# Patient Record
Sex: Female | Born: 1965
Health system: Southern US, Community
[De-identification: ages and names within clinical notes are randomized; demographics above are authoritative.]

## PROBLEM LIST (undated history)

## (undated) DIAGNOSIS — M1990 Unspecified osteoarthritis, unspecified site: Secondary | ICD-10-CM

## (undated) DIAGNOSIS — R51 Headache: Secondary | ICD-10-CM

## (undated) DIAGNOSIS — E785 Hyperlipidemia, unspecified: Secondary | ICD-10-CM

## (undated) DIAGNOSIS — T7840XA Allergy, unspecified, initial encounter: Secondary | ICD-10-CM

## (undated) HISTORY — DX: Allergy, unspecified, initial encounter: T78.40XA

## (undated) HISTORY — DX: Headache: R51

## (undated) HISTORY — DX: Hyperlipidemia, unspecified: E78.5

## (undated) HISTORY — DX: Unspecified osteoarthritis, unspecified site: M19.90

---

## 1998-10-23 ENCOUNTER — Ambulatory Visit (HOSPITAL_COMMUNITY): Admission: RE | Admit: 1998-10-23 | Discharge: 1998-10-23 | Payer: Self-pay | Admitting: *Deleted

## 1999-03-30 ENCOUNTER — Encounter (HOSPITAL_COMMUNITY): Admission: AD | Admit: 1999-03-30 | Discharge: 1999-04-07 | Payer: Self-pay | Admitting: *Deleted

## 1999-04-06 ENCOUNTER — Inpatient Hospital Stay (HOSPITAL_COMMUNITY): Admission: AD | Admit: 1999-04-06 | Discharge: 1999-04-06 | Payer: Self-pay | Admitting: *Deleted

## 1999-04-06 ENCOUNTER — Encounter: Payer: Self-pay | Admitting: *Deleted

## 1999-04-07 ENCOUNTER — Encounter (INDEPENDENT_AMBULATORY_CARE_PROVIDER_SITE_OTHER): Payer: Self-pay

## 1999-04-07 ENCOUNTER — Inpatient Hospital Stay (HOSPITAL_COMMUNITY): Admission: RE | Admit: 1999-04-07 | Discharge: 1999-04-09 | Payer: Self-pay | Admitting: Obstetrics & Gynecology

## 1999-04-13 ENCOUNTER — Encounter: Admission: RE | Admit: 1999-04-13 | Discharge: 1999-04-13 | Payer: Self-pay | Admitting: Obstetrics & Gynecology

## 1999-07-22 ENCOUNTER — Encounter: Admission: RE | Admit: 1999-07-22 | Discharge: 1999-07-22 | Payer: Self-pay | Admitting: Obstetrics

## 1999-08-02 ENCOUNTER — Ambulatory Visit (HOSPITAL_COMMUNITY): Admission: RE | Admit: 1999-08-02 | Discharge: 1999-08-02 | Payer: Self-pay | Admitting: Obstetrics

## 1999-08-26 ENCOUNTER — Encounter: Admission: RE | Admit: 1999-08-26 | Discharge: 1999-08-26 | Payer: Self-pay | Admitting: Obstetrics

## 2000-06-20 ENCOUNTER — Encounter: Payer: Self-pay | Admitting: *Deleted

## 2000-06-20 ENCOUNTER — Inpatient Hospital Stay (HOSPITAL_COMMUNITY): Admission: RE | Admit: 2000-06-20 | Discharge: 2000-06-20 | Payer: Self-pay | Admitting: *Deleted

## 2000-07-11 ENCOUNTER — Encounter (HOSPITAL_COMMUNITY): Admission: RE | Admit: 2000-07-11 | Discharge: 2000-08-10 | Payer: Self-pay | Admitting: *Deleted

## 2000-07-18 ENCOUNTER — Inpatient Hospital Stay (HOSPITAL_COMMUNITY): Admission: RE | Admit: 2000-07-18 | Discharge: 2000-07-19 | Payer: Self-pay | Admitting: *Deleted

## 2000-08-15 ENCOUNTER — Encounter (HOSPITAL_COMMUNITY): Admission: RE | Admit: 2000-08-15 | Discharge: 2000-09-14 | Payer: Self-pay | Admitting: *Deleted

## 2000-09-12 ENCOUNTER — Encounter: Payer: Self-pay | Admitting: *Deleted

## 2000-09-26 ENCOUNTER — Encounter (HOSPITAL_COMMUNITY): Admission: RE | Admit: 2000-09-26 | Discharge: 2000-10-26 | Payer: Self-pay | Admitting: *Deleted

## 2000-10-31 ENCOUNTER — Encounter (HOSPITAL_COMMUNITY): Admission: RE | Admit: 2000-10-31 | Discharge: 2000-11-30 | Payer: Self-pay | Admitting: *Deleted

## 2000-11-28 ENCOUNTER — Encounter: Payer: Self-pay | Admitting: *Deleted

## 2000-12-05 ENCOUNTER — Encounter (HOSPITAL_COMMUNITY): Admission: RE | Admit: 2000-12-05 | Discharge: 2001-01-04 | Payer: Self-pay | Admitting: *Deleted

## 2001-01-09 ENCOUNTER — Encounter (HOSPITAL_COMMUNITY): Admission: RE | Admit: 2001-01-09 | Discharge: 2001-01-16 | Payer: Self-pay | Admitting: *Deleted

## 2001-01-09 ENCOUNTER — Encounter: Payer: Self-pay | Admitting: *Deleted

## 2001-01-23 ENCOUNTER — Encounter (HOSPITAL_COMMUNITY): Admission: RE | Admit: 2001-01-23 | Discharge: 2001-02-01 | Payer: Self-pay | Admitting: *Deleted

## 2001-01-30 ENCOUNTER — Encounter: Payer: Self-pay | Admitting: *Deleted

## 2001-01-31 ENCOUNTER — Inpatient Hospital Stay (HOSPITAL_COMMUNITY): Admission: AD | Admit: 2001-01-31 | Discharge: 2001-02-03 | Payer: Self-pay | Admitting: Obstetrics

## 2001-01-31 ENCOUNTER — Encounter (INDEPENDENT_AMBULATORY_CARE_PROVIDER_SITE_OTHER): Payer: Self-pay | Admitting: Specialist

## 2001-02-13 ENCOUNTER — Inpatient Hospital Stay (HOSPITAL_COMMUNITY): Admission: AD | Admit: 2001-02-13 | Discharge: 2001-02-13 | Payer: Self-pay | Admitting: *Deleted

## 2001-02-27 ENCOUNTER — Encounter (INDEPENDENT_AMBULATORY_CARE_PROVIDER_SITE_OTHER): Payer: Self-pay

## 2001-02-27 ENCOUNTER — Inpatient Hospital Stay (HOSPITAL_COMMUNITY): Admission: AD | Admit: 2001-02-27 | Discharge: 2001-02-27 | Payer: Self-pay | Admitting: *Deleted

## 2001-10-01 ENCOUNTER — Encounter: Payer: Self-pay | Admitting: Family Medicine

## 2001-10-01 ENCOUNTER — Encounter: Admission: RE | Admit: 2001-10-01 | Discharge: 2001-10-01 | Payer: Self-pay | Admitting: Family Medicine

## 2002-07-10 ENCOUNTER — Encounter: Admission: RE | Admit: 2002-07-10 | Discharge: 2002-07-10 | Payer: Self-pay | Admitting: Family Medicine

## 2002-07-10 ENCOUNTER — Encounter: Payer: Self-pay | Admitting: Family Medicine

## 2002-10-18 ENCOUNTER — Encounter: Payer: Self-pay | Admitting: Family Medicine

## 2002-10-18 ENCOUNTER — Ambulatory Visit (HOSPITAL_COMMUNITY): Admission: RE | Admit: 2002-10-18 | Discharge: 2002-10-18 | Payer: Self-pay | Admitting: Family Medicine

## 2003-05-04 ENCOUNTER — Emergency Department (HOSPITAL_COMMUNITY): Admission: AD | Admit: 2003-05-04 | Discharge: 2003-05-04 | Payer: Self-pay | Admitting: Family Medicine

## 2005-12-27 ENCOUNTER — Ambulatory Visit: Payer: Self-pay | Admitting: Family Medicine

## 2005-12-27 ENCOUNTER — Other Ambulatory Visit: Admission: RE | Admit: 2005-12-27 | Discharge: 2005-12-27 | Payer: Self-pay | Admitting: Family Medicine

## 2005-12-27 LAB — CONVERTED CEMR LAB
Chol/HDL Ratio, serum: 3.1
Glucose, Bld: 90 mg/dL (ref 70–99)
HDL: 65.6 mg/dL (ref >39.0)
LDL DIRECT: 125.7 mg/dL
RDW: 12.6 % (ref 11.5–14.6)
TSH: 0.49 microintl units/mL (ref 0.35–5.50)
Triglyceride fasting, serum: 36 mg/dL (ref 0–149)
VLDL: 7 mg/dL (ref 0–40)
WBC: 3.9 10*3/uL — ABNORMAL LOW (ref 4.5–10.5)

## 2007-03-07 ENCOUNTER — Telehealth (INDEPENDENT_AMBULATORY_CARE_PROVIDER_SITE_OTHER): Payer: Self-pay | Admitting: *Deleted

## 2007-10-10 ENCOUNTER — Encounter (INDEPENDENT_AMBULATORY_CARE_PROVIDER_SITE_OTHER): Payer: Self-pay | Admitting: Obstetrics and Gynecology

## 2007-10-10 ENCOUNTER — Ambulatory Visit (HOSPITAL_COMMUNITY): Admission: RE | Admit: 2007-10-10 | Discharge: 2007-10-10 | Payer: Self-pay | Admitting: Obstetrics and Gynecology

## 2007-11-12 ENCOUNTER — Ambulatory Visit: Payer: Self-pay | Admitting: Family Medicine

## 2008-02-20 ENCOUNTER — Ambulatory Visit: Payer: Self-pay | Admitting: Family Medicine

## 2009-01-07 ENCOUNTER — Ambulatory Visit: Payer: Self-pay | Admitting: Family Medicine

## 2009-07-08 ENCOUNTER — Ambulatory Visit: Payer: Self-pay | Admitting: Family Medicine

## 2009-07-08 ENCOUNTER — Other Ambulatory Visit: Admission: RE | Admit: 2009-07-08 | Discharge: 2009-07-08 | Payer: Self-pay | Admitting: Family Medicine

## 2009-07-08 DIAGNOSIS — N951 Menopausal and female climacteric states: Secondary | ICD-10-CM | POA: Insufficient documentation

## 2009-07-08 DIAGNOSIS — B359 Dermatophytosis, unspecified: Secondary | ICD-10-CM | POA: Insufficient documentation

## 2009-07-08 DIAGNOSIS — K5900 Constipation, unspecified: Secondary | ICD-10-CM | POA: Insufficient documentation

## 2009-07-09 LAB — CONVERTED CEMR LAB
Albumin: 3.6 g/dL (ref 3.5–5.2)
BUN: 15 mg/dL (ref 6–23)
Basophils Relative: 0.4 % (ref 0.0–3.0)
Bilirubin, Direct: 0.2 mg/dL (ref 0.0–0.3)
CO2: 31 meq/L (ref 19–32)
Chloride: 103 meq/L (ref 96–112)
Cholesterol: 221 mg/dL — ABNORMAL HIGH (ref 0–200)
Creatinine, Ser: 0.7 mg/dL (ref 0.4–1.2)
Direct LDL: 128.9 mg/dL
Eosinophils Absolute: 0.1 10*3/uL (ref 0.0–0.7)
MCHC: 34.3 g/dL (ref 30.0–36.0)
MCV: 95 fL (ref 78.0–100.0)
Monocytes Absolute: 0.3 10*3/uL (ref 0.1–1.0)
Neutrophils Relative %: 46.6 % (ref 43.0–77.0)
RBC: 3.89 M/uL (ref 3.87–5.11)
TSH: 0.41 microintl units/mL (ref 0.35–5.50)
Total CHOL/HDL Ratio: 3
Total Protein: 7.6 g/dL (ref 6.0–8.3)
Vit D, 25-Hydroxy: 59 ng/mL (ref 30–89)

## 2009-07-13 ENCOUNTER — Encounter (INDEPENDENT_AMBULATORY_CARE_PROVIDER_SITE_OTHER): Payer: Self-pay | Admitting: *Deleted

## 2010-02-18 NOTE — Letter (Signed)
Summary: Results Follow up Letter  [facility]  [address] [place], [place] [address]   Phone: [phone]  Fax: [phone]    07/13/2009 MRN: [id number]  Mary [name] [address] [place], [place]  [address]  Dear [name],  The following are the results of your recent test(s):  Test         Result    Pap Smear:        Normal __X___  Not Normal _____ Comments: ______________________________________________________ Cholesterol: LDL(Bad cholesterol):         Your goal is less than:         HDL (Good cholesterol):       Your goal is more than: Comments:  ______________________________________________________ Mammogram:        Normal _____  Not Normal _____ Comments:  ___________________________________________________________________ Hemoccult:        Normal _____  Not normal _______ Comments:    _____________________________________________________________________ Other Tests:    We routinely do not discuss normal results over the telephone.  If you desire a copy of the results, or you have any questions about this information we can discuss them at your next office visit.   Sincerely,

## 2010-02-18 NOTE — Assessment & Plan Note (Signed)
Summary: cpx & lab/cbs   Vital Signs:  Patient profile:   45 year old female Height:      65 inches Weight:      168 pounds BMI:     28.06 Pulse rate:   60 / minute BP sitting:   120 / 68  (left arm)  Vitals Entered By: Doristine Devoid (July 08, 2009 8:06 AM) CC: CPX AND LAB  W/ PAP   History of Present Illness: 45 yo woman here today for CPE.    irregular menses- LMP March, prior to that was Feb.  having hot flashes- occuring at night, almost daily.  has seen GYN at [facility] ([place] to discuss this but they 'did not give me anything for this'.  pt was told she was perimenopausal, not sure when mom had menopause.  constipation- using OTC stool softeners w/ some relief.  having BMs every other day.  not exercising regularly.  poor fluid intake.  ? fiber intake.  has never tried miralax.  rash- pt reports itching on shoulders, legs.  no one in family w/ similar.  area present x1 month.    Preventive Screening-Counseling & Management  Alcohol-Tobacco     Alcohol drinks/day: 0     Smoking Status: never  Caffeine-Diet-Exercise     Does Patient Exercise: no      Sexual History:  currently monogamous.        Drug Use:  never.    Current Medications (verified): 1)  Propranolol Hcl 40 Mg Tabs (Propranolol Hcl) .Marland Kitchen.. 1 Tab By Mouth Two Times A Day.  Allergies (verified): No Known Drug Allergies  Past History:  Past Medical History: migraines- sees neuro, on propranolol  Past Surgical History: Csection x2 1 ectopic surgery ? 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    Physical Exam  General:  Well-developed,well-nourished,in no acute distress; alert,appropriate and cooperative throughout examination Head:  Normocephalic and atraumatic without obvious abnormalities. No apparent alopecia or balding. Eyes:  No corneal or conjunctival inflammation noted. EOMI. Perrla. Funduscopic exam benign, without hemorrhages, exudates or papilledema. Vision grossly normal. Ears:  External ear exam shows no significant lesions or deformities.  Otoscopic examination reveals clear canals, tympanic membranes are intact bilaterally without bulging, retraction, inflammation or discharge. Hearing is grossly normal bilaterally. Nose:  External nasal examination shows no deformity or inflammation. Nasal mucosa are pink and moist without lesions or exudates. Mouth:  Oral mucosa and oropharynx without lesions or exudates.  Teeth in good repair. Neck:  No deformities, masses, or tenderness noted. Breasts:  No mass, nodules, thickening, tenderness, bulging, retraction, inflamation, nipple discharge or skin changes noted.   Lungs:  Normal respiratory effort, chest expands symmetrically. Lungs are clear to auscultation, no crackles or wheezes. Heart:  Normal rate and regular rhythm. S1 and S2 normal without gallop, murmur, click, rub or other extra sounds. Abdomen:  Bowel sounds positive,abdomen soft and non-tender without masses, organomegaly or hernias noted. Genitalia:  Pelvic Exam:        External: normal female genitalia  without lesions or masses        Vagina: normal without lesions or masses        Cervix: normal without lesions or masses        Adnexa: normal bimanual exam without masses or fullness        Uterus: normal by palpation        Pap smear: performed Msk:  No deformity or scoliosis noted of  thoracic or lumbar spine.   Pulses:  +2 carotid, radial, DP Extremities:  No clubbing, cyanosis, edema, or deformity noted with normal full range of motion of all joints.   Neurologic:  No cranial nerve deficits noted. Station and gait are normal. Plantar reflexes are down-going bilaterally. DTRs are symmetrical throughout. Sensory, motor and coordinative functions appear intact. Skin:  area on R shoulder and lower legs consistent w/ ringworm Cervical Nodes:  No lymphadenopathy noted Axillary Nodes:  No palpable lymphadenopathy Psych:  Cognition and judgment appear intact. Alert and cooperative with normal attention span and concentration. No apparent delusions, illusions, hallucinations   Impression & Recommendations:  Problem # 1:  ROUTINE GYNECOLOGICAL EXAMINATION (ICD-V72.31) Assessment New pt's PE WNL w/ exception of ringworm (see below).  check labs.  encouraged regular exercise, healthy food choices. Orders: Venipuncture (84132) T-Vitamin D (25-Hydroxy) (44010-27253) TLB-Lipid Panel (80061-LIPID) TLB-BMP (Basic Metabolic Panel-BMET) (80048-METABOL) TLB-CBC Platelet - w/Differential (85025-CBCD) TLB-Hepatic/Liver Function Pnl (80076-HEPATIC) TLB-TSH (Thyroid Stimulating Hormone) (84443-TSH)  Problem # 2:  SCREENING FOR MALIGNANT NEOPLASM OF THE CERVIX (ICD-V76.2) Assessment: New pap collected  Problem # 3:  HOT FLASHES (ICD-627.2) Assessment: New pt to start OTC black cohash for sxs.  if no improvement will refer to GYN for discussion on hormone replacement.  Pt expresses understanding and is in agreement w/ this plan.  Problem # 4:  RINGWORM (ICD-110.9) Assessment: New start OTC antifungal.  cautioned pt that this is contagious and reviewed how to recognize similar in her family members.  Pt expresses understanding and is in agreement w/ this plan.  Problem # 5:  CONSTIPATION (ICD-564.00) Assessment: New continue OTC stool softener, Miralax as needed, increase fiber  intake, water consumption, regular exercise.  Pt expresses understanding and is in agreement w/ this plan.  Complete Medication List: 1)  Propranolol Hcl 40 Mg Tabs (Propranolol hcl) .Marland Kitchen.. 1 tab by mouth two times a day.  Patient Instructions: 1)  Follow up in 1 year or as needed 2)  We'll notify you of your lab results 3)  Use Clotrimazole cream (over the counter) two times a day on the spots on your skin- this appears to be ringworm.  Don't be surprised if your children also get it (you can treat them the same way) 4)  Use a stool softener daily for your constipation 5)  Miralax as needed 6)  Increase your fiber intaker- fruits, vegetables, whole grains 7)  Increase your water intake and try and get regular exercise to regulate your bowels 8)  Start Black Cohash (available over the counter)- take as directed on the bottle 9)  If your hot flashes don't improve- please call 10)  Have a great summer!

## 2010-02-26 ENCOUNTER — Encounter: Payer: Self-pay | Admitting: Family Medicine

## 2010-03-10 NOTE — Letter (Signed)
Summary: [facility]  [facility]   Imported By: Maryln Gottron 03/03/2010 14:14:42  _____________________________________________________________________  External Attachment:    Type:   Image     Comment:   External Document

## 2010-06-01 NOTE — Op Note (Signed)
NAME:  Mary [name], Mary [name]             ACCOUNT NO.:  [id number]   MEDICAL RECORD NO.:  [id number]          PATIENT TYPE:  AMB   LOCATION:  SDC                           FACILITY:  [facility]   PHYSICIAN:  Maxie Better, M.D.DATE OF BIRTH:  [birth year]   DATE OF PROCEDURE:  10/10/2007  DATE OF DISCHARGE:                               OPERATIVE REPORT   PREOPERATIVE DIAGNOSES:  1. Menorrhagia.  2. Endometrial polyps.   PROCEDURE:  1. Diagnostic hysteroscopy.  2. Hysteroscopic resection of endometrial polyps.  3. Dilatation and curettage.   POSTOPERATIVE DIAGNOSES:  1. Endometrial polyps.  2. Menorrhagia.   ANESTHESIA:  General paracervical block.   SURGEON:  Maxie Better, MD   PROCEDURE IN DETAIL:  Under adequate general anesthesia, the patient was  placed in the dorsal lithotomy position.  Examination under anesthesia  revealed an anteverted/anteflexed uterus.  No adnexal masses could be  appreciated.  The patient was sterilely prepped and draped in usual  fashion.  The bladder was catheterized for moderate amount of urine.  A  bivalve speculum was placed in the vagina.  A 10 mL of 1% Nesacaine was  injected paracervically at 3 o'clock and 9 o'clock.  The anterior lip of  the cervix was grasped with a single-tooth tenaculum.  The cervix was  serially dilated up to #25 [facility] dilator.  A diagnostic hysteroscope was  introduced into the uterine cavity. Both tubal ostia could be seen.  Polypoid lesions were noted particularly fundally and posteriorly.  The  hysteroscope was removed.  The cervix was then dilated up further on  Pratt dilator.  Resectoscope was introduced.  The polypoid lesions were  resected.  The resectoscope was then removed and the cavity curetted.  Moderate amount of tissue was obtained.  The resectoscope was  reinserted.  No further polypoid lesions were noted.  The procedure was  felt to be complete.  All instruments were then removed from the vagina.  Specimens  labeled endometrial curettings and endometrial polyps  were sent to Pathology.  Estimated blood loss was minimal.  Fluid  deficit was 200 mL.  Complication was none.  The patient tolerated the  procedure well and was transferred to the recovery room in stable  condition.      Maxie Better, M.D.  Electronically Signed    [place]/MEDQ  D:  10/10/2007  T:  10/11/2007  Job:  [id number]

## 2010-06-04 NOTE — Op Note (Signed)
[facility]  Patient:    Mary [name], Mary [name] Visit Number: [id number] MRN: [id number]          Service Type: OBS Location: 910A 9105 01 Attending Physician:  Tammi Sou Dictated by:   Bing Neighbors Clearance Coots, M.D. Proc. Date: 01/31/01 Admit Date:  01/31/2001                             Operative Report  PREOPERATIVE DIAGNOSES:       1. Gestation at 37 weeks.                               2. Previous cornual resection of right ectopic                                  pregnancy.                               3. Positive fetal lung maturity studies on                                  amniocentesis.                               4. Previous cesarean section.                               5. Desires sterilization.  POSTOPERATIVE DIAGNOSES:      1. Gestation at 37 weeks.                               2. Previous cornual resection of right ectopic                                  pregnancy.                               3. Positive fetal lung maturity studies on                                  amniocentesis.                               4. Previous cesarean section.                               5. Desires sterilization.  PROCEDURES:                   1. Repeat low transverse cesarean section.                               2. Left unilateral partial salpingectomy.  SURGEON:  Charles A. Clearance Coots, M.D.  ASSISTANT:                    Ed Blalock. Burnadette Peter, M.D.  ANESTHESIA:                   Spinal.  ESTIMATED BLOOD LOSS:         800 ml.  IV FLUIDS:                    3500 ml.  URINE OUTPUT:                 200 ml clear.  COMPLICATIONS:                None.  DRAINS:                       Foley to gravity.  FINDINGS:                     Viable female at 11:15.  Apgars of 9 at one minute and 9 at five minutes.  Weight 7 lb 2 oz.  Normal uterus, ovaries and fallopian tubes.  DESCRIPTION OF PROCEDURE:     The patient was  brought to the operating room and, after satisfactory spinal anesthesia, the abdomen was prepped and draped in the usual sterile fashion.  The patient had large keloid formation from her previous Pfannenstiel incision and desired removal of this keloid scar with her current cesarean section.  The keloid was therefore excised sharply with a scalpel in an elliptical fashion.  The incision was then continued down to the fascia and the fascia was nicked in the midline.  The fascial incision was extended to the left and to the right with curved Mayo scissors.  The superior and inferior fascial edges were sharply separated from the aponeurosis of the rectus muscle with curved Mayo scissors.  The rectus muscle was divided in the midline sharply superiorly and inferiorly, being careful to avoid the urinary bladder inferiorly.  The peritoneum was then grasped with hemostats and was incised with Metzenbaum scissors.  The peritoneal incision was extended superiorly and inferiorly, being careful to avoid the urinary bladder inferiorly.  A bladder blade was positioned and vesicouterine fold of the peritoneum above the reflection of the urinary bladder was grasped with forceps and was incised and undermined with Metzenbaum scissors.  The incision was extended to the left and to the right with the Metzenbaum scissors.  A bladder flap was bluntly developed and the bladder blade was repositioned in front of the urinary bladder, placing it well out of the operative field.  The uterus was then entered in the lower uterine segment transversely with a scalpel.  Clean amniotic fluid was expelled.  The uterine incision was then extended to the left and to the right with bandage scissors in a smile configuration.  The vertex was then delivered with the aid of fundal pressure from the assistant.  The infants mouth and nose was suctioned with the suction bulb and delivery was completed with the aid of fundal pressure  from the assistant.  The umbilical cord was doubly clamped and cut and the infant was handed off to the nursery staff.  The placenta was spontaneously expelled from the uterine cavity intact after cord blood was obtained.  The uterus was exteriorized and the endometrial surface was thoroughly debrided with a dry lap sponge.  The edges of the uterine  incision were grasped with ring forceps and the uterus was closed with continuous interlocking suture of 0 Monocryl from each corner to the center.  Hemostasis was excellent.  Attention was then turned above for the tubal ligation procedure.  The tube on the right side was absent.  The left fallopian tube was grasped with a Babcock clamp in the isthmic area of the tube.  The tube was identified from the cornual end to the fimbrial end while in the grasp of the Babcock clamp.  A knuckle of tube beneath the Babcock clamp in the isthmic area of the tube was doubly ligated with #1 plain catgut and the section of tube above the knot was excised with Metzenbaum scissors and submitted to pathology for evaluation.  There was no active bleeding from the tubal stump.  The uterus was then placed back in its normal anatomic position.  The pelvic cavity was thoroughly irrigated with warm saline solution and all clots were removed.  Closure of the uterus was again observed for hemostasis and there was no active bleeding noted.  The abdomen was then closed as follows.  The rectus muscle was approximated with a few interrupted sutures of 2-0 Monocryl.  The fascia was closed with a continuous suture of 0 PDS from each corner to the center.  The subcutaneous tissue was thoroughly irrigated with warm saline solution.  All areas of subcutaneous bleeding were coagulated with the Bovie.  A running subcutaneous suture of 3-0 plain cat gut was then placed for support of the skin closure. Steri-Strips were then applied to the incisional closure.  The subcutaneous area  was then injected with 10 mg of Triamcinolone solution diluted in 10 ml of normal saline.  A sterile pressure bandage was applied to the incisional  closure.  The surgical technician indicated that all sponge, needle and instrument counts were correct.  The patient tolerated the procedure well and was transported to the recovery room in satisfactory condition. Dictated by:   Bing Neighbors Clearance Coots, M.D. Attending Physician:  Tammi Sou DD:  01/31/01 TD:  01/31/01 Job: [id number]

## 2010-06-04 NOTE — Discharge Summary (Signed)
[facility]  Patient:    Mary [name], Mary [name] Visit Number: [id number] MRN: [id number]          Service Type: OBS Location: 910A 9105 01 Attending Physician:  Tammi Sou Dictated by:   Ocie Doyne, M.D. Admit Date:  01/31/2001 Discharge Date: 02/03/2001                             Discharge Summary  DATE OF BIRTH:                22-Mar-1965  ADMISSION DIAGNOSES:          1. Intrauterine pregnancy at term.                               2. Patient desire for repeat low transverse                                  cesarean section.  DISCHARGE DIAGNOSES:          1. Repeat low transverse cesarean section.                               2. Delivery of a viable female infant at term.                               3. Left unilateral partial salpingectomy.  DISCHARGE MEDICATIONS:        1. Ibuprofen 600 mg p.o. q.6h. p.r.n. pain.                               2. Tylox 1-2 p.o. q.6h. p.r.n. pain #20 with no                                  refill.                               3. Prenatal vitamin 1 p.o. q.d. x6 weeks.  WOUND CARE:                   Per instruction in booklet.  FOLLOWUP:                     [facility] March 14, 2001 at 3:30 p.m.  ADMISSION HISTORY AND PHYSICAL:                     This 45 year old G5, P1-2-1-1 at [redacted] weeks gestation with an amnio confirming fetal lung maturity presented for repeat cesarean section.  She has previously undergone right salpingectomy for an ectopic pregnancy and desired to have operative sterilization.  At admission, she was afebrile, vitals were stable, and her cervix was long and closed.  HOSPITAL COURSE:              The patient underwent a repeat low transverse cesarean section and left unilateral partial salpingectomy under spinal anesthesia with no complications.  She delivered a viable female infant weighing 7 pounds 2 ounces with Apgars of 9  and 9 at 1 and 5 minutes.  Her  postpartum course was unremarkable.  She was ambulating well, eating without difficulty, passing some flatus, breast feeding without difficulty and her wound was clean, dry, and intact.  She had adequate analgesia from oral medications and her condition at discharge is stable.  She will follow up in six weeks at [facility] as scheduled. Dictated by:   Ocie Doyne, M.D. Attending Physician:  Tammi Sou DD:  02/03/01 TD:  02/05/01 Job: [id number]

## 2010-06-04 NOTE — Op Note (Signed)
[facility]  Patient:    MALORY, SPURR                      MRN: [id number] Proc. Date: 04/07/99 Adm. Date:  [id number] Disc. Date: [id number] Attending:  Michaelle Copas CC:         [facility]                           Operative Report  PREOPERATIVE DIAGNOSIS:       Right cornual ectopic pregnancy.  POSTOPERATIVE DIAGNOSIS:      Right cornual ectopic pregnancy with left ovarian  cyst.  OPERATION:                    Right cornual ectopic resection with right salpingectomy and incision and drainage of left ovarian cyst.  SURGEON:                      Roseanna Rainbow, M.D.  ASSISTANT:  ANESTHESIA:                   General endotracheal anesthesia.  COMPLICATIONS:                None.  ESTIMATED BLOOD LOSS:         Less than 100 cc.  FLUIDS:                       1 liter of Ringers lactate.  URINE OUTPUT:                 200 cc clear urine at the end of the procedure.  FINDINGS:                     Diffusely enlarged uterus with dilatation of the right cornu.  There is a left-sided simple appearing unilocular cyst that was approximately 2 to 3 cm in diameter.  The left tube appeared within normal limits.  DESCRIPTION OF PROCEDURE:     The risks, benefits, indications, and alternatives of the procedure were reviewed with the patient and informed consent was obtained.  The patient was taken to the operating room where she was placed in the supine position, given general anesthesia, and prepped and draped in the usual sterile  fashion.  A Pfannenstiel skin incision was then made through the previous scar nd extended to the rectus fascia.  The fascia was incised bilaterally with curved ayo scissors and the muscles of the anterior abdominal wall were separated in the midline.  The parietoperitoneum was elevated and entered sharply.  The pelvis was examined with the above findings.  An OConnor-OSullivan retractor  was then placed into the incision and the bowel packed away with moistened laparotomy sponges. A dilute vasopressin solution was then injected at the base of the ectopic in a linear fashion over the gestational sac.  Allis forceps were placed on the muscle edges for traction and the heel of the scalpel was used to shell out the pregnancy. The defect was then reapproximated with horizontal mattress sutures using 0 Monocryl.  The serosa was reapproximated using 3-0 Monocryl.  A right salpingectomy was then performed using Kelly clamps to clamp across the mesosalpinx and the intervening tube was excised.  The Kelly clamps were then secured with transfixing sutures of 2-0 Monocryl.  Excellent hemostasis was assured.  The ovarian cyst on  the left was then punctured on the antimesenteric side and drained of clear fluid. The pelvis was then irrigated copiously with saline and some saline was left in the pelvis so that the adnexa could be free floating.  All laparotomy sponges and instruments were removed from the abdomen.  The fascia was reapproximated with  PDS.  The previous scar that was a keloid that was about 0.5 cm in diameter was  excised.  The skin was then closed with staples.  Sponge, needle, and instrument counts were correct x 2.  The patient was taken to the PACU in stable condition. DD:  04/07/99 TD:  04/08/99 Job: [id number]

## 2010-06-04 NOTE — Op Note (Signed)
[facility]  Patient:    Mary [name], Mary [name]                      MRN: [id number] Proc. Date: 07/18/00 Adm. Date:  [id number] Attending:  Michaelle Copas                           Operative Report  PREOPERATIVE DIAGNOSIS:       Intrauterine pregnancy at [redacted] weeks gestation with decreased cervical resistance and positive Group B Strep.  POSTOPERATIVE DIAGNOSIS:      Intrauterine pregnancy at [redacted] weeks gestation with decreased cervical resistance and positive Group B Strep.  OPERATION:                    Cervical cerclage.  SURGEON:                      Conni Elliot, M.D.  ASSISTANT:  ANESTHESIA:                   Spinal.  ESTIMATED BLOOD LOSS:  DESCRIPTION OF PROCEDURE:     After placing the patient under spinal anesthetic with the patient supine in the left lateral tilt position, the patient was placed in the dorsal lithotomy position, the perineum and vagina were prepped and draped with Betadine solution.  The weighted speculum was placed in the posterior vagina.  Anterior cervix was grasped after placing anterior Deaver.  A cervical cerclage was placed using #4 silk double-stranded starting at 12 oclock and then going counterclockwise and tied at 12 oclock. Estimated blood loss was less than 10 cc.  Sponge, needle, and instrument counts were correct. DD:  07/18/00 TD:  07/18/00 Job: [id number]

## 2010-06-04 NOTE — Discharge Summary (Signed)
[facility]  Patient:    Mary [name], Mary [name]                      MRN: [id number] Adm. Date:  [id number] Disc. Date: [id number] Attending:  Antionette Char                           Discharge Summary  DISCHARGE DIAGNOSES:          1. Corneal ectopic pregnancy.                               2. Status post right salpingectomy, corneal wedge                                  resection.  BRIEF HOSPITAL COURSE:        Patient was admitted to the hospital with corneal  ectopic pregnancy.  She was taken to the OR for excision.  Patient tolerated the procedure very well by Dr. Tamela Oddi.  She has been observed in the hospital, placed on IV antibiotics, IV pain medicines, and IV fluids.  Patient gradually improved, was doing significantly better, was ambulating, taking good p.o., and  ready to be discharged home.  DISCHARGE MEDICATIONS:        Ibuprofen 800 mg p.o. q.8h. p.r.n.  INSTRUCTIONS:  ACTIVITY:                     Patient was instructed to restrict activity for six weeks, no heavy lifting, exertion, driving.  DIET:                         Return to normal diet.  WOUND CARE:                   Standard wound care booklet.  FOLLOW-UP:                    She is to return to [facility]U for staple removal on March 27.  If she has any worsening symptoms she is also to notify or return to  MAU.  DISPOSITION:                  Discharge home.  DISCHARGE CONDITION:          Stable. DD:  04/09/99 TD:  04/10/99 Job: [id number]

## 2010-10-18 LAB — CBC
HCT: 38.2
Hemoglobin: 12.8
MCHC: 33.4
RBC: 4.03

## 2011-01-12 ENCOUNTER — Ambulatory Visit (INDEPENDENT_AMBULATORY_CARE_PROVIDER_SITE_OTHER): Payer: Managed Care, Other (non HMO) | Admitting: *Deleted

## 2011-01-12 DIAGNOSIS — Z23 Encounter for immunization: Secondary | ICD-10-CM

## 2011-09-21 ENCOUNTER — Encounter: Payer: Managed Care, Other (non HMO) | Admitting: Family Medicine

## 2011-12-05 ENCOUNTER — Other Ambulatory Visit (HOSPITAL_COMMUNITY)
Admission: RE | Admit: 2011-12-05 | Discharge: 2011-12-05 | Disposition: A | Discharge status: Home / Self Care | Payer: Managed Care, Other (non HMO) | Source: Ambulatory Visit | Attending: Family Medicine | Admitting: Family Medicine

## 2011-12-05 ENCOUNTER — Ambulatory Visit (INDEPENDENT_AMBULATORY_CARE_PROVIDER_SITE_OTHER): Payer: Managed Care, Other (non HMO) | Admitting: Family Medicine

## 2011-12-05 ENCOUNTER — Encounter: Payer: Self-pay | Admitting: Family Medicine

## 2011-12-05 VITALS — BP 112/76 | HR 58 | Temp 98.2°F | Wt 175.0 lb

## 2011-12-05 DIAGNOSIS — Z1231 Encounter for screening mammogram for malignant neoplasm of breast: Secondary | ICD-10-CM

## 2011-12-05 DIAGNOSIS — Z01419 Encounter for gynecological examination (general) (routine) without abnormal findings: Secondary | ICD-10-CM | POA: Insufficient documentation

## 2011-12-05 DIAGNOSIS — N912 Amenorrhea, unspecified: Secondary | ICD-10-CM

## 2011-12-05 DIAGNOSIS — Z23 Encounter for immunization: Secondary | ICD-10-CM

## 2011-12-05 DIAGNOSIS — Z0000 Encounter for general adult medical examination without abnormal findings: Secondary | ICD-10-CM

## 2011-12-05 DIAGNOSIS — Z124 Encounter for screening for malignant neoplasm of cervix: Secondary | ICD-10-CM

## 2011-12-05 DIAGNOSIS — Z1239 Encounter for other screening for malignant neoplasm of breast: Secondary | ICD-10-CM

## 2011-12-05 LAB — TSH: TSH: 0.36 u[IU]/mL (ref 0.35–5.50)

## 2011-12-05 LAB — BASIC METABOLIC PANEL
BUN: 14 mg/dL (ref 6–23)
Calcium: 8.9 mg/dL (ref 8.4–10.5)
GFR: 104.54 mL/min (ref >60.00)
Glucose, Bld: 89 mg/dL (ref 70–99)
Sodium: 138 mEq/L (ref 135–145)

## 2011-12-05 LAB — CBC WITH DIFFERENTIAL/PLATELET
Basophils Relative: 0.4 % (ref 0.0–3.0)
Eosinophils Relative: 1.7 % (ref 0.0–5.0)
HCT: 37.9 % (ref 36.0–46.0)
Lymphs Abs: 1.4 10*3/uL (ref 0.7–4.0)
MCV: 94.8 fl (ref 78.0–100.0)
Monocytes Absolute: 0.3 10*3/uL (ref 0.1–1.0)
Platelets: 158 10*3/uL (ref 150.0–400.0)
WBC: 3.8 10*3/uL — ABNORMAL LOW (ref 4.5–10.5)

## 2011-12-05 LAB — LIPID PANEL
Cholesterol: 235 mg/dL — ABNORMAL HIGH (ref 0–200)
HDL: 83 mg/dL (ref >39.00)
Triglycerides: 49 mg/dL (ref 0.0–149.0)

## 2011-12-05 LAB — LUTEINIZING HORMONE: LH: 42.15 m[IU]/mL

## 2011-12-05 LAB — LDL CHOLESTEROL, DIRECT: Direct LDL: 127.9 mg/dL

## 2011-12-05 LAB — HEPATIC FUNCTION PANEL: Albumin: 3.7 g/dL (ref 3.5–5.2)

## 2011-12-05 NOTE — Assessment & Plan Note (Signed)
Pt's PE WNL.  Overdue on mammo- order placed.  Check labs.  Anticipatory guidance provided.

## 2011-12-05 NOTE — Patient Instructions (Addendum)
Follow up in 1 year or as needed We'll notify you of your lab results and make any changes if needed Someone will call you with your mammo appt Call with any questions or concerns Happy Thanksgiving!

## 2011-12-05 NOTE — Progress Notes (Signed)
  Subjective:    Patient ID: Mary [name], female    DOB: 03/16/1965, 46 y.o.   MRN: [id number]  HPI CPE- no concerns today.  LMP 2/13- was told she was going through early menopause.  Overdue on mammo, pap.   Review of Systems Patient reports no vision/ hearing changes, adenopathy,fever, weight change,  persistant/recurrent hoarseness , swallowing issues, chest pain, palpitations, edema, persistant/recurrent cough, hemoptysis, dyspnea (rest/exertional/paroxysmal nocturnal), gastrointestinal bleeding (melena, rectal bleeding), abdominal pain, significant heartburn, bowel changes, GU symptoms (dysuria, hematuria, incontinence), Gyn symptoms (abnormal  bleeding, pain),  syncope, focal weakness, memory loss, numbness & tingling, skin/hair/nail changes, abnormal bruising or bleeding, anxiety, or depression.     Objective:   Physical Exam  General Appearance:    Alert, cooperative, no distress, appears stated age  Head:    Normocephalic, without obvious abnormality, atraumatic  Eyes:    PERRL, conjunctiva/corneas clear, EOM's intact, fundi    benign, both eyes  Ears:    Normal TM's and external ear canals, both ears  Nose:   Nares normal, septum midline, mucosa normal, no drainage    or sinus tenderness  Throat:   Lips, mucosa, and tongue normal; teeth and gums normal  Neck:   Supple, symmetrical, trachea midline, no adenopathy;    Thyroid: no enlargement/tenderness/nodules  Back:     Symmetric, no curvature, ROM normal, no CVA tenderness  Lungs:     Clear to auscultation bilaterally, respirations unlabored  Chest Wall:    No tenderness or deformity   Heart:    Regular rate and rhythm, S1 and S2 normal, no murmur, rub   or gallop  Breast Exam:    No tenderness, masses, or nipple abnormality  Abdomen:     Soft, non-tender, bowel sounds active all four quadrants,    no masses, no organomegaly  Genitalia:    External genitalia normal, cervix normal in appearance, no CMT, uterus in normal  size and position, adnexa w/out mass or tenderness, mucosa pink and moist, no lesions or discharge present  Rectal:    Normal external appearance  Extremities:   Extremities normal, atraumatic, no cyanosis or edema  Pulses:   2+ and symmetric all extremities  Skin:   Skin color, texture, turgor normal, no rashes or lesions  Lymph nodes:   Cervical, supraclavicular, and axillary nodes normal  Neurologic:   CNII-XII intact, normal strength, sensation and reflexes    throughout          Assessment & Plan:

## 2011-12-05 NOTE — Assessment & Plan Note (Signed)
Pap collected. 

## 2011-12-05 NOTE — Assessment & Plan Note (Signed)
New.  No period since Feb.  Check labs to determine if pt is menopausal.

## 2012-01-17 ENCOUNTER — Ambulatory Visit
Admission: RE | Admit: 2012-01-17 | Discharge: 2012-01-17 | Disposition: A | Discharge status: Home / Self Care | Payer: Managed Care, Other (non HMO) | Source: Ambulatory Visit | Attending: Family Medicine | Admitting: Family Medicine

## 2012-01-17 DIAGNOSIS — Z1231 Encounter for screening mammogram for malignant neoplasm of breast: Secondary | ICD-10-CM

## 2013-02-12 ENCOUNTER — Ambulatory Visit (INDEPENDENT_AMBULATORY_CARE_PROVIDER_SITE_OTHER): Payer: Managed Care, Other (non HMO) | Admitting: *Deleted

## 2013-02-12 DIAGNOSIS — Z23 Encounter for immunization: Secondary | ICD-10-CM

## 2013-04-05 ENCOUNTER — Telehealth: Payer: Self-pay

## 2013-04-05 NOTE — Telephone Encounter (Signed)
Left message for call back Non-identifiable   Pap-12/05/11-normal MMG- 01/17/12-negative Flu-02/12/13 Td-

## 2013-04-09 ENCOUNTER — Ambulatory Visit (INDEPENDENT_AMBULATORY_CARE_PROVIDER_SITE_OTHER): Payer: Managed Care, Other (non HMO) | Admitting: Family Medicine

## 2013-04-09 ENCOUNTER — Encounter: Payer: Self-pay | Admitting: Family Medicine

## 2013-04-09 VITALS — BP 120/84 | HR 59 | Temp 97.8°F | Resp 16 | Ht 66.0 in | Wt 185.4 lb

## 2013-04-09 DIAGNOSIS — Z23 Encounter for immunization: Secondary | ICD-10-CM

## 2013-04-09 DIAGNOSIS — M545 Low back pain, unspecified: Secondary | ICD-10-CM

## 2013-04-09 DIAGNOSIS — G8929 Other chronic pain: Secondary | ICD-10-CM | POA: Insufficient documentation

## 2013-04-09 DIAGNOSIS — Z01419 Encounter for gynecological examination (general) (routine) without abnormal findings: Secondary | ICD-10-CM

## 2013-04-09 DIAGNOSIS — M25569 Pain in unspecified knee: Secondary | ICD-10-CM

## 2013-04-09 DIAGNOSIS — M25561 Pain in right knee: Secondary | ICD-10-CM

## 2013-04-09 DIAGNOSIS — Z1231 Encounter for screening mammogram for malignant neoplasm of breast: Secondary | ICD-10-CM

## 2013-04-09 LAB — HEPATIC FUNCTION PANEL
ALBUMIN: 3.8 g/dL (ref 3.5–5.2)
ALK PHOS: 75 U/L (ref 39–117)
ALT: 22 U/L (ref 0–35)
AST: 23 U/L (ref 0–37)
BILIRUBIN TOTAL: 1 mg/dL (ref 0.3–1.2)
Bilirubin, Direct: 0 mg/dL (ref 0.0–0.3)
Total Protein: 7.9 g/dL (ref 6.0–8.3)

## 2013-04-09 LAB — CBC WITH DIFFERENTIAL/PLATELET
Basophils Absolute: 0 10*3/uL (ref 0.0–0.1)
Basophils Relative: 0.4 % (ref 0.0–3.0)
EOS PCT: 2.3 % (ref 0.0–5.0)
Eosinophils Absolute: 0.1 10*3/uL (ref 0.0–0.7)
HEMATOCRIT: 38.8 % (ref 36.0–46.0)
HEMOGLOBIN: 12.8 g/dL (ref 12.0–15.0)
LYMPHS ABS: 1.7 10*3/uL (ref 0.7–4.0)
Lymphocytes Relative: 43.2 % (ref 12.0–46.0)
MCHC: 33 g/dL (ref 30.0–36.0)
MCV: 94.1 fl (ref 78.0–100.0)
MONO ABS: 0.3 10*3/uL (ref 0.1–1.0)
MONOS PCT: 8.4 % (ref 3.0–12.0)
NEUTROS ABS: 1.8 10*3/uL (ref 1.4–7.7)
Neutrophils Relative %: 45.7 % (ref 43.0–77.0)
Platelets: 162 10*3/uL (ref 150.0–400.0)
RBC: 4.13 Mil/uL (ref 3.87–5.11)
RDW: 14.3 % (ref 11.5–14.6)
WBC: 4 10*3/uL — AB (ref 4.5–10.5)

## 2013-04-09 LAB — LIPID PANEL
CHOL/HDL RATIO: 3
CHOLESTEROL: 236 mg/dL — AB (ref 0–200)
HDL: 78.8 mg/dL (ref >39.00)
LDL CALC: 147 mg/dL — AB (ref 0–99)
Triglycerides: 49 mg/dL (ref 0.0–149.0)
VLDL: 9.8 mg/dL (ref 0.0–40.0)

## 2013-04-09 LAB — BASIC METABOLIC PANEL
BUN: 21 mg/dL (ref 6–23)
CHLORIDE: 102 meq/L (ref 96–112)
CO2: 30 mEq/L (ref 19–32)
Calcium: 9.2 mg/dL (ref 8.4–10.5)
Creatinine, Ser: 0.7 mg/dL (ref 0.4–1.2)
GFR: 108.78 mL/min (ref >60.00)
Glucose, Bld: 81 mg/dL (ref 70–99)
POTASSIUM: 3.9 meq/L (ref 3.5–5.1)
Sodium: 137 mEq/L (ref 135–145)

## 2013-04-09 LAB — TSH: TSH: 0.33 u[IU]/mL — AB (ref 0.35–5.50)

## 2013-04-09 MED ORDER — CYCLOBENZAPRINE HCL 10 MG PO TABS: 10.0000 mg | ORAL_TABLET | Freq: Three times a day (TID) | ORAL | Status: DC | PRN

## 2013-04-09 MED ORDER — MELOXICAM 15 MG PO TABS: 15.0000 mg | ORAL_TABLET | Freq: Every day | ORAL | Status: DC

## 2013-04-09 NOTE — Progress Notes (Signed)
Pre visit review using our clinic review tool, if applicable. No additional management support is needed unless otherwise documented below in the visit note. 

## 2013-04-09 NOTE — Assessment & Plan Note (Signed)
New.  Pain worse w/ extension.  No red flags on hx or PE.  Suspect muscular pain due to sleep/poor mattress.  Start daily NSAIDs, flexeril prn.  If no improvement will refer to Sports Med.

## 2013-04-09 NOTE — Assessment & Plan Note (Signed)
Pt's PE WNL w/ exception of obesity and degenerative changes in R knee.  UTD on pap, due for mammo.  Too young for colonoscopy.  Tdap updated.  Check labs.  Anticipatory guidance provided.

## 2013-04-09 NOTE — Addendum Note (Signed)
Addended by: Kris Hartmann on: 04/09/2013 09:09 AM   Modules accepted: Orders

## 2013-04-09 NOTE — Patient Instructions (Signed)
Follow up in 1 year or as needed Start the Mobic daily for the back and knee pain Use the flexeril nightly for back spasm and sleep (will cause drowsiness) If no improvement in pain in the next 2 weeks, please call me and we'll send you to ortho We'll notify you of your lab results and make any changes if needed Try and make healthy food choices and get regular exercise We'll call you with your mammo appt Call with any questions or concerns Happy Spring!!

## 2013-04-09 NOTE — Progress Notes (Signed)
   Subjective:    Patient ID: Mary [name], female    DOB: 1965/06/29, 48 y.o.   MRN: [id number]  HPI CPE- UTD on pap, due for mammo  Back pain- pain is intermittent but can be 'so severe' that she is 'unable to roll over at night'.  Pain is center of low back.  Pain radiates into R buttock.  Pain improves w/ activity, worse at night.  Pain improves w/ naproxen.  No weakness/numbness  R knee pain- sxs started 2 months ago.  Increased stiffness at rest, improves w/ activity.  No injury.  Pain w/ stairs.  Pain w/ extremes of flexion/extension.  Improves w/ naproxen.  No swelling.   Review of Systems Patient reports no vision/ hearing changes, adenopathy,fever, weight change,  persistant/recurrent hoarseness , swallowing issues, chest pain, palpitations, edema, persistant/recurrent cough, hemoptysis, dyspnea (rest/exertional/paroxysmal nocturnal), gastrointestinal bleeding (melena, rectal bleeding), abdominal pain, significant heartburn, bowel changes, GU symptoms (dysuria, hematuria, incontinence), Gyn symptoms (abnormal  bleeding, pain),  syncope, focal weakness, memory loss, numbness & tingling, skin/hair/nail changes, abnormal bruising or bleeding, anxiety, or depression.     Objective:   Physical Exam  General Appearance:    Alert, cooperative, no distress, appears stated age  Head:    Normocephalic, without obvious abnormality, atraumatic  Eyes:    PERRL, conjunctiva/corneas clear, EOM's intact, fundi    benign, both eyes  Ears:    Normal TM's and external ear canals, both ears  Nose:   Nares normal, septum midline, mucosa normal, no drainage    or sinus tenderness  Throat:   Lips, mucosa, and tongue normal; teeth and gums normal  Neck:   Supple, symmetrical, trachea midline, no adenopathy;    Thyroid: no enlargement/tenderness/nodules  Back:     Symmetric, no curvature, ROM normal, no CVA tenderness  Lungs:     Clear to auscultation bilaterally, respirations unlabored  Chest  Wall:    No tenderness or deformity   Heart:    Regular rate and rhythm, S1 and S2 normal, no murmur, rub   or gallop  Breast Exam:    No tenderness, masses, or nipple abnormality  Abdomen:     Soft, non-tender, bowel sounds active all four quadrants,    no masses, no organomegaly  Genitalia:    deferred  Rectal:    Extremities:   Extremities normal, atraumatic, no cyanosis or edema.  R knee TTP along medial joint line, apparent degenerative changes  Pulses:   2+ and symmetric all extremities  Skin:   Skin color, texture, turgor normal, no rashes or lesions  Lymph nodes:   Cervical, supraclavicular, and axillary nodes normal  Neurologic:   CNII-XII intact, normal strength, sensation and reflexes    throughout          Assessment & Plan:

## 2013-04-09 NOTE — Assessment & Plan Note (Signed)
New.  Suspect early degenerative changes.  Stressed importance of regular exercise and weight loss to avoid additional strain on knee.  Start daily NSAIDs.  Refer to sports med if no improvement- may require injxn.  Pt expressed understanding and is in agreement w/ plan.

## 2013-04-10 ENCOUNTER — Ambulatory Visit: Payer: Managed Care, Other (non HMO)

## 2013-04-10 ENCOUNTER — Encounter: Payer: Self-pay | Admitting: General Practice

## 2013-04-10 DIAGNOSIS — R946 Abnormal results of thyroid function studies: Secondary | ICD-10-CM

## 2013-04-10 LAB — T4, FREE: Free T4: 0.75 ng/dL (ref 0.60–1.60)

## 2013-04-10 LAB — T3, FREE: T3 FREE: 2.7 pg/mL (ref 2.3–4.2)

## 2013-04-10 NOTE — Telephone Encounter (Signed)
Unable to reach patient pre visit.  

## 2013-04-15 ENCOUNTER — Encounter: Payer: Self-pay | Admitting: General Practice

## 2013-04-15 LAB — VITAMIN D 1,25 DIHYDROXY
Vitamin D 1, 25 (OH)2 Total: 69 pg/mL (ref 18–72)
Vitamin D2 1, 25 (OH)2: <8 pg/mL
Vitamin D3 1, 25 (OH)2: 69 pg/mL

## 2013-04-25 ENCOUNTER — Ambulatory Visit
Admission: RE | Admit: 2013-04-25 | Discharge: 2013-04-25 | Disposition: A | Discharge status: Home / Self Care | Payer: Managed Care, Other (non HMO) | Source: Ambulatory Visit | Attending: Family Medicine | Admitting: Family Medicine

## 2013-04-25 DIAGNOSIS — Z1231 Encounter for screening mammogram for malignant neoplasm of breast: Secondary | ICD-10-CM

## 2013-04-26 ENCOUNTER — Encounter: Payer: Self-pay | Admitting: Nurse Practitioner

## 2013-04-26 ENCOUNTER — Ambulatory Visit (INDEPENDENT_AMBULATORY_CARE_PROVIDER_SITE_OTHER): Payer: Managed Care, Other (non HMO) | Admitting: Nurse Practitioner

## 2013-04-26 VITALS — BP 129/82 | HR 65 | Temp 97.8°F | Ht 66.5 in | Wt 191.0 lb

## 2013-04-26 DIAGNOSIS — G43019 Migraine without aura, intractable, without status migrainosus: Secondary | ICD-10-CM

## 2013-04-26 MED ORDER — PROPRANOLOL HCL 40 MG PO TABS: 40.0000 mg | ORAL_TABLET | Freq: Two times a day (BID) | ORAL | Status: DC

## 2013-04-26 NOTE — Patient Instructions (Signed)
Continue propanolol 40 mg twice daily as headache preventative Followup yearly and when necessary

## 2013-04-26 NOTE — Progress Notes (Signed)
[facility]  PATIENT: Mary [name] DOB: 1966/01/16   REASON FOR VISIT: Followup for migraine   HISTORY OF PRESENT ILLNESS: Mary [name], 48 year old female returns for followup. She was last seen in this office 02/15/2012. She has a history of migraine headaches. Her headaches are currently well controlled on propanolol 40 mg twice daily. Her headaches are rare at this point. She continues to exercise,  her review of systems is negative. She returns for reevaluation    HISTORY:of migraine headaches. She was on Topamax and  felt the headaches were worse. She is currently on propranolol twice a day and doing well. Her last migraine was over a month ago  Her trigger some times is not eating correctly and due to hypoglycemia. She was taking  Midrin  but that drug has been taken off the market.  Ultram has been tried and she  thought made her headaches were worse.  She is now taking Cambia with good effect. She continues to exercise, she has occasional joint pain, low back pain and constipation. She has lost a few pounds since last seen. No new neurological complaints.   REVIEW OF SYSTEMS: Full 14 system review of systems performed and notable only for those listed, all others are neg:  Constitutional: N/A  Cardiovascular: N/A  Ear/Nose/Throat: N/A  Skin: N/A  Eyes: N/A  Respiratory: N/A  Gastroitestinal: N/A  Hematology/Lymphatic: N/A  Endocrine: N/A Musculoskeletal:N/A  Allergy/Immunology: N/A  Neurological: N/A Psychiatric: N/A   ALLERGIES: Allergies  Allergen Reactions  . Seasonal Ic [Cholestatin]     HOME MEDICATIONS: Outpatient Prescriptions Prior to Visit  Medication Sig Dispense Refill  . Calcium Carbonate-Vitamin D (CALCIUM 600+D3) 600-400 MG-UNIT per tablet Take 1 tablet by mouth daily.      . cyclobenzaprine (FLEXERIL) 10 MG tablet Take 1 tablet (10 mg total) by mouth 3 (three) times daily as needed for muscle spasms.  30 tablet  1  .  meloxicam (MOBIC) 15 MG tablet Take 1 tablet (15 mg total) by mouth daily.  30 tablet  1  . Multiple Vitamin (MULTIVITAMIN) tablet Take 1 tablet by mouth daily.      . propranolol (INDERAL) 40 MG tablet Take 40 mg by mouth 2 (two) times daily.       No facility-administered medications prior to visit.    PAST MEDICAL HISTORY: Past Medical History  Diagnosis Date  . Headache(784.0)     PAST SURGICAL HISTORY: History reviewed. No pertinent past surgical history.  FAMILY HISTORY: History reviewed. No pertinent family history.  SOCIAL HISTORY: History   Social History  . Marital Status: Married    Spouse Name: [name]    Number of Children: 2  . Years of Education: 12   Occupational History  .  [employer]   Social History Main Topics  . Smoking status: Never Smoker   . Smokeless tobacco: Never Used  . Alcohol Use: No  . Drug Use: No  . Sexual Activity: Yes   Other Topics Concern  . Not on file   Social History Narrative   Patient is right handed, resides with husband([name]),two children in a home.     PHYSICAL EXAM  Filed Vitals:   04/26/13 0923  BP: 129/82  Pulse: 65  Temp: 97.8 F (36.6 C)  TempSrc: Oral  Height: 5' 6.5" (1.689 m)  Weight: 191 lb (86.637 kg)   Body mass index is 30.37 kg/(m^2).  Generalized: Well developed, in no acute distress  Neurological examination   Mentation: Alert  oriented to time, place, history taking. Follows all commands speech and language fluent  Cranial nerve II-XII: Pupils were equal round reactive to light extraocular movements were full, visual field were full on confrontational test. Facial sensation and strength were normal. hearing was intact to finger rubbing bilaterally. Uvula tongue midline. head turning and shoulder shrug were normal and symmetric.Tongue protrusion into cheek strength was normal. Motor: normal bulk and tone, full strength in the BUE, BLE,  No focal weakness Coordination: finger-nose-finger,  heel-to-shin bilaterally, no dysmetria Reflexes: Brachioradialis 2/2, biceps 2/2, triceps 2/2, patellar 2/2, Achilles 2/2, plantar responses were flexor bilaterally. Gait and Station: Rising up from seated position without assistance, normal stance,  moderate stride, good arm swing, smooth turning, able to perform tiptoe, and heel walking without difficulty. Tandem gait is steady  DIAGNOSTIC DATA (LABS, IMAGING, TESTING) - I reviewed patient records, labs, notes, testing and imaging myself where available.  Lab Results  Component Value Date   WBC 4.0* 04/09/2013   HGB 12.8 04/09/2013   HCT 38.8 04/09/2013   MCV 94.1 04/09/2013   PLT 162.0 04/09/2013      Component Value Date/Time   NA 137 04/09/2013 1006   K 3.9 04/09/2013 1006   CL 102 04/09/2013 1006   CO2 30 04/09/2013 1006   GLUCOSE 81 04/09/2013 1006   GLUCOSE 90 12/27/2005 1118   BUN 21 04/09/2013 1006   CREATININE 0.7 04/09/2013 1006   CALCIUM 9.2 04/09/2013 1006   PROT 7.9 04/09/2013 1006   ALBUMIN 3.8 04/09/2013 1006   AST 23 04/09/2013 1006   ALT 22 04/09/2013 1006   ALKPHOS 75 04/09/2013 1006   BILITOT 1.0 04/09/2013 1006   GFRNONAA 122.03 07/08/2009 0836   Lab Results  Component Value Date   CHOL 236* 04/09/2013   HDL 78.80 04/09/2013   LDLCALC 147* 04/09/2013   LDLDIRECT 127.9 12/05/2011   TRIG 49.0 04/09/2013   CHOLHDL 3 04/09/2013    Lab Results  Component Value Date   TSH 0.33* 04/09/2013      ASSESSMENT AND PLAN  48 y.o. year old female  has a past medical history of Headache(784.0). here to followup. Her migraines are in good control.  Continue propanolol 40 mg twice daily as headache preventative Followup yearly and when necessary [name], [facility], [facility], APRN  [facility] [address] [place], [place] [address] [phone]

## 2013-04-26 NOTE — Progress Notes (Signed)
I have read the note, and I agree with the clinical assessment and plan.  Kloie Whiting K Durrell Barajas   

## 2013-05-04 ENCOUNTER — Other Ambulatory Visit: Payer: Self-pay | Admitting: Nurse Practitioner

## 2013-06-09 ENCOUNTER — Other Ambulatory Visit: Payer: Self-pay | Admitting: Family Medicine

## 2013-06-12 NOTE — Telephone Encounter (Signed)
Med filled.  

## 2013-06-24 ENCOUNTER — Other Ambulatory Visit: Payer: Self-pay | Admitting: Family Medicine

## 2013-06-24 NOTE — Telephone Encounter (Signed)
Med filled.  

## 2013-07-30 ENCOUNTER — Other Ambulatory Visit: Payer: Self-pay | Admitting: Family Medicine

## 2013-07-30 NOTE — Telephone Encounter (Signed)
Med filled.  

## 2013-08-02 ENCOUNTER — Other Ambulatory Visit: Payer: Self-pay | Admitting: Family Medicine

## 2013-08-02 NOTE — Telephone Encounter (Signed)
Med filled.  

## 2013-10-01 ENCOUNTER — Telehealth: Payer: Self-pay | Admitting: Family Medicine

## 2013-10-01 MED ORDER — MELOXICAM 15 MG PO TABS: ORAL_TABLET | ORAL | Status: DC

## 2013-10-01 NOTE — Telephone Encounter (Signed)
Pt.notified

## 2013-10-01 NOTE — Telephone Encounter (Signed)
Ok to use both meds as needed for pain and spasm.  The Mobic is once daily for inflammation and should be taken w/ food to avoid GI irritation.  The flexeril will cause drowsiness and is best used at night.  No harm in taking w/ propranolol

## 2013-10-01 NOTE — Telephone Encounter (Signed)
Caller name: Rukiya Relation to pt: self Call back number: [phone] Pharmacy:  Reason for call:   Patient wants to know what the side effects of meloxicam and flexeril are? She states that she is not having any problems but wants to know if she should continue taking these medication? She states that she hasn't taken these meds for past two days.

## 2013-10-01 NOTE — Telephone Encounter (Signed)
Please advise, pt has been using flexeril and meloxicam as needed for knee and back pain. Pt states that these pains are worse at night because she is on her feet all day. Pt would like to know if ok for her to remain on meloxicam and flexeril as needed with the propanolol for HA

## 2013-10-04 ENCOUNTER — Other Ambulatory Visit: Payer: Self-pay | Admitting: Family Medicine

## 2013-10-04 NOTE — Telephone Encounter (Signed)
Med filled.  

## 2013-12-10 ENCOUNTER — Encounter: Payer: Self-pay | Admitting: Neurology

## 2013-12-31 ENCOUNTER — Telehealth: Payer: Self-pay | Admitting: General Practice

## 2013-12-31 MED ORDER — CYCLOBENZAPRINE HCL 10 MG PO TABS: ORAL_TABLET | ORAL | Status: DC

## 2013-12-31 NOTE — Telephone Encounter (Signed)
Ok for #30 but this is not for regular use- just as needed for severe muscle spasm

## 2013-12-31 NOTE — Telephone Encounter (Signed)
Last OV 04-09-13 Flexeril last filled 10-05-13 #30 with 1  Please advise if you would like for pt to still be on this med.

## 2013-12-31 NOTE — Telephone Encounter (Signed)
Med filled.  

## 2014-01-08 ENCOUNTER — Other Ambulatory Visit: Payer: Self-pay | Admitting: General Practice

## 2014-01-08 MED ORDER — MELOXICAM 15 MG PO TABS: ORAL_TABLET | ORAL | Status: DC

## 2014-03-05 ENCOUNTER — Telehealth: Payer: Self-pay

## 2014-03-05 NOTE — Telephone Encounter (Signed)
Called patient and left message to call the office back and reschedule her apt. With Hoyle Sauer 04-28-14 at 2:30 CM/[place]. When patient calls back please put her on [place] schedule. Thanks Hinton Dyer.

## 2014-03-27 ENCOUNTER — Other Ambulatory Visit: Payer: Self-pay | Admitting: General Practice

## 2014-03-27 MED ORDER — CYCLOBENZAPRINE HCL 10 MG PO TABS: ORAL_TABLET | ORAL | Status: DC

## 2014-04-18 ENCOUNTER — Encounter: Payer: Self-pay | Admitting: Nurse Practitioner

## 2014-04-18 ENCOUNTER — Ambulatory Visit (INDEPENDENT_AMBULATORY_CARE_PROVIDER_SITE_OTHER): Payer: Managed Care, Other (non HMO) | Admitting: Nurse Practitioner

## 2014-04-18 VITALS — BP 124/82 | HR 68 | Ht 66.5 in | Wt 188.0 lb

## 2014-04-18 DIAGNOSIS — G43019 Migraine without aura, intractable, without status migrainosus: Secondary | ICD-10-CM | POA: Diagnosis not present

## 2014-04-18 MED ORDER — PROPRANOLOL HCL 40 MG PO TABS: 40.0000 mg | ORAL_TABLET | Freq: Two times a day (BID) | ORAL | Status: DC

## 2014-04-18 NOTE — Progress Notes (Signed)
[facility]  PATIENT: Mary [name] DOB: 1965/06/16   REASON FOR VISIT: Follow-up for migraine  HISTORY FROM: Patient    HISTORY OF PRESENT ILLNESS:Mary [name], 49 year old female returns for followup. She was last seen in this office 4/10/ 2015. She has a history of migraine headaches. Her headaches are currently well controlled on propanolol 40 mg twice daily. Her headaches are rare at this point. She is no longer exercising due to joint pain. She returns for reevaluation. She needs refills on her medications.    HISTORY:of migraine headaches. She was on Topamax and felt the headaches were worse. She is currently on propranolol twice a day and doing well. Her last migraine was over a month ago Her trigger some times is not eating correctly and due to hypoglycemia. She was taking Midrin but that drug has been taken off the market. Ultram has been tried and she thought made her headaches were worse. She is now taking Cambia with good effect. She continues to exercise, she has occasional joint pain, low back pain and constipation. She has lost a few pounds since last seen. No new neurological complaints.     REVIEW OF SYSTEMS: Full 14 system review of systems performed and notable only for those listed, all others are neg:  Constitutional: neg  Cardiovascular: neg Ear/Nose/Throat: neg  Skin: neg Eyes: neg Respiratory: neg Gastroitestinal: neg  Hematology/Lymphatic: neg  Endocrine: neg Musculoskeletal: Joint pain Allergy/Immunology: neg Neurological: neg Psychiatric: neg Sleep : neg   ALLERGIES: Allergies  Allergen Reactions  . Seasonal Ic [Cholestatin]     HOME MEDICATIONS: Outpatient Prescriptions Prior to Visit  Medication Sig Dispense Refill  . Calcium Carbonate-Vitamin D (CALCIUM 600+D3) 600-400 MG-UNIT per tablet Take 1 tablet by mouth daily.    . cyclobenzaprine (FLEXERIL) 10 MG tablet TAKE 1 TABLET BY MOUTH 3 TIMES DAILY AS NEEDED  FOR Sever Pain only 30 tablet 1  . meloxicam (MOBIC) 15 MG tablet TAKE 1 TABLET (15 MG TOTAL) BY MOUTH DAILY. 30 tablet 1  . Multiple Vitamin (MULTIVITAMIN) tablet Take 1 tablet by mouth daily.    . propranolol (INDERAL) 40 MG tablet TAKE 1 TABLET BY MOUTH TWICE A DAY 180 tablet 3   No facility-administered medications prior to visit.    PAST MEDICAL HISTORY: Past Medical History  Diagnosis Date  . Headache(784.0)     PAST SURGICAL HISTORY: History reviewed. No pertinent past surgical history.  FAMILY HISTORY: History reviewed. No pertinent family history.  SOCIAL HISTORY: History   Social History  . Marital Status: Married    Spouse Name: [name]  . Number of Children: 2  . Years of Education: 12   Occupational History  .  [employer]   Social History Main Topics  . Smoking status: Never Smoker   . Smokeless tobacco: Never Used  . Alcohol Use: No  . Drug Use: No  . Sexual Activity: Yes   Other Topics Concern  . Not on file   Social History Narrative   Patient is right handed, resides with husband([name]),two children in a home.     PHYSICAL EXAM  Filed Vitals:   04/18/14 0801  BP: 124/82  Pulse: 68  Height: 5' 6.5" (1.689 m)  Weight: 188 lb (85.276 kg)   Body mass index is 29.89 kg/(m^2).  Generalized: Well developed, in no acute distress  Head: normocephalic and atraumatic,. Oropharynx benign  Neck: Supple Neurological examination   Mentation: Alert oriented to time, place, history taking. Attention span and concentration appropriate.  Recent and remote memory intact.  Follows all commands speech and language fluent.   Cranial nerve II-XII: Pupils were equal round reactive to light extraocular movements were full, visual field were full on confrontational test. Facial sensation and strength were normal. hearing was intact to finger rubbing bilaterally. Uvula tongue midline. head turning and shoulder shrug were normal and symmetric.Tongue protrusion into  cheek strength was normal. Motor: normal bulk and tone, full strength in the BUE, BLE, fine finger movements normal, no pronator drift. No focal weakness Coordination: finger-nose-finger, heel-to-shin bilaterally, no dysmetria Reflexes: Brachioradialis 2/2, biceps 2/2, triceps 2/2, patellar 2/2, Achilles 2/2, plantar responses were flexor bilaterally. Gait and Station: Rising up from seated position without assistance, normal stance,  moderate stride, good arm swing, smooth turning, able to perform tiptoe, and heel walking without difficulty. Tandem gait is steady  DIAGNOSTIC DATA (LABS, IMAGING, TESTING) - ASSESSMENT AND PLAN  49 y.o. year old female  has a past medical history of Headache(784.0). /migraines here to follow-up. Her migraines are currently well controlled   Continue Propranol for migraine, will refill for 3 months F/U yearly and prn Call for increase in headaches [name], [facility], [facility], [facility] [address] [place], [place] [address] [phone]

## 2014-04-18 NOTE — Progress Notes (Signed)
I have read the note, and I agree with the clinical assessment and plan.  Elijio Staples KEITH   

## 2014-04-18 NOTE — Patient Instructions (Signed)
Continue Propranol for migraine, will refill for 3 months F/U yearly and prn Call for increase in headaches

## 2014-04-22 ENCOUNTER — Other Ambulatory Visit: Payer: Self-pay | Admitting: General Practice

## 2014-04-22 ENCOUNTER — Telehealth: Payer: Self-pay | Admitting: Family Medicine

## 2014-04-22 MED ORDER — MELOXICAM 15 MG PO TABS: ORAL_TABLET | ORAL | Status: DC

## 2014-04-22 NOTE — Telephone Encounter (Signed)
Pre visit letter sent  °

## 2014-04-28 ENCOUNTER — Ambulatory Visit: Payer: Managed Care, Other (non HMO) | Admitting: Nurse Practitioner

## 2014-05-13 ENCOUNTER — Telehealth: Payer: Self-pay

## 2014-05-13 NOTE — Telephone Encounter (Signed)
See speciality notes 

## 2014-05-14 ENCOUNTER — Other Ambulatory Visit: Payer: Self-pay | Admitting: Family Medicine

## 2014-05-14 ENCOUNTER — Ambulatory Visit (INDEPENDENT_AMBULATORY_CARE_PROVIDER_SITE_OTHER): Payer: Managed Care, Other (non HMO) | Admitting: Family Medicine

## 2014-05-14 ENCOUNTER — Other Ambulatory Visit (HOSPITAL_COMMUNITY)
Admission: RE | Admit: 2014-05-14 | Discharge: 2014-05-14 | Disposition: A | Discharge status: Home / Self Care | Payer: Managed Care, Other (non HMO) | Source: Ambulatory Visit | Attending: Family Medicine | Admitting: Family Medicine

## 2014-05-14 ENCOUNTER — Encounter: Payer: Self-pay | Admitting: Family Medicine

## 2014-05-14 VITALS — BP 122/82 | HR 70 | Temp 98.1°F | Resp 16 | Ht 66.0 in | Wt 185.4 lb

## 2014-05-14 DIAGNOSIS — R7989 Other specified abnormal findings of blood chemistry: Secondary | ICD-10-CM

## 2014-05-14 DIAGNOSIS — Z1231 Encounter for screening mammogram for malignant neoplasm of breast: Secondary | ICD-10-CM | POA: Diagnosis not present

## 2014-05-14 DIAGNOSIS — Z01419 Encounter for gynecological examination (general) (routine) without abnormal findings: Secondary | ICD-10-CM

## 2014-05-14 DIAGNOSIS — Z1151 Encounter for screening for human papillomavirus (HPV): Secondary | ICD-10-CM | POA: Insufficient documentation

## 2014-05-14 DIAGNOSIS — Z124 Encounter for screening for malignant neoplasm of cervix: Secondary | ICD-10-CM | POA: Diagnosis not present

## 2014-05-14 DIAGNOSIS — E785 Hyperlipidemia, unspecified: Secondary | ICD-10-CM

## 2014-05-14 LAB — CBC WITH DIFFERENTIAL/PLATELET
Basophils Absolute: 0 10*3/uL (ref 0.0–0.1)
Basophils Relative: 0.4 % (ref 0.0–3.0)
EOS PCT: 4.7 % (ref 0.0–5.0)
Eosinophils Absolute: 0.2 10*3/uL (ref 0.0–0.7)
HCT: 36 % (ref 36.0–46.0)
HEMOGLOBIN: 12.2 g/dL (ref 12.0–15.0)
Lymphocytes Relative: 44.9 % (ref 12.0–46.0)
Lymphs Abs: 1.6 10*3/uL (ref 0.7–4.0)
MCHC: 33.8 g/dL (ref 30.0–36.0)
MCV: 90.8 fl (ref 78.0–100.0)
Monocytes Absolute: 0.3 10*3/uL (ref 0.1–1.0)
Monocytes Relative: 7.8 % (ref 3.0–12.0)
NEUTROS PCT: 42.2 % — AB (ref 43.0–77.0)
Neutro Abs: 1.5 10*3/uL (ref 1.4–7.7)
PLATELETS: 171 10*3/uL (ref 150.0–400.0)
RBC: 3.96 Mil/uL (ref 3.87–5.11)
RDW: 14.7 % (ref 11.5–15.5)
WBC: 3.6 10*3/uL — ABNORMAL LOW (ref 4.0–10.5)

## 2014-05-14 LAB — LIPID PANEL
Cholesterol: 232 mg/dL — ABNORMAL HIGH (ref 0–200)
HDL: 73.4 mg/dL (ref >39.00)
LDL Cholesterol: 152 mg/dL — ABNORMAL HIGH (ref 0–99)
NonHDL: 158.6
Total CHOL/HDL Ratio: 3
Triglycerides: 34 mg/dL (ref 0.0–149.0)
VLDL: 6.8 mg/dL (ref 0.0–40.0)

## 2014-05-14 LAB — BASIC METABOLIC PANEL
BUN: 12 mg/dL (ref 6–23)
CALCIUM: 9.4 mg/dL (ref 8.4–10.5)
CO2: 32 mEq/L (ref 19–32)
Chloride: 102 mEq/L (ref 96–112)
Creatinine, Ser: 1.28 mg/dL — ABNORMAL HIGH (ref 0.40–1.20)
GFR: 57.52 mL/min — ABNORMAL LOW (ref >60.00)
Glucose, Bld: 80 mg/dL (ref 70–99)
Potassium: 4.1 mEq/L (ref 3.5–5.1)
SODIUM: 136 meq/L (ref 135–145)

## 2014-05-14 LAB — HEPATIC FUNCTION PANEL
ALK PHOS: 76 U/L (ref 39–117)
ALT: 20 U/L (ref 0–35)
AST: 20 U/L (ref 0–37)
Albumin: 3.8 g/dL (ref 3.5–5.2)
BILIRUBIN DIRECT: 0.1 mg/dL (ref 0.0–0.3)
Total Bilirubin: 0.6 mg/dL (ref 0.2–1.2)
Total Protein: 7.1 g/dL (ref 6.0–8.3)

## 2014-05-14 LAB — TSH: TSH: 0.58 u[IU]/mL (ref 0.35–4.50)

## 2014-05-14 LAB — VITAMIN D 25 HYDROXY (VIT D DEFICIENCY, FRACTURES): VITD: 50.85 ng/mL (ref 30.00–100.00)

## 2014-05-14 MED ORDER — ATORVASTATIN CALCIUM 10 MG PO TABS: 10.0000 mg | ORAL_TABLET | Freq: Every day | ORAL | Status: DC

## 2014-05-14 MED ORDER — CYCLOBENZAPRINE HCL 10 MG PO TABS: ORAL_TABLET | ORAL | Status: DC

## 2014-05-14 MED ORDER — MELOXICAM 15 MG PO TABS: ORAL_TABLET | ORAL | Status: DC

## 2014-05-14 NOTE — Patient Instructions (Signed)
Follow up in 1 year or as needed We'll notify you of your lab results and make any changes if needed Keep up the good work on healthy diet and regular exercise We'll call you with your mammo appt Call with any questions or concerns Happy Spring!!!

## 2014-05-14 NOTE — Assessment & Plan Note (Signed)
Pt's PE WNL.  Pap collected.  Mammogram ordered.  Check labs.  Anticipatory guidance provided.

## 2014-05-14 NOTE — Progress Notes (Signed)
   Subjective:    Patient ID: Mary [name], female    DOB: February 26, 1965, 49 y.o.   MRN: [id number]  HPI CPE- pt is due for mammo and pap.  Too young for colonoscopy.   Review of Systems Patient reports no vision/ hearing changes, adenopathy,fever, weight change,  persistant/recurrent hoarseness , swallowing issues, chest pain, palpitations, edema, persistant/recurrent cough, hemoptysis, dyspnea (rest/exertional/paroxysmal nocturnal), gastrointestinal bleeding (melena, rectal bleeding), abdominal pain, significant heartburn, bowel changes, GU symptoms (dysuria, hematuria, incontinence), Gyn symptoms (abnormal  bleeding, pain),  syncope, focal weakness, memory loss, numbness & tingling, skin/hair/nail changes, abnormal bruising or bleeding, anxiety, or depression.     Objective:   Physical Exam  General Appearance:    Alert, cooperative, no distress, appears stated age  Head:    Normocephalic, without obvious abnormality, atraumatic  Eyes:    PERRL, conjunctiva/corneas clear, EOM's intact, fundi    benign, both eyes  Ears:    Normal TM's and external ear canals, both ears  Nose:   Nares normal, septum midline, mucosa normal, no drainage    or sinus tenderness  Throat:   Lips, mucosa, and tongue normal; teeth and gums normal  Neck:   Supple, symmetrical, trachea midline, no adenopathy;    Thyroid: no enlargement/tenderness/nodules  Back:     Symmetric, no curvature, ROM normal, no CVA tenderness  Lungs:     Clear to auscultation bilaterally, respirations unlabored  Chest Wall:    No tenderness or deformity   Heart:    Regular rate and rhythm, S1 and S2 normal, no murmur, rub   or gallop  Breast Exam:    No tenderness, masses, or nipple abnormality  Abdomen:     Soft, non-tender, bowel sounds active all four quadrants,    no masses, no organomegaly  Genitalia:    External genitalia normal, cervix normal in appearance, no CMT, uterus in normal size and position, adnexa w/out mass or  tenderness, mucosa pink and moist, no lesions or discharge present  Rectal:    Normal external appearance  Extremities:   Extremities normal, atraumatic, no cyanosis or edema  Pulses:   2+ and symmetric all extremities  Skin:   Skin color, texture, turgor normal, no rashes or lesions  Lymph nodes:   Cervical, supraclavicular, and axillary nodes normal  Neurologic:   CNII-XII intact, normal strength, sensation and reflexes    throughout          Assessment & Plan:

## 2014-05-14 NOTE — Assessment & Plan Note (Signed)
Pap collected. 

## 2014-05-14 NOTE — Progress Notes (Signed)
Pre visit review using our clinic review tool, if applicable. No additional management support is needed unless otherwise documented below in the visit note. 

## 2014-05-15 LAB — CYTOLOGY - PAP

## 2014-06-03 ENCOUNTER — Telehealth: Payer: Self-pay | Admitting: Family Medicine

## 2014-06-03 NOTE — Telephone Encounter (Signed)
error 

## 2014-06-30 ENCOUNTER — Other Ambulatory Visit (INDEPENDENT_AMBULATORY_CARE_PROVIDER_SITE_OTHER): Payer: Managed Care, Other (non HMO)

## 2014-06-30 DIAGNOSIS — R748 Abnormal levels of other serum enzymes: Secondary | ICD-10-CM

## 2014-06-30 DIAGNOSIS — E785 Hyperlipidemia, unspecified: Secondary | ICD-10-CM | POA: Diagnosis not present

## 2014-06-30 DIAGNOSIS — R7989 Other specified abnormal findings of blood chemistry: Secondary | ICD-10-CM

## 2014-06-30 LAB — HEPATIC FUNCTION PANEL
ALBUMIN: 3.8 g/dL (ref 3.5–5.2)
ALK PHOS: 72 U/L (ref 39–117)
ALT: 15 U/L (ref 0–35)
AST: 17 U/L (ref 0–37)
Bilirubin, Direct: 0.2 mg/dL (ref 0.0–0.3)
TOTAL PROTEIN: 7.5 g/dL (ref 6.0–8.3)
Total Bilirubin: 0.7 mg/dL (ref 0.2–1.2)

## 2014-06-30 LAB — BASIC METABOLIC PANEL
BUN: 11 mg/dL (ref 6–23)
CALCIUM: 9.3 mg/dL (ref 8.4–10.5)
CO2: 29 mEq/L (ref 19–32)
Chloride: 102 mEq/L (ref 96–112)
Creatinine, Ser: 0.72 mg/dL (ref 0.40–1.20)
GFR: 111.68 mL/min (ref >60.00)
GLUCOSE: 92 mg/dL (ref 70–99)
Potassium: 4.2 mEq/L (ref 3.5–5.1)
SODIUM: 135 meq/L (ref 135–145)

## 2014-09-12 ENCOUNTER — Telehealth: Payer: Self-pay | Admitting: Family Medicine

## 2014-09-12 ENCOUNTER — Other Ambulatory Visit: Payer: Self-pay | Admitting: Family Medicine

## 2014-09-12 MED ORDER — ATORVASTATIN CALCIUM 10 MG PO TABS: 10.0000 mg | ORAL_TABLET | Freq: Every day | ORAL | Status: DC

## 2014-09-12 NOTE — Telephone Encounter (Signed)
Medication filled to pharmacy as requested.   

## 2014-09-12 NOTE — Telephone Encounter (Signed)
Pt called in to request a refill on 2 medications. She says that she tried getting them filled at her pharmacy and was told that she needed to call her Dr. Abbott Pao isn't sure of the name of the medications or what they are for.   Pt's call back number is: [phone]

## 2014-09-12 NOTE — Telephone Encounter (Signed)
Spoke with pt and filled her atorvastatin and her flexeril for #90 as the pharmacy requested.

## 2014-09-28 ENCOUNTER — Other Ambulatory Visit: Payer: Self-pay | Admitting: Family Medicine

## 2014-09-29 NOTE — Telephone Encounter (Signed)
Medication filled to pharmacy as requested.   

## 2014-11-17 ENCOUNTER — Encounter: Payer: Self-pay | Admitting: Family Medicine

## 2014-11-17 ENCOUNTER — Other Ambulatory Visit: Payer: Managed Care, Other (non HMO)

## 2014-11-17 ENCOUNTER — Ambulatory Visit (INDEPENDENT_AMBULATORY_CARE_PROVIDER_SITE_OTHER): Payer: Managed Care, Other (non HMO) | Admitting: Family Medicine

## 2014-11-17 VITALS — BP 120/80 | HR 79 | Temp 98.0°F | Resp 16 | Ht 66.0 in | Wt 189.2 lb

## 2014-11-17 DIAGNOSIS — Z23 Encounter for immunization: Secondary | ICD-10-CM

## 2014-11-17 DIAGNOSIS — E785 Hyperlipidemia, unspecified: Secondary | ICD-10-CM

## 2014-11-17 DIAGNOSIS — E010 Iodine-deficiency related diffuse (endemic) goiter: Secondary | ICD-10-CM | POA: Insufficient documentation

## 2014-11-17 DIAGNOSIS — E049 Nontoxic goiter, unspecified: Secondary | ICD-10-CM | POA: Diagnosis not present

## 2014-11-17 DIAGNOSIS — E669 Obesity, unspecified: Secondary | ICD-10-CM | POA: Diagnosis not present

## 2014-11-17 DIAGNOSIS — E7800 Pure hypercholesterolemia, unspecified: Secondary | ICD-10-CM | POA: Insufficient documentation

## 2014-11-17 LAB — LIPID PANEL
CHOL/HDL RATIO: 2
Cholesterol: 180 mg/dL (ref 0–200)
HDL: 82.6 mg/dL (ref >39.00)
LDL Cholesterol: 88 mg/dL (ref 0–99)
NonHDL: 96.96
Triglycerides: 46 mg/dL (ref 0.0–149.0)
VLDL: 9.2 mg/dL (ref 0.0–40.0)

## 2014-11-17 LAB — HEPATIC FUNCTION PANEL
ALK PHOS: 80 U/L (ref 39–117)
ALT: 17 U/L (ref 0–35)
AST: 19 U/L (ref 0–37)
Albumin: 3.9 g/dL (ref 3.5–5.2)
BILIRUBIN DIRECT: 0.2 mg/dL (ref 0.0–0.3)
Total Bilirubin: 0.9 mg/dL (ref 0.2–1.2)
Total Protein: 8 g/dL (ref 6.0–8.3)

## 2014-11-17 LAB — BASIC METABOLIC PANEL
BUN: 10 mg/dL (ref 6–23)
CHLORIDE: 100 meq/L (ref 96–112)
CO2: 29 mEq/L (ref 19–32)
Calcium: 9.8 mg/dL (ref 8.4–10.5)
Creatinine, Ser: 0.74 mg/dL (ref 0.40–1.20)
GFR: 108.02 mL/min (ref >60.00)
Glucose, Bld: 96 mg/dL (ref 70–99)
POTASSIUM: 4 meq/L (ref 3.5–5.1)
Sodium: 136 mEq/L (ref 135–145)

## 2014-11-17 LAB — TSH: TSH: 0.84 u[IU]/mL (ref 0.35–4.50)

## 2014-11-17 NOTE — Patient Instructions (Signed)
Schedule your complete physical in 6 months We'll notify you of your lab results and make any changes if needed Continue to work on healthy diet and regular exercise- you can do it! We'll call you with your thyroid ultrasound appt Call with any questions or concerns If you want to join [place] at the new [place] office, any scheduled appointments will automatically transfer and we will see you at [address] [place] [address] Mary [name] [place], [place] [address]  Happy Halloween!

## 2014-11-17 NOTE — Assessment & Plan Note (Signed)
New.  Pt's thyroid feels generally enlarged, R>L.  No TTP.  Check TSH.  Will get [place] to assess.

## 2014-11-17 NOTE — Progress Notes (Signed)
   Subjective:    Patient ID: Mary [name], female    DOB: Feb 22, 1965, 49 y.o.   MRN: [id number]  HPI Hyperlipidemia- chronic problem for pt.  On Lipitor w/o difficulty.  Has gained 4 lbs since last visit.  No CP, SOB, HAs, visual changes, abd pain, N/V, myalgias.  Pt is not getting regular cardiovascular activity.  'i walk sometimes'.    Review of Systems For ROS see HPI     Objective:   Physical Exam  Constitutional: She is oriented to person, place, and time. She appears well-developed and well-nourished. No distress.  HENT:  Head: Normocephalic and atraumatic.  Eyes: Conjunctivae and EOM are normal. Pupils are equal, round, and reactive to light.  Neck: Normal range of motion. Neck supple. Thyromegaly (diffuse w/o nodularity) present.  Cardiovascular: Normal rate, regular rhythm, normal heart sounds and intact distal pulses.   No murmur heard. Pulmonary/Chest: Effort normal and breath sounds normal. No respiratory distress.  Abdominal: Soft. She exhibits no distension. There is no tenderness.  Musculoskeletal: She exhibits no edema.  Lymphadenopathy:    She has no cervical adenopathy.  Neurological: She is alert and oriented to person, place, and time.  Skin: Skin is warm and dry.  Psychiatric: She has a normal mood and affect. Her behavior is normal.  Vitals reviewed.         Assessment & Plan:

## 2014-11-17 NOTE — Assessment & Plan Note (Signed)
Pt continues to gain weight.  Pt is not getting regular cardiovascular exercise- stressed the need for healthy diet and regular exercise.  Check labs to risk stratify.  Will follow.

## 2014-11-17 NOTE — Assessment & Plan Note (Signed)
Chronic problem.  Tolerating statin w/o difficulty.  Check labs.  Adjust meds prn  

## 2014-11-17 NOTE — Progress Notes (Signed)
Pre visit review using our clinic review tool, if applicable. No additional management support is needed unless otherwise documented below in the visit note. 

## 2014-11-18 ENCOUNTER — Ambulatory Visit (HOSPITAL_BASED_OUTPATIENT_CLINIC_OR_DEPARTMENT_OTHER)
Admission: RE | Admit: 2014-11-18 | Discharge: 2014-11-18 | Disposition: A | Discharge status: Home / Self Care | Payer: Managed Care, Other (non HMO) | Source: Ambulatory Visit | Attending: Family Medicine | Admitting: Family Medicine

## 2014-11-18 ENCOUNTER — Other Ambulatory Visit: Payer: Self-pay | Admitting: Family Medicine

## 2014-11-18 DIAGNOSIS — E042 Nontoxic multinodular goiter: Secondary | ICD-10-CM | POA: Insufficient documentation

## 2014-11-18 DIAGNOSIS — E010 Iodine-deficiency related diffuse (endemic) goiter: Secondary | ICD-10-CM | POA: Diagnosis present

## 2014-11-18 DIAGNOSIS — Z1231 Encounter for screening mammogram for malignant neoplasm of breast: Secondary | ICD-10-CM

## 2014-12-01 ENCOUNTER — Other Ambulatory Visit: Payer: Self-pay | Admitting: Otolaryngology

## 2014-12-01 DIAGNOSIS — E041 Nontoxic single thyroid nodule: Secondary | ICD-10-CM

## 2014-12-02 ENCOUNTER — Other Ambulatory Visit: Payer: Self-pay | Admitting: Otolaryngology

## 2014-12-02 DIAGNOSIS — E041 Nontoxic single thyroid nodule: Secondary | ICD-10-CM

## 2014-12-04 ENCOUNTER — Ambulatory Visit
Admission: RE | Admit: 2014-12-04 | Discharge: 2014-12-04 | Disposition: A | Discharge status: Home / Self Care | Payer: Managed Care, Other (non HMO) | Source: Ambulatory Visit | Attending: Otolaryngology | Admitting: Otolaryngology

## 2014-12-04 ENCOUNTER — Other Ambulatory Visit (HOSPITAL_COMMUNITY)
Admission: RE | Admit: 2014-12-04 | Discharge: 2014-12-04 | Disposition: A | Discharge status: Home / Self Care | Payer: Managed Care, Other (non HMO) | Source: Ambulatory Visit | Attending: Radiology | Admitting: Radiology

## 2014-12-04 DIAGNOSIS — E041 Nontoxic single thyroid nodule: Secondary | ICD-10-CM

## 2014-12-05 ENCOUNTER — Ambulatory Visit (INDEPENDENT_AMBULATORY_CARE_PROVIDER_SITE_OTHER): Payer: Managed Care, Other (non HMO) | Admitting: Family

## 2014-12-05 ENCOUNTER — Encounter: Payer: Self-pay | Admitting: Family

## 2014-12-05 VITALS — BP 122/90 | HR 75 | Temp 98.3°F | Resp 18 | Ht 66.0 in | Wt 190.0 lb

## 2014-12-05 DIAGNOSIS — H1131 Conjunctival hemorrhage, right eye: Secondary | ICD-10-CM | POA: Diagnosis not present

## 2014-12-05 NOTE — Progress Notes (Signed)
Pre visit review using our clinic review tool, if applicable. No additional management support is needed unless otherwise documented below in the visit note. 

## 2014-12-05 NOTE — Assessment & Plan Note (Signed)
Symptoms and exam consistent with subconjunctival hemorrhage of undetermined origin. Eye exam  is otherwise benign. Continue to monitor and should resolve without treatment. If symptoms worsen or fail to improve, follow-up with ophthalmology.

## 2014-12-05 NOTE — Progress Notes (Signed)
Subjective:    Patient ID: Mary [name], female    DOB: December 20, 1965, 49 y.o.   MRN: [id number]  Chief Complaint  Patient presents with  . Eye redness    right eye is red, no irritation or pain, x3 days    HPI:  Mary [name] is a 49 y.o. female who  has a past medical history of Headache(784.0) and Hyperlipidemia. and presents today for an acute office visit.  Associated symptom of eye redness has been going on for about 3 days. Denies irritation, discharge or pain. Denies any trauma or changes in vision. Modifying factors include antihistamine eye drops which has not helped very much. Course has stayed about the same with no improvement.  Allergies  Allergen Reactions  . Seasonal Ic [Cholestatin]     Pt states no allergies to medications, unsure of reaction if any     Current Outpatient Prescriptions on File Prior to Visit  Medication Sig Dispense Refill  . atorvastatin (LIPITOR) 10 MG tablet Take 1 tablet (10 mg total) by mouth daily. 90 tablet 1  . Calcium Carbonate-Vitamin D (CALCIUM 600+D3) 600-400 MG-UNIT per tablet Take 1 tablet by mouth daily.    . cyclobenzaprine (FLEXERIL) 10 MG tablet TAKE 1 TABLET BY MOUTH 3 TIMES DAILY AS NEEDED FOR SEVER PAIN ONLY 90 tablet 0  . meloxicam (MOBIC) 15 MG tablet TAKE 1 TABLET BY MOUTH EVERY DAY 30 tablet 1  . Multiple Vitamin (MULTIVITAMIN) tablet Take 1 tablet by mouth daily.    . propranolol (INDERAL) 40 MG tablet Take 1 tablet (40 mg total) by mouth 2 (two) times daily. 180 tablet 3   No current facility-administered medications on file prior to visit.    Review of Systems  Constitutional: Negative for fever and chills.  Eyes: Positive for redness. Negative for photophobia, pain, discharge, itching and visual disturbance.  Respiratory: Negative for chest tightness.   Cardiovascular: Negative for chest pain, palpitations and leg swelling.      Objective:    BP 122/90 mmHg  Pulse 75  Temp(Src) 98.3 F (36.8 C)  (Oral)  Resp 18  Ht 5\' 6"  (1.676 m)  Wt 190 lb (86.183 kg)  BMI 30.68 kg/m2  SpO2 95% Nursing note and vital signs reviewed.  Physical Exam  Constitutional: She is oriented to person, place, and time. She appears well-developed and well-nourished. No distress.  Eyes: Conjunctivae, EOM and lids are normal. Pupils are equal, round, and reactive to light. Lids are everted and swept, no foreign bodies found.  Fundoscopic exam:      The right eye shows no arteriolar narrowing, no AV nicking, no exudate, no hemorrhage and no papilledema. The right eye shows red reflex.       The left eye shows no arteriolar narrowing, no AV nicking, no exudate, no hemorrhage and no papilledema. The left eye shows red reflex.    Cardiovascular: Normal rate, regular rhythm, normal heart sounds and intact distal pulses.   Pulmonary/Chest: Effort normal and breath sounds normal.  Neurological: She is alert and oriented to person, place, and time.  Skin: Skin is warm and dry.  Psychiatric: She has a normal mood and affect. Her behavior is normal. Judgment and thought content normal.       Assessment & Plan:   Problem List Items Addressed This Visit      Other   Subconjunctival hemorrhage of right eye - Primary    Symptoms and exam consistent with subconjunctival hemorrhage of undetermined origin. Eye  exam  is otherwise benign. Continue to monitor and should resolve without treatment. If symptoms worsen or fail to improve, follow-up with ophthalmology.

## 2014-12-05 NOTE — Patient Instructions (Signed)
Subconjunctival Hemorrhage °Subconjunctival hemorrhage is bleeding that happens between the white part of your eye (sclera) and the clear membrane that covers the outside of your eye (conjunctiva). There are many tiny blood vessels near the surface of your eye. A subconjunctival hemorrhage happens when one or more of these vessels breaks and bleeds, causing a red patch to appear on your eye. This is similar to a bruise. °Depending on the amount of bleeding, the red patch may only cover a small area of your eye or it may cover the entire visible part of the sclera. If a lot of blood collects under the conjunctiva, there may also be swelling. Subconjunctival hemorrhages do not affect your vision or cause pain, but your eye may feel irritated if there is swelling. Subconjunctival hemorrhages usually do not require treatment, and they disappear on their own within two weeks. °CAUSES °This condition may be caused by: °· Mild trauma, such as rubbing your eye too hard. °· Severe trauma or blunt injuries. °· Coughing, sneezing, or vomiting. °· Straining, such as when lifting a heavy object. °· High blood pressure. °· Recent eye surgery. °· A history of diabetes. °· Certain medicines, especially blood thinners (anticoagulants). °· Other conditions, such as eye tumors, bleeding disorders, or blood vessel abnormalities. °Subconjunctival hemorrhages can happen without an obvious cause.  °SYMPTOMS  °Symptoms of this condition include: °· A bright red or dark red patch on the white part of the eye. °¨ The red area may spread out to cover a larger area of the eye before it goes away. °¨ The red area may turn brownish-yellow before it goes away. °· Swelling. °· Mild eye irritation. °DIAGNOSIS °This condition is diagnosed with a physical exam. If your subconjunctival hemorrhage was caused by trauma, your health care provider may refer you to an eye specialist (ophthalmologist) or another specialist to check for other injuries. You  may have other tests, including: °· An eye exam. °· A blood pressure check. °· Blood tests to check for bleeding disorders. °If your subconjunctival hemorrhage was caused by trauma, X-rays or a CT scan may be done to check for other injuries. °TREATMENT °Usually, no treatment is needed. Your health care provider may recommend eye drops or cold compresses to help with discomfort. °HOME CARE INSTRUCTIONS °· Take over-the-counter and prescription medicines only as directed by your health care provider. °· Use eye drops or cold compresses to help with discomfort as directed by your health care provider. °· Avoid activities, things, and environments that may irritate or injure your eye. °· Keep all follow-up visits as told by your health care provider. This is important. °SEEK MEDICAL CARE IF: °· You have pain in your eye. °· The bleeding does not go away within 3 weeks. °· You keep getting new subconjunctival hemorrhages. °SEEK IMMEDIATE MEDICAL CARE IF: °· Your vision changes or you have difficulty seeing. °· You suddenly develop severe sensitivity to light. °· You develop a severe headache, persistent vomiting, confusion, or abnormal tiredness (lethargy). °· Your eye seems to bulge or protrude from your eye socket. °· You develop unexplained bruises on your body. °· You have unexplained bleeding in another area of your body. °  °This information is not intended to replace advice given to you by your health care provider. Make sure you discuss any questions you have with your health care provider. °  °Document Released: 01/03/2005 Document Revised: 09/24/2014 Document Reviewed: 03/12/2014 °Elsevier Interactive Patient Education ©2016 [employer] ° °

## 2014-12-20 ENCOUNTER — Other Ambulatory Visit: Payer: Self-pay | Admitting: Family Medicine

## 2014-12-20 NOTE — Telephone Encounter (Signed)
Medication filled to pharmacy as requested.   

## 2015-01-23 ENCOUNTER — Other Ambulatory Visit: Payer: Self-pay | Admitting: Family Medicine

## 2015-02-26 ENCOUNTER — Other Ambulatory Visit: Payer: Self-pay

## 2015-02-26 MED ORDER — MELOXICAM 15 MG PO TABS: 15.0000 mg | ORAL_TABLET | Freq: Every day | ORAL | Status: DC

## 2015-03-15 ENCOUNTER — Other Ambulatory Visit: Payer: Self-pay | Admitting: Family Medicine

## 2015-03-16 NOTE — Telephone Encounter (Signed)
Medication filled to pharmacy as requested.   

## 2015-04-20 ENCOUNTER — Ambulatory Visit (INDEPENDENT_AMBULATORY_CARE_PROVIDER_SITE_OTHER): Payer: Managed Care, Other (non HMO) | Admitting: Nurse Practitioner

## 2015-04-20 ENCOUNTER — Encounter: Payer: Self-pay | Admitting: Nurse Practitioner

## 2015-04-20 VITALS — BP 128/78 | HR 68 | Ht 66.0 in | Wt 187.8 lb

## 2015-04-20 DIAGNOSIS — G43019 Migraine without aura, intractable, without status migrainosus: Secondary | ICD-10-CM

## 2015-04-20 MED ORDER — PROPRANOLOL HCL 40 MG PO TABS: 40.0000 mg | ORAL_TABLET | Freq: Two times a day (BID) | ORAL | Status: DC

## 2015-04-20 NOTE — Progress Notes (Signed)
I have read the note, and I agree with the clinical assessment and plan.  WILLIS,CHARLES KEITH   

## 2015-04-20 NOTE — Patient Instructions (Signed)
Continue Propranol for migraine, will refill for 3 months F/U yearly and prn Call for increase in headaches 

## 2015-04-20 NOTE — Progress Notes (Signed)
[facility]  PATIENT: Madlynn A Depaoli DOB: 08-Jul-1965   REASON FOR VISIT: follow up for migraine HISTORY FROM:patient    HISTORY OF PRESENT ILLNESS:Ms. Yoak, 50 year old female returns for followup. She was last seen in this office 4/1/ 2016. She has a history of migraine headaches. Her headaches are currently well controlled on propanolol 40 mg twice daily. Her headaches are rare at this point. She may have 1  to 2  headaches per month. She returns for reevaluation. She needs refills on her medications. She has had no new medical problems since last seen    HISTORY:of migraine headaches. She was on Topamax and felt the headaches were worse. She is currently on propranolol twice a day and doing well. Her last migraine was over a month ago Her trigger some times is not eating correctly and due to hypoglycemia. She was taking Midrin but that drug has been taken off the market. Ultram has been tried and she thought made her headaches were worse. She is now taking Cambia with good effect. She continues to exercise, she has occasional joint pain, low back pain and constipation. She has lost a few pounds since last seen. No new neurological complaints   REVIEW OF SYSTEMS: Full 14 system review of systems performed and notable only for those listed, all others are neg:  Constitutional: neg  Cardiovascular: neg Ear/Nose/Throat: neg  Skin: neg Eyes: neg Respiratory: neg Gastroitestinal: neg  Hematology/Lymphatic: neg  Endocrine: neg Musculoskeletal:neg Allergy/Immunology: neg Neurological: neg Psychiatric: neg Sleep : neg   ALLERGIES: Allergies  Allergen Reactions  . Seasonal Ic [Cholestatin]     Pt states no allergies to medications, unsure of reaction if any    HOME MEDICATIONS: Outpatient Prescriptions Prior to Visit  Medication Sig Dispense Refill  . atorvastatin (LIPITOR) 10 MG tablet TAKE 1 TABLET (10 MG TOTAL) BY MOUTH DAILY. 90 tablet 1    . Calcium Carbonate-Vitamin D (CALCIUM 600+D3) 600-400 MG-UNIT per tablet Take 1 tablet by mouth daily.    . cyclobenzaprine (FLEXERIL) 10 MG tablet TAKE 1 TABLET BY MOUTH 3 TIMES DAILY AS NEEDED FOR SEVER PAIN ONLY 90 tablet 0  . meloxicam (MOBIC) 15 MG tablet Take 1 tablet (15 mg total) by mouth daily. 30 tablet 3  . Multiple Vitamin (MULTIVITAMIN) tablet Take 1 tablet by mouth daily.    . propranolol (INDERAL) 40 MG tablet Take 1 tablet (40 mg total) by mouth 2 (two) times daily. 180 tablet 3   No facility-administered medications prior to visit.    PAST MEDICAL HISTORY: Past Medical History  Diagnosis Date  . Headache(784.0)   . Hyperlipidemia     PAST SURGICAL HISTORY: No past surgical history on file.  FAMILY HISTORY: No family history on file.  SOCIAL HISTORY: Social History   Social History  . Marital Status: Married    Spouse Name: [name]  . Number of Children: 2  . Years of Education: 12   Occupational History  .  [employer]   Social History Main Topics  . Smoking status: Never Smoker   . Smokeless tobacco: Never Used  . Alcohol Use: No  . Drug Use: No  . Sexual Activity: Yes   Other Topics Concern  . Not on file   Social History Narrative   Patient is right handed, resides with husband([name]),two children in a home.     PHYSICAL EXAM  Filed Vitals:   04/20/15 0823  BP: 128/78  Pulse: 68  Height: 5\' 6"  (1.676 m)  Weight: 187 lb 12.8 oz (85.186 kg)   Body mass index is 30.33 kg/(m^2). Generalized: Well developed, in no acute distress  Head: normocephalic and atraumatic,. Oropharynx benign  Neck: Supple Neurological examination   Mentation: Alert oriented to time, place, history taking. Attention span and concentration appropriate. Recent and remote memory intact. Follows all commands speech and language fluent.   Cranial nerve II-XII: Pupils were equal round reactive to light extraocular movements were full, visual field were full on  confrontational test. Facial sensation and strength were normal. hearing was intact to finger rubbing bilaterally. Uvula tongue midline. head turning and shoulder shrug were normal and symmetric.Tongue protrusion into cheek strength was normal. Motor: normal bulk and tone, full strength in the BUE, BLE, fine finger movements normal, no pronator drift. No focal weakness Coordination: finger-nose-finger, heel-to-shin bilaterally, no dysmetria Reflexes: Brachioradialis 2/2, biceps 2/2, triceps 2/2, patellar 2/2, Achilles 2/2, plantar responses were flexor bilaterally. Gait and Station: Rising up from seated position without assistance, normal stance, moderate stride, good arm swing, smooth turning, able to perform tiptoe, and heel walking without difficulty. Tandem gait is steady  DIAGNOSTIC DATA (LABS, IMAGING, TESTING) -    Component Value Date/Time   NA 136 11/17/2014 0913   K 4.0 11/17/2014 0913   CL 100 11/17/2014 0913   CO2 29 11/17/2014 0913   GLUCOSE 96 11/17/2014 0913   GLUCOSE 90 12/27/2005 1118   BUN 10 11/17/2014 0913   CREATININE 0.74 11/17/2014 0913   CALCIUM 9.8 11/17/2014 0913   PROT 8.0 11/17/2014 0913   ALBUMIN 3.9 11/17/2014 0913   AST 19 11/17/2014 0913   ALT 17 11/17/2014 0913   ALKPHOS 80 11/17/2014 0913   BILITOT 0.9 11/17/2014 0913   GFRNONAA 122.03 07/08/2009 0836   Lab Results  Component Value Date   CHOL 180 11/17/2014   HDL 82.60 11/17/2014   LDLCALC 88 11/17/2014   LDLDIRECT 127.9 12/05/2011   TRIG 46.0 11/17/2014   CHOLHDL 2 11/17/2014    Lab Results  Component Value Date   TSH 0.84 11/17/2014      ASSESSMENT AND PLAN  50 y.o. year old female  has a past medical history of Headache(784.0) and Hyperlipidemia. here to follow-up for her migraines. She has at most 1-2 per month well-controlled on propanolol.  Continue Propranol for migraine, will refill  F/U yearly and prn Call for increase in headache [name], [facility], [facility],  [facility] [address], [place], [place] [address] [phone]

## 2015-04-28 ENCOUNTER — Other Ambulatory Visit: Payer: Self-pay | Admitting: Internal Medicine

## 2015-04-29 NOTE — Telephone Encounter (Signed)
Pt has appt 05/15/15 to establish. Please advise

## 2015-05-15 ENCOUNTER — Encounter: Payer: Self-pay | Admitting: Internal Medicine

## 2015-05-15 ENCOUNTER — Other Ambulatory Visit (INDEPENDENT_AMBULATORY_CARE_PROVIDER_SITE_OTHER): Payer: Managed Care, Other (non HMO)

## 2015-05-15 ENCOUNTER — Ambulatory Visit (INDEPENDENT_AMBULATORY_CARE_PROVIDER_SITE_OTHER): Payer: Managed Care, Other (non HMO) | Admitting: Internal Medicine

## 2015-05-15 VITALS — BP 136/82 | HR 65 | Temp 98.6°F | Resp 16 | Ht 66.0 in | Wt 187.0 lb

## 2015-05-15 DIAGNOSIS — Z0000 Encounter for general adult medical examination without abnormal findings: Secondary | ICD-10-CM

## 2015-05-15 DIAGNOSIS — M545 Low back pain, unspecified: Secondary | ICD-10-CM

## 2015-05-15 DIAGNOSIS — Z780 Asymptomatic menopausal state: Secondary | ICD-10-CM | POA: Insufficient documentation

## 2015-05-15 DIAGNOSIS — E785 Hyperlipidemia, unspecified: Secondary | ICD-10-CM

## 2015-05-15 DIAGNOSIS — G43019 Migraine without aura, intractable, without status migrainosus: Secondary | ICD-10-CM

## 2015-05-15 DIAGNOSIS — K5900 Constipation, unspecified: Secondary | ICD-10-CM

## 2015-05-15 LAB — CBC WITH DIFFERENTIAL/PLATELET
BASOS ABS: 0 10*3/uL (ref 0.0–0.1)
Basophils Relative: 0.5 % (ref 0.0–3.0)
EOS PCT: 4.7 % (ref 0.0–5.0)
Eosinophils Absolute: 0.2 10*3/uL (ref 0.0–0.7)
HCT: 36 % (ref 36.0–46.0)
HEMOGLOBIN: 12.1 g/dL (ref 12.0–15.0)
LYMPHS ABS: 1.8 10*3/uL (ref 0.7–4.0)
LYMPHS PCT: 41.4 % (ref 12.0–46.0)
MCHC: 33.5 g/dL (ref 30.0–36.0)
MCV: 91.4 fl (ref 78.0–100.0)
MONOS PCT: 7.3 % (ref 3.0–12.0)
Monocytes Absolute: 0.3 10*3/uL (ref 0.1–1.0)
NEUTROS PCT: 46.1 % (ref 43.0–77.0)
Neutro Abs: 2 10*3/uL (ref 1.4–7.7)
Platelets: 177 10*3/uL (ref 150.0–400.0)
RBC: 3.93 Mil/uL (ref 3.87–5.11)
RDW: 14.1 % (ref 11.5–15.5)
WBC: 4.4 10*3/uL (ref 4.0–10.5)

## 2015-05-15 LAB — COMPREHENSIVE METABOLIC PANEL
ALK PHOS: 64 U/L (ref 39–117)
ALT: 16 U/L (ref 0–35)
AST: 18 U/L (ref 0–37)
Albumin: 3.8 g/dL (ref 3.5–5.2)
BILIRUBIN TOTAL: 0.9 mg/dL (ref 0.2–1.2)
BUN: 14 mg/dL (ref 6–23)
CO2: 32 mEq/L (ref 19–32)
Calcium: 9.4 mg/dL (ref 8.4–10.5)
Chloride: 101 mEq/L (ref 96–112)
Creatinine, Ser: 0.67 mg/dL (ref 0.40–1.20)
GFR: 120.9 mL/min (ref >60.00)
GLUCOSE: 88 mg/dL (ref 70–99)
POTASSIUM: 3.8 meq/L (ref 3.5–5.1)
Sodium: 135 mEq/L (ref 135–145)
TOTAL PROTEIN: 7.5 g/dL (ref 6.0–8.3)

## 2015-05-15 LAB — LIPID PANEL
CHOL/HDL RATIO: 2
CHOLESTEROL: 168 mg/dL (ref 0–200)
HDL: 73.8 mg/dL (ref >39.00)
LDL Cholesterol: 84 mg/dL (ref 0–99)
NONHDL: 93.81
Triglycerides: 51 mg/dL (ref 0.0–149.0)
VLDL: 10.2 mg/dL (ref 0.0–40.0)

## 2015-05-15 LAB — TSH: TSH: 0.58 u[IU]/mL (ref 0.35–4.50)

## 2015-05-15 MED ORDER — ATORVASTATIN CALCIUM 10 MG PO TABS: ORAL_TABLET | ORAL | Status: DC

## 2015-05-15 MED ORDER — CYCLOBENZAPRINE HCL 10 MG PO TABS: ORAL_TABLET | ORAL | Status: DC

## 2015-05-15 MED ORDER — MELOXICAM 15 MG PO TABS: 15.0000 mg | ORAL_TABLET | Freq: Every day | ORAL | Status: DC

## 2015-05-15 NOTE — Assessment & Plan Note (Signed)
Chronic, controlled with diet

## 2015-05-15 NOTE — Assessment & Plan Note (Addendum)
Takes propranolol twice daily Takes excedrin migraine as needed Gets migraines once a month Continue above medication

## 2015-05-15 NOTE — Progress Notes (Signed)
Subjective:    Patient ID: Mary [name], female    DOB: [birth year], 50 y.o.   MRN: [id number]  HPI She is here to establish with a new pcp.  She is here for a physical exam.   She is worried about side effects from long term use of the medication she is on.  She is taking her medication daily as prescribed. She denies any side effects from medication and feels that they work well.  She is eating fairly healthy.  She is exercising regularly.  She denies any changes in her health or family health since she was here last.  Medications and allergies reviewed with patient and updated if appropriate.  Patient Active Problem List   Diagnosis Date Noted  . Subconjunctival hemorrhage of right eye 12/05/2014  . Hyperlipidemia 11/17/2014  . 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 Obesity 11/17/2014  . Intractable migraine without aura 04/26/2013  . Lumbar back pain 04/09/2013  . Knee pain, right 04/09/2013  . Screening for malignant neoplasm of cervix 12/05/2011  . Encounter for routine gynecological examination 12/05/2011  . Amenorrhea 12/05/2011  . RINGWORM 07/08/2009  . CONSTIPATION 07/08/2009  . HOT FLASHES 07/08/2009    Current Outpatient Prescriptions on File Prior to Visit  Medication Sig Dispense Refill  . Aspirin-Acetaminophen-Caffeine (EXCEDRIN MIGRAINE PO) Take by mouth as needed.    Marland Kitchen atorvastatin (LIPITOR) 10 MG tablet TAKE 1 TABLET (10 MG TOTAL) BY MOUTH DAILY. 90 tablet 1  . Calcium Carbonate-Vitamin D (CALCIUM 600+D3) 600-400 MG-UNIT per tablet Take 1 tablet by mouth daily.    . cyclobenzaprine (FLEXERIL) 10 MG tablet TAKE 1 TABLET BY MOUTH 3 TIMES A DAY AS NEEDED FOR SEVERE PAIN ONLY 90 tablet 0  . meloxicam (MOBIC) 15 MG tablet Take 1 tablet (15 mg total) by mouth daily. 30 tablet 3  . Multiple Vitamin (MULTIVITAMIN) tablet Take 1 tablet by mouth daily.    . propranolol (INDERAL) 40 MG tablet Take 1 tablet (40 mg total) by mouth 2 (two) times daily. 180 tablet 3   No  current facility-administered medications on file prior to visit.    Past Medical History  Diagnosis Date  . Headache(784.0)   . Hyperlipidemia     Past Surgical History  Procedure Laterality Date  . Cesarean section      Social History   Social History  . Marital Status: Married    Spouse Name: [name]  . Number of Children: 2  . Years of Education: 12   Occupational History  .  [employer]   Social History Main Topics  . Smoking status: Never Smoker   . Smokeless tobacco: Never Used  . Alcohol Use: No  . Drug Use: No  . Sexual Activity: Yes   Other Topics Concern  . None   Social History Narrative   Patient is right handed, resides with husband([name]),two children in a home.      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 Migraines Mother     Review of Systems  Constitutional: Negative for fever, chills, appetite change and fatigue.  HENT: Negative for hearing loss.   Eyes: Negative for visual disturbance.  Respiratory: Negative for cough, shortness of breath and wheezing.   Cardiovascular: Negative for chest pain, palpitations and leg swelling.  Gastrointestinal: Positive for nausea (with migraines) and constipation. Negative for abdominal pain, diarrhea and blood in stool.       No gerd  Genitourinary: Negative for dysuria and hematuria.  Musculoskeletal: Positive for back pain. Negative for myalgias.  Skin: Negative for color change and rash.  Neurological: Positive for dizziness (with migraines), light-headedness (with migraines) and headaches (migraines).  Psychiatric/Behavioral: Negative for dysphoric mood. The patient is not nervous/anxious.        Objective:   Filed Vitals:   05/15/15 1047  BP: 136/82  Pulse: 65  Temp: 98.6 F (37 C)  Resp: 16   Filed Weights   05/15/15 1047  Weight: 187 lb (84.823 kg)   Body mass index is 30.2 kg/(m^2).   Physical Exam Constitutional: She appears  well-developed and well-nourished. No distress.  HENT:  Head: Normocephalic and atraumatic.  Right Ear: External ear normal. Normal ear canal and TM Left Ear: External ear normal.  Normal ear canal and TM Mouth/Throat: Oropharynx is clear and moist.  Eyes: Conjunctivae and EOM are normal.  Neck: Neck supple. No tracheal deviation present. Mild thyromegaly present.  No carotid bruit  Cardiovascular: Normal rate, regular rhythm and normal heart sounds.   No murmur heard.  No edema. Pulmonary/Chest: Effort normal and breath sounds normal. No respiratory distress. She has no wheezes. She has no rales.  Breast: Normal bilateral breast exam-no skin changes, nipple discharge, lumps or axillary lymphadenopathy bilaterally Abdominal: Soft. She exhibits no distension. There is no tenderness.  Lymphadenopathy: She has no cervical adenopathy.  Skin: Skin is warm and dry. She is not diaphoretic.  Psychiatric: She has a normal mood and affect. Her behavior is normal.       Assessment & Plan:   Physical exam: Screening blood work Ordered Immunizations-up-to-date Mammogram-up-to-date  Gyn - will establish Eye exams - up to date Exercise-exercising regularly Weight-recommended working on weight loss Skin -no concerns Substance abuse-no evidence of abuse   See Problem List for Assessment and Plan of chronic medical problems.  Follow-up annually, sooner if needed

## 2015-05-15 NOTE — Progress Notes (Signed)
Pre visit review using our clinic review tool, if applicable. No additional management support is needed unless otherwise documented below in the visit note. 

## 2015-05-15 NOTE — Assessment & Plan Note (Signed)
Chronic, intermittent back pain Flexeril at night mobic daily  Pain controlled

## 2015-05-15 NOTE — Assessment & Plan Note (Signed)
Taking lovastatin 10 mg daily Exercising regularly and eating a healthy diet Check lipid panel, CMP

## 2015-05-15 NOTE — Patient Instructions (Addendum)
[facility] [address] [place], [place] Across the street from [place]  [place], [place]   Test(s) ordered today. Your results will be released to [facility] (or called to you) after review, usually within 72hours after test completion. If any changes need to be made, you will be notified at that same time.  All other Health Maintenance issues reviewed.   All recommended immunizations and age-appropriate screenings are up-to-date or discussed.  No immunizations administered today.   Medications reviewed and updated.  No changes recommended at this time.  Your prescription(s) have been submitted to your pharmacy. Please take as directed and contact our office if you believe you are having problem(s) with the medication(s).   Please followup in one year for a physical exam.   Health Maintenance, Female Adopting a healthy lifestyle and getting preventive care can go a long way to promote health and wellness. Talk with your health care provider about what schedule of regular examinations is right for you. This is a good chance for you to check in with your provider about disease prevention and staying healthy. In between checkups, there are plenty of things you can do on your own. Experts have done a lot of research about which lifestyle changes and preventive measures are most likely to keep you healthy. Ask your health care provider for more information. WEIGHT AND DIET  Eat a healthy diet  Be sure to include plenty of vegetables, fruits, low-fat dairy products, and lean protein.  Do not eat a lot of foods high in solid fats, added sugars, or salt.  Get regular exercise. This is one of the most important things you can do for your health.  Most adults should exercise for at least 150 minutes each week. The  exercise should increase your heart rate and make you sweat (moderate-intensity exercise).  Most adults should also do strengthening exercises at least twice a week. This is in addition to the moderate-intensity exercise.  Maintain a healthy weight  Body mass index (BMI) is a measurement that can be used to identify possible weight problems. It estimates body fat based on height and weight. Your health care provider can help determine your BMI and help you achieve or maintain a healthy weight.  For females 58 years of age and older:   A BMI below 18.5 is considered underweight.  A BMI of 18.5 to 24.9 is normal.  A BMI of 25 to 29.9 is considered overweight.  A BMI of 30 and above is considered obese.  Watch levels of cholesterol and blood lipids  You should start having your blood tested for lipids and cholesterol at 48 years of age, then have this test every 5 years.  You may need to have your cholesterol levels checked more often if:  Your lipid or cholesterol levels are high.  You are older than 48 years of age.  You are at high risk for heart disease.  CANCER SCREENING   Lung Cancer  Lung cancer screening is recommended for adults 72-6 years old who are at high risk for lung cancer because of a history of smoking.  A yearly low-dose CT scan of the lungs is recommended for people who:  Currently smoke.  Have quit within the past 15 years.  Have at least a 30-pack-year history of smoking. A pack year is smoking an average of one  pack of cigarettes a day for 1 year.  Yearly screening should continue until it has been 15 years since you quit.  Yearly screening should stop if you develop a health problem that would prevent you from having lung cancer treatment.  Breast Cancer  Practice breast self-awareness. This means understanding how your breasts normally appear and feel.  It also means doing regular breast self-exams. Let your health care provider know about  any changes, no matter how small.  If you are in your 20s or 30s, you should have a clinical breast exam (CBE) by a health care provider every 1-3 years as part of a regular health exam.  If you are 60 or older, have a CBE every year. Also consider having a breast X-ray (mammogram) every year.  If you have a family history of breast cancer, talk to your health care provider about genetic screening.  If you are at high risk for breast cancer, talk to your health care provider about having an MRI and a mammogram every year.  Breast cancer gene (BRCA) assessment is recommended for women who have family members with BRCA-related cancers. BRCA-related cancers include:  Breast.  Ovarian.  Tubal.  Peritoneal cancers.  Results of the assessment will determine the need for genetic counseling and BRCA1 and BRCA2 testing. Cervical Cancer Your health care provider may recommend that you be screened regularly for cancer of the pelvic organs (ovaries, uterus, and vagina). This screening involves a pelvic examination, including checking for microscopic changes to the surface of your cervix (Pap test). You may be encouraged to have this screening done every 3 years, beginning at age 15.  For women ages 33-65, health care providers may recommend pelvic exams and Pap testing every 3 years, or they may recommend the Pap and pelvic exam, combined with testing for human papilloma virus (HPV), every 5 years. Some types of HPV increase your risk of cervical cancer. Testing for HPV may also be done on women of any age with unclear Pap test results.  Other health care providers may not recommend any screening for nonpregnant women who are considered low risk for pelvic cancer and who do not have symptoms. Ask your health care provider if a screening pelvic exam is right for you.  If you have had past treatment for cervical cancer or a condition that could lead to cancer, you need Pap tests and screening for  cancer for at least 20 years after your treatment. If Pap tests have been discontinued, your risk factors (such as having a new sexual partner) need to be reassessed to determine if screening should resume. Some women have medical problems that increase the chance of getting cervical cancer. In these cases, your health care provider may recommend more frequent screening and Pap tests. Colorectal Cancer  This type of cancer can be detected and often prevented.  Routine colorectal cancer screening usually begins at 48 years of age and continues through 48 years of age.  Your health care provider may recommend screening at an earlier age if you have risk factors for colon cancer.  Your health care provider may also recommend using home test kits to check for hidden blood in the stool.  A small camera at the end of a tube can be used to examine your colon directly (sigmoidoscopy or colonoscopy). This is done to check for the earliest forms of colorectal cancer.  Routine screening usually begins at age 75.  Direct examination of the colon should be repeated every  5-10 years through 48 years of age. However, you may need to be screened more often if early forms of precancerous polyps or small growths are found. Skin Cancer  Check your skin from head to toe regularly.  Tell your health care provider about any new moles or changes in moles, especially if there is a change in a mole's shape or color.  Also tell your health care provider if you have a mole that is larger than the size of a pencil eraser.  Always use sunscreen. Apply sunscreen liberally and repeatedly throughout the day.  Protect yourself by wearing long sleeves, pants, a wide-brimmed hat, and sunglasses whenever you are outside. HEART DISEASE, DIABETES, AND HIGH BLOOD PRESSURE   High blood pressure causes heart disease and increases the risk of stroke. High blood pressure is more likely to develop in:  People who have blood  pressure in the high end of the normal range (130-139/85-89 mm Hg).  People who are overweight or obese.  People who are African American.  If you are 70-74 years of age, have your blood pressure checked every 3-5 years. If you are 58 years of age or older, have your blood pressure checked every year. You should have your blood pressure measured twice--once when you are at a hospital or clinic, and once when you are not at a hospital or clinic. Record the average of the two measurements. To check your blood pressure when you are not at a hospital or clinic, you can use:  An automated blood pressure machine at a pharmacy.  A home blood pressure monitor.  If you are between 10 years and 24 years old, ask your health care provider if you should take aspirin to prevent strokes.  Have regular diabetes screenings. This involves taking a blood sample to check your fasting blood sugar level.  If you are at a normal weight and have a low risk for diabetes, have this test once every three years after 48 years of age.  If you are overweight and have a high risk for diabetes, consider being tested at a younger age or more often. PREVENTING INFECTION  Hepatitis B  If you have a higher risk for hepatitis B, you should be screened for this virus. You are considered at high risk for hepatitis B if:  You were born in a country where hepatitis B is common. Ask your health care provider which countries are considered high risk.  Your parents were born in a high-risk country, and you have not been immunized against hepatitis B (hepatitis B vaccine).  You have HIV or AIDS.  You use needles to inject street drugs.  You live with someone who has hepatitis B.  You have had sex with someone who has hepatitis B.  You get hemodialysis treatment.  You take certain medicines for conditions, including cancer, organ transplantation, and autoimmune conditions. Hepatitis C  Blood testing is recommended  for:  Everyone born from [birth year] through 1963.  Anyone with known risk factors for hepatitis C. Sexually transmitted infections (STIs)  You should be screened for sexually transmitted infections (STIs) including gonorrhea and chlamydia if:  You are sexually active and are younger than 48 years of age.  You are older than 48 years of age and your health care provider tells you that you are at risk for this type of infection.  Your sexual activity has changed since you were last screened and you are at an increased risk for chlamydia or gonorrhea. Ask  your health care provider if you are at risk.  If you do not have HIV, but are at risk, it may be recommended that you take a prescription medicine daily to prevent HIV infection. This is called pre-exposure prophylaxis (PrEP). You are considered at risk if:  You are sexually active and do not regularly use condoms or know the HIV status of your partner(s).  You take drugs by injection.  You are sexually active with a partner who has HIV. Talk with your health care provider about whether you are at high risk of being infected with HIV. If you choose to begin PrEP, you should first be tested for HIV. You should then be tested every 3 months for as long as you are taking PrEP.  PREGNANCY   If you are premenopausal and you may become pregnant, ask your health care provider about preconception counseling.  If you may become pregnant, take 400 to 800 micrograms (mcg) of folic acid every day.  If you want to prevent pregnancy, talk to your health care provider about birth control (contraception). OSTEOPOROSIS AND MENOPAUSE   Osteoporosis is a disease in which the bones lose minerals and strength with aging. This can result in serious bone fractures. Your risk for osteoporosis can be identified using a bone density scan.  If you are 78 years of age or older, or if you are at risk for osteoporosis and fractures, ask your health care provider if you  should be screened.  Ask your health care provider whether you should take a calcium or vitamin D supplement to lower your risk for osteoporosis.  Menopause may have certain physical symptoms and risks.  Hormone replacement therapy may reduce some of these symptoms and risks. Talk to your health care provider about whether hormone replacement therapy is right for you.  HOME CARE INSTRUCTIONS   Schedule regular health, dental, and eye exams.  Stay current with your immunizations.   Do not use any tobacco products including cigarettes, chewing tobacco, or electronic cigarettes.  If you are pregnant, do not drink alcohol.  If you are breastfeeding, limit how much and how often you drink alcohol.  Limit alcohol intake to no more than 1 drink per day for nonpregnant women. One drink equals 12 ounces of beer, 5 ounces of wine, or 1 ounces of hard liquor.  Do not use street drugs.  Do not share needles.  Ask your health care provider for help if you need support or information about quitting drugs.  Tell your health care provider if you often feel depressed.  Tell your health care provider if you have ever been abused or do not feel safe at home.   This information is not intended to replace advice given to you by your health care provider. Make sure you discuss any questions you have with your health care provider.   Document Released: 07/19/2010 Document Revised: 01/24/2014 Document Reviewed: 12/05/2012 Elsevier Interactive Patient Education [employer].

## 2015-05-20 ENCOUNTER — Encounter: Payer: Managed Care, Other (non HMO) | Admitting: Family Medicine

## 2015-05-28 ENCOUNTER — Other Ambulatory Visit: Payer: Self-pay | Admitting: Nurse Practitioner

## 2015-07-08 ENCOUNTER — Other Ambulatory Visit: Payer: Self-pay | Admitting: General Practice

## 2015-07-08 MED ORDER — MELOXICAM 15 MG PO TABS: 15.0000 mg | ORAL_TABLET | Freq: Every day | ORAL | Status: DC

## 2015-07-08 NOTE — Telephone Encounter (Signed)
Received a refill request today for patient's meloxicam. Pt has switched providers refill routed to new PCP.

## 2015-10-04 ENCOUNTER — Other Ambulatory Visit: Payer: Self-pay | Admitting: Internal Medicine

## 2015-10-05 NOTE — Telephone Encounter (Signed)
Please advise, last refill 08/07/15 #30 with 3 refills

## 2015-12-21 ENCOUNTER — Ambulatory Visit (INDEPENDENT_AMBULATORY_CARE_PROVIDER_SITE_OTHER): Payer: Managed Care, Other (non HMO) | Admitting: Internal Medicine

## 2015-12-21 ENCOUNTER — Encounter: Payer: Self-pay | Admitting: Internal Medicine

## 2015-12-21 VITALS — BP 120/90 | HR 88 | Wt 188.0 lb

## 2015-12-21 DIAGNOSIS — M5416 Radiculopathy, lumbar region: Secondary | ICD-10-CM | POA: Diagnosis not present

## 2015-12-21 DIAGNOSIS — G8929 Other chronic pain: Secondary | ICD-10-CM | POA: Insufficient documentation

## 2015-12-21 DIAGNOSIS — M545 Low back pain: Secondary | ICD-10-CM

## 2015-12-21 MED ORDER — GABAPENTIN 100 MG PO CAPS: 200.0000 mg | ORAL_CAPSULE | Freq: Every day | ORAL | 3 refills | Status: DC

## 2015-12-21 NOTE — Progress Notes (Signed)
Subjective:    Patient ID: Mary [name], female    DOB: [birth year], 50 y.o.   MRN: [id number]  HPI She is here for an acute visit.   Leg pain:  Her pain started last week.  It is in her left lower back and radiates to the posterior thigh.  She denies numbness/tingling and weakness in the leg.  She denies muscle spasms, but is taking the flexeril at night. There was no obvious cause for the pain.  The pain is sharp.  Walking, bending and working makes the pain worse.  Sitting down, laying down and advil/meloxican make it better.  This is different from her chronic back pain, which is usually in the middle lower back and does not radiate.  Pain is 9/10 w/o pain medication and 5-6/10 with medication.    Hand pain, right: She has pain in her posterior aspect of her right hand near her wrist.  She feels it at night or first thing in the morning only.  She denies pain during the day.  She denies left hand pain.  She will have some mild swelling in the morning when she wakes, but none during the day. She usually sleeps on her right hand.    Medications and allergies reviewed with patient and updated if appropriate.  Patient Active Problem List   Diagnosis Date Noted  . Postmenopausal 05/15/2015  . Hyperlipidemia 11/17/2014  . 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 Obesity 11/17/2014  . Intractable migraine without aura 04/26/2013  . Lumbar back pain 04/09/2013  . Knee pain, right 04/09/2013  . Constipation 07/08/2009  . HOT FLASHES 07/08/2009    Current Outpatient Prescriptions on File Prior to Visit  Medication Sig Dispense Refill  . Aspirin-Acetaminophen-Caffeine (EXCEDRIN MIGRAINE PO) Take by mouth as needed.    Marland Kitchen atorvastatin (LIPITOR) 10 MG tablet TAKE 1 TABLET (10 MG TOTAL) BY MOUTH DAILY. 90 tablet 3  . Calcium Carbonate-Vitamin D (CALCIUM 600+D3) 600-400 MG-UNIT per tablet Take 1 tablet by mouth daily.    . cyclobenzaprine (FLEXERIL) 10 MG tablet TAKE 1 TABLET BY MOUTH 3 TIMES A DAY AS  NEEDED FOR SEVERE PAIN ONLY 90 tablet 3  . meloxicam (MOBIC) 15 MG tablet TAKE 1 TABLET BY MOUTH EVERY DAY 30 tablet 3  . Multiple Vitamin (MULTIVITAMIN) tablet Take 1 tablet by mouth daily.    . propranolol (INDERAL) 40 MG tablet Take 1 tablet (40 mg total) by mouth 2 (two) times daily. 180 tablet 3   No current facility-administered medications on file prior to visit.     Past Medical History:  Diagnosis Date  . Headache(784.0)   . Hyperlipidemia     Past Surgical History:  Procedure Laterality Date  . CESAREAN SECTION      Social History   Social History  . Marital status: Married    Spouse name: [name]  . Number of children: 2  . Years of education: 12   Occupational History  .  [employer]   Social History Main Topics  . Smoking status: Never Smoker  . Smokeless tobacco: Never Used  . Alcohol use No  . Drug use: No  . Sexual activity: Yes   Other Topics Concern  . None   Social History Narrative   Patient is right handed, resides with husband([name]),two children in a home.      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 Migraines Mother  Review of Systems  Constitutional: Negative for fever.  Gastrointestinal:       No change in stool/fecal incontinence  Genitourinary:       No change in urination/ urinary incontinence  Musculoskeletal: Positive for back pain.  Neurological: Negative for weakness and numbness.       Objective:   Vitals:   12/21/15 1455  BP: 120/90  Pulse: 88   Filed Weights   12/21/15 1455  Weight: 188 lb (85.3 kg)   Body mass index is 30.34 kg/m.   Physical Exam  Constitutional: She appears well-developed and well-nourished. No distress.  Musculoskeletal: She exhibits no edema.  No pain with palpation left lower back or lumbar spine.  Increased pain with flexion and extension of back.   Neurological:  Normal strength and sensation in legs. Normal gait.  Skin: Skin is warm  and dry. No rash noted. She is not diaphoretic.          Assessment & Plan:   See Problem List for Assessment and Plan of chronic medical problems.

## 2015-12-21 NOTE — Patient Instructions (Addendum)
Try using ice and heat.  Avoid bending, twisting and lifting.  Medications reviewed and updated.  Changes include adding gabapentin for your back and leg pain.   Your prescription(s) have been submitted to your pharmacy. Please take as directed and contact our office if you believe you are having problem(s) with the medication(s).   Please followup as needed  A note was given for work.     Sciatica Sciatica is pain, numbness, weakness, or tingling along the path of the sciatic nerve. The sciatic nerve starts in the lower back and runs down the back of each leg. The nerve controls the muscles in the lower leg and in the back of the knee. It also provides feeling (sensation) to the back of the thigh, the lower leg, and the sole of the foot. Sciatica is a symptom of another medical condition that pinches or puts pressure on the sciatic nerve. Generally, sciatica only affects one side of the body. Sciatica usually goes away on its own or with treatment. In some cases, sciatica may keep coming back (recur). What are the causes? This condition is caused by pressure on the sciatic nerve, or pinching of the sciatic nerve. This may be the result of:  A disk in between the bones of the spine (vertebrae) bulging out too far (herniated disk).  Age-related changes in the spinal disks (degenerative disk disease).  A pain disorder that affects a muscle in the buttock (piriformis syndrome).  Extra bone growth (bone spur) near the sciatic nerve.  An injury or break (fracture) of the pelvis.  Pregnancy.  Tumor (rare). What increases the risk? The following factors may make you more likely to develop this condition:  Playing sports that place pressure or stress on the spine, such as football or weight lifting.  Having poor strength and flexibility.  A history of back injury.  A history of back surgery.  Sitting for long periods of time.  Doing activities that involve repetitive bending or  lifting.  Obesity. What are the signs or symptoms? Symptoms can vary from mild to very severe, and they may include:  Any of these problems in the lower back, leg, hip, or buttock:  Mild tingling or dull aches.  Burning sensations.  Sharp pains.  Numbness in the back of the calf or the sole of the foot.  Leg weakness.  Severe back pain that makes movement difficult. These symptoms may get worse when you cough, sneeze, or laugh, or when you sit or stand for long periods of time. Being overweight may also make symptoms worse. In some cases, symptoms may recur over time. How is this diagnosed? This condition may be diagnosed based on:  Your symptoms.  A physical exam. Your health care provider may ask you to do certain movements to check whether those movements trigger your symptoms.  You may have tests, including:  Blood tests.  X-rays.  MRI.  CT scan. How is this treated? In many cases, this condition improves on its own, without any treatment. However, treatment may include:  Reducing or modifying physical activity during periods of pain.  Exercising and stretching to strengthen your abdomen and improve the flexibility of your spine.  Icing and applying heat to the affected area.  Medicines that help:  To relieve pain and swelling.  To relax your muscles.  Injections of medicines that help to relieve pain, irritation, and inflammation around the sciatic nerve (steroids).  Surgery. Follow these instructions at home: Medicines  Take over-the-counter and  prescription medicines only as told by your health care provider.  Do not drive or operate heavy machinery while taking prescription pain medicine. Managing pain  If directed, apply ice to the affected area.  Put ice in a plastic bag.  Place a towel between your skin and the bag.  Leave the ice on for 20 minutes, 2-3 times a day.  After icing, apply heat to the affected area before you exercise or as  often as told by your health care provider. Use the heat source that your health care provider recommends, such as a moist heat pack or a heating pad.  Place a towel between your skin and the heat source.  Leave the heat on for 20-30 minutes.  Remove the heat if your skin turns bright red. This is especially important if you are unable to feel pain, heat, or cold. You may have a greater risk of getting burned. Activity  Return to your normal activities as told by your health care provider. Ask your health care provider what activities are safe for you.  Avoid activities that make your symptoms worse.  Take brief periods of rest throughout the day. Resting in a lying or standing position is usually better than sitting to rest.  When you rest for longer periods, mix in some mild activity or stretching between periods of rest. This will help to prevent stiffness and pain.  Avoid sitting for long periods of time without moving. Get up and move around at least one time each hour.  Exercise and stretch regularly, as told by your health care provider.  Do not lift anything that is heavier than 10 lb (4.5 kg) while you have symptoms of sciatica. When you do not have symptoms, you should still avoid heavy lifting, especially repetitive heavy lifting.  When you lift objects, always use proper lifting technique, which includes:  Bending your knees.  Keeping the load close to your body.  Avoiding twisting. General instructions  Use good posture.  Avoid leaning forward while sitting.  Avoid hunching over while standing.  Maintain a healthy weight. Excess weight puts extra stress on your back and makes it difficult to maintain good posture.  Wear supportive, comfortable shoes. Avoid wearing high heels.  Avoid sleeping on a mattress that is too soft or too hard. A mattress that is firm enough to support your back when you sleep may help to reduce your pain.  Keep all follow-up visits as  told by your health care provider. This is important. Contact a health care provider if:  You have pain that wakes you up when you are sleeping.  You have pain that gets worse when you lie down.  Your pain is worse than you have experienced in the past.  Your pain lasts longer than 4 weeks.  You experience unexplained weight loss. Get help right away if:  You lose control of your bowel or bladder (incontinence).  You have:  Weakness in your lower back, pelvis, buttocks, or legs that gets worse.  Redness or swelling of your back.  A burning sensation when you urinate. This information is not intended to replace advice given to you by your health care provider. Make sure you discuss any questions you have with your health care provider. Document Released: 12/28/2000 Document Revised: 06/09/2015 Document Reviewed: 09/12/2014 Elsevier Interactive Patient Education  2017 [place].    Sciatica Rehab Ask your health care provider which exercises are safe for you. Do exercises exactly as told by your  health care provider and adjust them as directed. It is normal to feel mild stretching, pulling, tightness, or discomfort as you do these exercises, but you should stop right away if you feel sudden pain or your pain gets worse.Do not begin these exercises until told by your health care provider. Stretching and range of motion exercises These exercises warm up your muscles and joints and improve the movement and flexibility of your hips and your back. These exercises also help to relieve pain, numbness, and tingling. Exercise A: Sciatic nerve glide 1. Sit in a chair with your head facing down toward your chest. Place your hands behind your back. Let your shoulders slump forward. 2. Slowly straighten one of your knees while you tilt your head back as if you are looking toward the ceiling. Only straighten your leg as far as you can without making your symptoms worse. 3. Hold for  __________ seconds. 4. Slowly return to the starting position. 5. Repeat with your other leg. Repeat __________ times. Complete this exercise __________ times a day. Exercise B: Knee to chest with hip adduction and internal rotation 1. Lie on your back on a firm surface with both legs straight. 2. Bend one of your knees and move it up toward your chest until you feel a gentle stretch in your lower back and buttock. Then, move your knee toward the shoulder that is on the opposite side from your leg.  Hold your leg in this position by holding onto the front of your knee. 3. Hold for __________ seconds. 4. Slowly return to the starting position. 5. Repeat with your other leg. Repeat __________ times. Complete this exercise __________ times a day. Exercise C: Prone extension on elbows 1. Lie on your abdomen on a firm surface. A bed may be too soft for this exercise. 2. Prop yourself up on your elbows. 3. Use your arms to help lift your chest up until you feel a gentle stretch in your abdomen and your lower back.  This will place some of your body weight on your elbows. If this is uncomfortable, try stacking pillows under your chest.  Your hips should stay down, against the surface that you are lying on. Keep your hip and back muscles relaxed. 4. Hold for __________ seconds. 5. Slowly relax your upper body and return to the starting position. Repeat __________ times. Complete this exercise __________ times a day. Strengthening exercises These exercises build strength and endurance in your back. Endurance is the ability to use your muscles for a long time, even after they get tired. Exercise D: Pelvic tilt 1. Lie on your back on a firm surface. Bend your knees and keep your feet flat. 2. Tense your abdominal muscles. Tip your pelvis up toward the ceiling and flatten your lower back into the floor.  To help with this exercise, you may place a small towel under your lower back and try to push  your back into the towel. 3. Hold for __________ seconds. 4. Let your muscles relax completely before you repeat this exercise. Repeat __________ times. Complete this exercise __________ times a day. Exercise E: Alternating arm and leg raises 1. Get on your hands and knees on a firm surface. If you are on a hard floor, you may want to use padding to cushion your knees, such as an exercise mat. 2. Line up your arms and legs. Your hands should be below your shoulders, and your knees should be below your hips. 3. Lift your left leg  behind you. At the same time, raise your right arm and straighten it in front of you.  Do not lift your leg higher than your hip.  Do not lift your arm higher than your shoulder.  Keep your abdominal and back muscles tight.  Keep your hips facing the ground.  Do not arch your back.  Keep your balance carefully, and do not hold your breath. 4. Hold for __________ seconds. 5. Slowly return to the starting position and repeat with your right leg and your left arm. Repeat __________ times. Complete this exercise __________ times a day. Posture and body mechanics   Body mechanics refers to the movements and positions of your body while you do your daily activities. Posture is part of body mechanics. Good posture and healthy body mechanics can help to relieve stress in your body's tissues and joints. Good posture means that your spine is in its natural S-curve position (your spine is neutral), your shoulders are pulled back slightly, and your head is not tipped forward. The following are general guidelines for applying improved posture and body mechanics to your everyday activities. Standing   When standing, keep your spine neutral and your feet about hip-width apart. Keep a slight bend in your knees. Your ears, shoulders, and hips should line up.  When you do a task in which you stand in one place for a long time, place one foot up on a stable object that is 2-4  inches (5-10 cm) high, such as a footstool. This helps keep your spine neutral. Sitting  When sitting, keep your spine neutral and keep your feet flat on the floor. Use a footrest, if necessary, and keep your thighs parallel to the floor. Avoid rounding your shoulders, and avoid tilting your head forward.  When working at a desk or a computer, keep your desk at a height where your hands are slightly lower than your elbows. Slide your chair under your desk so you are close enough to maintain good posture.  When working at a computer, place your monitor at a height where you are looking straight ahead and you do not have to tilt your head forward or downward to look at the screen. Resting   When lying down and resting, avoid positions that are most painful for you.  If you have pain with activities such as sitting, bending, stooping, or squatting (flexion-based activities), lie in a position in which your body does not bend very much. For example, avoid curling up on your side with your arms and knees near your chest (fetal position).  If you have pain with activities such as standing for a long time or reaching with your arms (extension-based activities), lie with your spine in a neutral position and bend your knees slightly. Try the following positions:  Lying on your side with a pillow between your knees.  Lying on your back with a pillow under your knees. Lifting   When lifting objects, keep your feet at least shoulder-width apart and tighten your abdominal muscles.  Bend your knees and hips and keep your spine neutral. It is important to lift using the strength of your legs, not your back. Do not lock your knees straight out.  Always ask for help to lift heavy or awkward objects. This information is not intended to replace advice given to you by your health care provider. Make sure you discuss any questions you have with your health care provider. Document Released: 01/03/2005  Document Revised: 09/10/2015 Document Reviewed:  09/19/2014 Elsevier Interactive Patient Education  2017 [employer].

## 2015-12-21 NOTE — Assessment & Plan Note (Addendum)
Start gabapentin 200 mg at night - will increase as tolerated Continue meloxicam Advised PT - deferred Exercises given Ice, heat Will keep out of work 4 days - return Friday Call/return if no improvement

## 2016-01-04 ENCOUNTER — Other Ambulatory Visit: Payer: Self-pay | Admitting: Internal Medicine

## 2016-01-21 ENCOUNTER — Ambulatory Visit (INDEPENDENT_AMBULATORY_CARE_PROVIDER_SITE_OTHER): Payer: Managed Care, Other (non HMO) | Admitting: Internal Medicine

## 2016-01-21 ENCOUNTER — Encounter: Payer: Self-pay | Admitting: Internal Medicine

## 2016-01-21 VITALS — BP 124/90 | HR 70 | Temp 98.4°F | Resp 16 | Wt 196.0 lb

## 2016-01-21 DIAGNOSIS — B9789 Other viral agents as the cause of diseases classified elsewhere: Secondary | ICD-10-CM | POA: Diagnosis not present

## 2016-01-21 DIAGNOSIS — J069 Acute upper respiratory infection, unspecified: Secondary | ICD-10-CM

## 2016-01-21 MED ORDER — BENZONATATE 200 MG PO CAPS: 200.0000 mg | ORAL_CAPSULE | Freq: Three times a day (TID) | ORAL | 0 refills | Status: DC | PRN

## 2016-01-21 MED ORDER — HYDROCODONE-HOMATROPINE 5-1.5 MG/5ML PO SYRP: 5.0000 mL | ORAL_SOLUTION | Freq: Four times a day (QID) | ORAL | 0 refills | Status: DC | PRN

## 2016-01-21 NOTE — Assessment & Plan Note (Signed)
Likely viral in nature Rest, fluids advil or tylenol Tessalon pills during day Hycodan at night - during day if not driving Follow up if no improvement

## 2016-01-21 NOTE — Progress Notes (Signed)
Subjective:    Patient ID: Mary [name], female    DOB: [birth year], 51 y.o.   MRN: [id number]  HPI She is here for an acute visit for cold symptoms.   Her symptoms started last week.  She had a subjective fever/chills, body pain, and dry cough.  She has had some mild wheeze.   She has had some mild ear pain, headaches. She initially had a sore throat and lightheadedness, but they have resolved.   She has not slept for the past three nights due to the cough.    She took dayquil and nyquil.      Medications and allergies reviewed with patient and updated if appropriate.  Patient Active Problem List   Diagnosis Date Noted  . Lumbar radiculopathy, acute 12/21/2015  . Postmenopausal 05/15/2015  . Hyperlipidemia 11/17/2014  . 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 Obesity 11/17/2014  . Intractable migraine without aura 04/26/2013  . Lumbar back pain 04/09/2013  . Knee pain, right 04/09/2013  . Constipation 07/08/2009  . HOT FLASHES 07/08/2009    Current Outpatient Prescriptions on File Prior to Visit  Medication Sig Dispense Refill  . Aspirin-Acetaminophen-Caffeine (EXCEDRIN MIGRAINE PO) Take by mouth as needed.    Marland Kitchen atorvastatin (LIPITOR) 10 MG tablet TAKE 1 TABLET (10 MG TOTAL) BY MOUTH DAILY. 90 tablet 3  . Calcium Carbonate-Vitamin D (CALCIUM 600+D3) 600-400 MG-UNIT per tablet Take 1 tablet by mouth daily.    . cyclobenzaprine (FLEXERIL) 10 MG tablet TAKE 1 TABLET BY MOUTH 3 TIMES A DAY AS NEEDED FOR SEVERE PAIN ONLY 90 tablet 3  . gabapentin (NEURONTIN) 100 MG capsule Take 2 capsules (200 mg total) by mouth at bedtime. 60 capsule 3  . meloxicam (MOBIC) 15 MG tablet TAKE 1 TABLET BY MOUTH EVERY DAY 30 tablet 3  . meloxicam (MOBIC) 15 MG tablet TAKE 1 TABLET BY MOUTH EVERY DAY 90 tablet 0  . Multiple Vitamin (MULTIVITAMIN) tablet Take 1 tablet by mouth daily.    . propranolol (INDERAL) 40 MG tablet Take 1 tablet (40 mg total) by mouth 2 (two) times daily. 180 tablet 3   No  current facility-administered medications on file prior to visit.     Past Medical History:  Diagnosis Date  . Headache(784.0)   . Hyperlipidemia     Past Surgical History:  Procedure Laterality Date  . CESAREAN SECTION      Social History   Social History  . Marital status: Married    Spouse name: [name]  . Number of children: 2  . Years of education: 12   Occupational History  .  [employer]   Social History Main Topics  . Smoking status: Never Smoker  . Smokeless tobacco: Never Used  . Alcohol use No  . Drug use: No  . Sexual activity: Yes   Other Topics Concern  . Not on file   Social History Narrative   Patient is right handed, resides with husband([name]),two children in a home.      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 Migraines Mother     Review of Systems  Constitutional: Positive for chills and fever.  HENT: Positive for ear pain (mild) and sore throat (resolved). Negative for congestion, sinus pain and sinus pressure.   Respiratory: Positive for cough and wheezing. Negative for chest tightness and shortness of breath.   Gastrointestinal: Negative for abdominal pain, diarrhea and nausea.  Musculoskeletal: Positive for  myalgias.  Neurological: Positive for light-headedness and headaches.       Objective:   Vitals:   01/21/16 1053  BP: 124/90  Pulse: 70  Resp: 16  Temp: 98.4 F (36.9 C)   Filed Weights   01/21/16 1053  Weight: 196 lb (88.9 kg)   Body mass index is 31.64 kg/m.  Wt Readings from Last 3 Encounters:  01/21/16 196 lb (88.9 kg)  12/21/15 188 lb (85.3 kg)  05/15/15 187 lb (84.8 kg)     Physical Exam GENERAL APPEARANCE: Appears stated age, well appearing, NAD EYES: conjunctiva clear, no icterus HEENT: bilateral tympanic membranes and ear canals normal, oropharynx with minimal erythema, no thyromegaly, trachea midline, no cervical or supraclavicular lymphadenopathy LUNGS: Clear  to auscultation without wheeze or crackles, unlabored breathing, good air entry bilaterally HEART: Normal S1,S2 without murmurs EXTREMITIES: Without clubbing, cyanosis, or edema        Assessment & Plan:   See Problem List for Assessment and Plan of chronic medical problems.

## 2016-01-21 NOTE — Patient Instructions (Signed)

## 2016-01-21 NOTE — Progress Notes (Signed)
Pre visit review using our clinic review tool, if applicable. No additional management support is needed unless otherwise documented below in the visit note. 

## 2016-04-02 ENCOUNTER — Other Ambulatory Visit: Payer: Self-pay | Admitting: Internal Medicine

## 2016-04-19 ENCOUNTER — Ambulatory Visit (INDEPENDENT_AMBULATORY_CARE_PROVIDER_SITE_OTHER): Payer: Managed Care, Other (non HMO) | Admitting: Nurse Practitioner

## 2016-04-19 ENCOUNTER — Encounter (INDEPENDENT_AMBULATORY_CARE_PROVIDER_SITE_OTHER): Payer: Self-pay

## 2016-04-19 ENCOUNTER — Encounter: Payer: Self-pay | Admitting: Nurse Practitioner

## 2016-04-19 DIAGNOSIS — R519 Headache, unspecified: Secondary | ICD-10-CM | POA: Insufficient documentation

## 2016-04-19 DIAGNOSIS — R51 Headache: Secondary | ICD-10-CM | POA: Diagnosis not present

## 2016-04-19 MED ORDER — PROPRANOLOL HCL 40 MG PO TABS: 40.0000 mg | ORAL_TABLET | Freq: Two times a day (BID) | ORAL | 3 refills | Status: DC

## 2016-04-19 NOTE — Progress Notes (Signed)
I have read the note, and I agree with the clinical assessment and plan.  Marton Malizia KEITH   

## 2016-04-19 NOTE — Progress Notes (Signed)
[facility]  PATIENT: Mary [name] DOB: Nov 15, 1965   REASON FOR VISIT: follow up for migraine headaches HISTORY FROM:patient alone at visit    HISTORY OF PRESENT ILLNESS:Mary [name], 51 year old female returns for yearly follow-up.  She has a history of migraine headaches which are  well controlled on propanolol 40 mg twice daily. Her headaches are rare at this point. She may have 1  to 2  headaches per month, less in intensity and severity. Her biggest trigger is not eating breakfast and  getting a headache around 11:00. She goes to [employer] 3  times a week for exercise She returns for reevaluation. She needs refills on her medications. She has had no new medical problems since last seen    HISTORY:of migraine headaches. She was on Topamax and felt the headaches were worse. She is currently on propranolol twice a day and doing well. Her last migraine was over a month ago Her trigger some times is not eating correctly and due to hypoglycemia. She was taking Midrin but that drug has been taken off the market. Ultram has been tried and she thought made her headaches were worse. She is now taking Cambia with good effect. She continues to exercise, she has occasional joint pain, low back pain and constipation. She has lost a few pounds since last seen. No new neurological complaints   REVIEW OF SYSTEMS: Full 14 system review of systems performed and notable only for those listed, all others are neg:  Constitutional: neg  Cardiovascular: neg Ear/Nose/Throat: neg  Skin: neg Eyes: neg Respiratory: neg Gastroitestinal: neg  Hematology/Lymphatic: neg  Endocrine: neg Musculoskeletal:neg Allergy/Immunology: neg Neurological: neg Psychiatric: neg Sleep : neg   ALLERGIES: Allergies  Allergen Reactions  . Seasonal Ic [Cholestatin]     Pt states no allergies to medications, unsure of reaction if any    HOME MEDICATIONS: Outpatient Medications Prior to Visit    Medication Sig Dispense Refill  . Aspirin-Acetaminophen-Caffeine (EXCEDRIN MIGRAINE PO) Take by mouth as needed.    Marland Kitchen atorvastatin (LIPITOR) 10 MG tablet TAKE 1 TABLET (10 MG TOTAL) BY MOUTH DAILY. 90 tablet 3  . Calcium Carbonate-Vitamin D (CALCIUM 600+D3) 600-400 MG-UNIT per tablet Take 1 tablet by mouth daily.    . cyclobenzaprine (FLEXERIL) 10 MG tablet TAKE 1 TABLET BY MOUTH 3 TIMES A DAY AS NEEDED FOR SEVERE PAIN ONLY 90 tablet 3  . gabapentin (NEURONTIN) 100 MG capsule Take 2 capsules (200 mg total) by mouth at bedtime. 60 capsule 3  . meloxicam (MOBIC) 15 MG tablet TAKE 1 TABLET BY MOUTH EVERY DAY 90 tablet 0  . Multiple Vitamin (MULTIVITAMIN) tablet Take 1 tablet by mouth daily.    . propranolol (INDERAL) 40 MG tablet Take 1 tablet (40 mg total) by mouth 2 (two) times daily. 180 tablet 3  . benzonatate (TESSALON) 200 MG capsule Take 1 capsule (200 mg total) by mouth 3 (three) times daily as needed for cough. 30 capsule 0  . HYDROcodone-homatropine (HYCODAN) 5-1.5 MG/5ML syrup Take 5 mLs by mouth every 6 (six) hours as needed for cough. This will cause drowsiness 120 mL 0  . meloxicam (MOBIC) 15 MG tablet TAKE 1 TABLET BY MOUTH EVERY DAY 30 tablet 3   No facility-administered medications prior to visit.     PAST MEDICAL HISTORY: Past Medical History:  Diagnosis Date  . Headache(784.0)   . Hyperlipidemia     PAST SURGICAL HISTORY: Past Surgical History:  Procedure Laterality Date  . CESAREAN SECTION  FAMILY HISTORY: Family History  Problem Relation Age of Onset  . Migraines Mother     SOCIAL HISTORY: Social History   Social History  . Marital status: Married    Spouse name: [name]  . Number of children: 2  . Years of education: 12   Occupational History  .  [employer]   Social History Main Topics  . Smoking status: Never Smoker  . Smokeless tobacco: Never Used  . Alcohol use No  . Drug use: No  . Sexual activity: Yes   Other Topics Concern  . Not on  file   Social History Narrative   Patient is right handed, resides with husband([name]),two children in a home.      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 Generalized: Well developed, obese female in no acute distress  Head: normocephalic and atraumatic,. Oropharynx benign  Neck: Supple Neurological examination   Mentation: Alert oriented to time, place, history taking. Attention span and concentration appropriate. Recent and remote memory intact. Follows all commands speech and language fluent.   Cranial nerve II-XII: Pupils were equal round reactive to light extraocular movements were full, visual field were full on confrontational test. Facial sensation and strength were normal. hearing was intact to finger rubbing bilaterally. Uvula tongue midline. head turning and shoulder shrug were normal and symmetric.Tongue protrusion into cheek strength was normal. Motor: normal bulk and tone, full strength in the BUE, BLE, fine finger movements normal, no pronator drift. No focal weakness Coordination: finger-nose-finger, heel-to-shin bilaterally, no dysmetria Sensory intact to touch and pinprick and vibratory in the upper and lower extremities Reflexes: Brachioradialis 2/2, biceps 2/2, triceps 2/2, patellar 2/2, Achilles 2/2, plantar responses were flexor bilaterally. Gait and Station: Rising up from seated position without assistance, normal stance, moderate stride, good arm swing, smooth turning, able to perform tiptoe, and heel walking without difficulty. Tandem gait is steady  DIAGNOSTIC DATA (LABS, IMAGING, TESTING) -    Component Value Date/Time   NA 135 05/15/2015 1138   K 3.8 05/15/2015 1138   CL 101 05/15/2015 1138   CO2 32 05/15/2015 1138   GLUCOSE 88 05/15/2015 1138   GLUCOSE 90 12/27/2005 1118   BUN 14 05/15/2015 1138   CREATININE 0.67  05/15/2015 1138   CALCIUM 9.4 05/15/2015 1138   PROT 7.5 05/15/2015 1138   ALBUMIN 3.8 05/15/2015 1138   AST 18 05/15/2015 1138   ALT 16 05/15/2015 1138   ALKPHOS 64 05/15/2015 1138   BILITOT 0.9 05/15/2015 1138   GFRNONAA 122.03 07/08/2009 0836   Lab Results  Component Value Date   CHOL 168 05/15/2015   HDL 73.80 05/15/2015   LDLCALC 84 05/15/2015   LDLDIRECT 127.9 12/05/2011   TRIG 51.0 05/15/2015   CHOLHDL 2 05/15/2015    Lab Results  Component Value Date   TSH 0.58 05/15/2015      ASSESSMENT AND PLAN  51 y.o. year old female  has a past medical history of Headache(784.0) and Hyperlipidemia. here to follow-up for her migraines. She has at most 1-2 per month well-controlled on propanolol.  Continue Propranol for migraine, will refill  Avoid migraine triggers, especially skipping meals F/U yearly and prn Call for increase in headache Migraine tracker APP to record headaches I spent  15 minutes in total face to face time with  the patient more than 50% of which was spent counseling and coordination of care, reviewing medications and discussing and reviewing the diagnosis of migraine and further treatment options. Importance of keeping a diary if headaches worsen to include the time of the headache what you're doing any other specific information that would be useful. Discussed stress relief techniques such as deep breathing muscle relaxation mental relaxation to music. Discussed importance of exercise, regular meals  and sleep. Sleep deprivation can be a migraine trigger [name], [facility], [facility], APRN  [facility] [address], [place],  [address] [phone]

## 2016-04-19 NOTE — Patient Instructions (Signed)
Continue Propranol for migraine, will refill  Avoid migraine triggers F/U yearly and prn Call for increase in headache Migraine tracker APP to record headaches

## 2016-04-20 ENCOUNTER — Other Ambulatory Visit: Payer: Self-pay | Admitting: Internal Medicine

## 2016-05-03 ENCOUNTER — Other Ambulatory Visit: Payer: Self-pay | Admitting: Internal Medicine

## 2016-05-03 NOTE — Telephone Encounter (Signed)
Please advise 

## 2016-05-30 ENCOUNTER — Other Ambulatory Visit: Payer: Self-pay | Admitting: Internal Medicine

## 2016-06-20 ENCOUNTER — Other Ambulatory Visit: Payer: Self-pay | Admitting: Internal Medicine

## 2016-07-04 ENCOUNTER — Other Ambulatory Visit: Payer: Self-pay | Admitting: Internal Medicine

## 2016-09-21 ENCOUNTER — Other Ambulatory Visit: Payer: Self-pay | Admitting: Internal Medicine

## 2016-10-01 ENCOUNTER — Other Ambulatory Visit: Payer: Self-pay | Admitting: Internal Medicine

## 2016-10-05 ENCOUNTER — Other Ambulatory Visit: Payer: Self-pay | Admitting: Internal Medicine

## 2016-10-12 NOTE — Progress Notes (Signed)
Subjective:    Patient ID: Mary [name], female    DOB: 02-Jul-1965, 51 y.o.   MRN: [id number]  HPI She is here for a physical exam.   She has no concerns.   She continues to have back pain and has continued to take the flexeril and meloxicam daily.   She stopped the gabapentin.    Medications and allergies reviewed with patient and updated if appropriate.  Patient Active Problem List   Diagnosis Date Noted  . Headache 04/19/2016  . Lumbar radiculopathy, acute 12/21/2015  . Postmenopausal 05/15/2015  . Hyperlipidemia 11/17/2014  . 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 Obesity 11/17/2014  . Intractable migraine without aura 04/26/2013  . Lumbar back pain 04/09/2013  . Knee pain, right 04/09/2013  . Constipation 07/08/2009  . HOT FLASHES 07/08/2009    Current Outpatient Prescriptions on File Prior to Visit  Medication Sig Dispense Refill  . Aspirin-Acetaminophen-Caffeine (EXCEDRIN MIGRAINE PO) Take by mouth as needed.    Marland Kitchen atorvastatin (LIPITOR) 10 MG tablet Take 1 tablet (10 mg total) by mouth daily. 30 tablet 0  . Calcium Carbonate-Vitamin D (CALCIUM 600+D3) 600-400 MG-UNIT per tablet Take 1 tablet by mouth daily.    . cyclobenzaprine (FLEXERIL) 10 MG tablet TAKE 1 TABLET BY MOUTH 3 TIMES A DAY AS NEEDED FOR SEVERE PAIN ONLY-- CPE needed for further refills. 90 tablet 0  . gabapentin (NEURONTIN) 100 MG capsule Take 2 capsules (200 mg total) by mouth at bedtime. 60 capsule 3  . meloxicam (MOBIC) 15 MG tablet Take 1 tablet (15 mg total) by mouth daily. 30 tablet 0  . Multiple Vitamin (MULTIVITAMIN) tablet Take 1 tablet by mouth daily.    . propranolol (INDERAL) 40 MG tablet Take 1 tablet (40 mg total) by mouth 2 (two) times daily. 180 tablet 3   No current facility-administered medications on file prior to visit.     Past Medical History:  Diagnosis Date  . Headache(784.0)   . Hyperlipidemia     Past Surgical History:  Procedure Laterality Date  . CESAREAN SECTION       Social History   Social History  . Marital status: Married    Spouse name: [name]  . Number of children: 2  . Years of education: 12   Occupational History  .  [employer]   Social History Main Topics  . Smoking status: Never Smoker  . Smokeless tobacco: Never Used  . Alcohol use No  . Drug use: No  . Sexual activity: Yes   Other Topics Concern  . None   Social History Narrative   Patient is right handed, resides with husband([name]),two children in a home.      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 Migraines Mother     Review of Systems  Constitutional: Negative for chills and fever.  Eyes: Negative for visual disturbance.  Respiratory: Positive for wheezing (rare). Negative for cough and shortness of breath.   Cardiovascular: Negative for chest pain, palpitations and leg swelling.  Gastrointestinal: Positive for constipation. Negative for abdominal pain, blood in stool, diarrhea and nausea.  Genitourinary: Negative for dysuria and hematuria.  Musculoskeletal: Positive for arthralgias (knee pain) and back pain.  Skin: Negative for color change and rash.  Neurological: Positive for dizziness (with headaches) and headaches. Negative for light-headedness.  Psychiatric/Behavioral: Negative for dysphoric mood. The patient is not nervous/anxious.        Objective:  Vitals:   10/13/16 0858  BP: 120/84  Pulse: 70  Resp: 16  Temp: 98.5 F (36.9 C)  SpO2: 97%   Filed Weights   10/13/16 0858  Weight: 199 lb (90.3 kg)   Body mass index is 32.12 kg/m.  Wt Readings from Last 3 Encounters:  10/13/16 199 lb (90.3 kg)  04/19/16 195 lb 9.6 oz (88.7 kg)  01/21/16 196 lb (88.9 kg)     Physical Exam Constitutional: She appears well-developed and well-nourished. No distress.  HENT:  Head: Normocephalic and atraumatic.  Right Ear: External ear normal. Normal ear canal and TM Left Ear: External ear normal.  Normal ear  canal and TM Mouth/Throat: Oropharynx is clear and moist.  Eyes: Conjunctivae and EOM are normal.  Neck: Neck supple. No tracheal deviation present. No thyromegaly present.  No carotid bruit  Cardiovascular: Normal rate, regular rhythm and normal heart sounds.   No murmur heard.  No edema. Pulmonary/Chest: Effort normal and breath sounds normal. No respiratory distress. She has no wheezes. She has no rales.  Breast: deferred to Gyn Abdominal: Soft. She exhibits no distension. There is no tenderness.  Lymphadenopathy: She has no cervical adenopathy.  Skin: Skin is warm and dry. She is not diaphoretic.  Psychiatric: She has a normal mood and affect. Her behavior is normal.         Assessment & Plan:   Physical exam: Screening blood work  ordered Immunizations  Td up to date, flu vaccine today Mammogram  Due - will schedule - information given Gyn   - due - info given to establish Eye exams   Up to date  Exercise  Zumba 3 /week Weight    Advised weight loss Skin no concerns Substance abuse  none  See Problem List for Assessment and Plan of chronic medical problems.   FU in one year, sooner if back pain persists

## 2016-10-12 NOTE — Patient Instructions (Addendum)
Schedule a mammogram -  [facility] [phone]   [facility] [address] [place], [place] Across the street from [facility] [phone]    Test(s) ordered today. Your results will be released to [place] (or called to you) after review, usually within 72hours after test completion. If any changes need to be made, you will be notified at that same time.  All other Health Maintenance issues reviewed.   All recommended immunizations and age-appropriate screenings are up-to-date or discussed.  Flu immunization administered today.   Medications reviewed and updated.  No changes recommended at this time.  Your prescription(s) have been submitted to your pharmacy. Please take as directed and contact our office if you believe you are having problem(s) with the medication(s).   Please followup in one year   Health Maintenance, Female Adopting a healthy lifestyle and getting preventive care can go a long way to promote health and wellness. Talk with your health care provider about what schedule of regular examinations is right for you. This is a good chance for you to check in with your provider about disease prevention and staying healthy. In between checkups, there are plenty of things you can do on your own. Experts have done a lot of research about which lifestyle changes and preventive measures are most likely to keep you healthy. Ask your health care provider for more information. Weight and diet Eat a healthy diet  Be sure to include plenty of vegetables, fruits, low-fat dairy products, and lean protein.  Do not eat a lot of foods high in solid fats, added sugars, or salt.  Get regular exercise. This is one of the most important things you can do for your health. ? Most adults should exercise for at least 150 minutes each week. The exercise  should increase your heart rate and make you sweat (moderate-intensity exercise). ? Most adults should also do strengthening exercises at least twice a week. This is in addition to the moderate-intensity exercise.  Maintain a healthy weight  Body mass index (BMI) is a measurement that can be used to identify possible weight problems. It estimates body fat based on height and weight. Your health care provider can help determine your BMI and help you achieve or maintain a healthy weight.  For females 18 years of age and older: ? A BMI below 18.5 is considered underweight. ? A BMI of 18.5 to 24.9 is normal. ? A BMI of 25 to 29.9 is considered overweight. ? A BMI of 30 and above is considered obese.  Watch levels of cholesterol and blood lipids  You should start having your blood tested for lipids and cholesterol at 49 years of age, then have this test every 5 years.  You may need to have your cholesterol levels checked more often if: ? Your lipid or cholesterol levels are high. ? You are older than 49 years of age. ? You are at high risk for heart disease.  Cancer screening Lung Cancer  Lung cancer screening is recommended for adults 18-71 years old who are at high risk for lung cancer because of a history of smoking.  A yearly low-dose CT scan of the lungs is recommended for people who: ? Currently smoke. ? Have quit within the past 15 years. ? Have at least a 30-pack-year history of smoking. A pack year is smoking an average of one pack of cigarettes a day  for 1 year.  Yearly screening should continue until it has been 15 years since you quit.  Yearly screening should stop if you develop a health problem that would prevent you from having lung cancer treatment.  Breast Cancer  Practice breast self-awareness. This means understanding how your breasts normally appear and feel.  It also means doing regular breast self-exams. Let your health care provider know about any changes, no  matter how small.  If you are in your 20s or 30s, you should have a clinical breast exam (CBE) by a health care provider every 1-3 years as part of a regular health exam.  If you are 47 or older, have a CBE every year. Also consider having a breast X-ray (mammogram) every year.  If you have a family history of breast cancer, talk to your health care provider about genetic screening.  If you are at high risk for breast cancer, talk to your health care provider about having an MRI and a mammogram every year.  Breast cancer gene (BRCA) assessment is recommended for women who have family members with BRCA-related cancers. BRCA-related cancers include: ? Breast. ? Ovarian. ? Tubal. ? Peritoneal cancers.  Results of the assessment will determine the need for genetic counseling and BRCA1 and BRCA2 testing.  Cervical Cancer Your health care provider may recommend that you be screened regularly for cancer of the pelvic organs (ovaries, uterus, and vagina). This screening involves a pelvic examination, including checking for microscopic changes to the surface of your cervix (Pap test). You may be encouraged to have this screening done every 3 years, beginning at age 23.  For women ages 33-65, health care providers may recommend pelvic exams and Pap testing every 3 years, or they may recommend the Pap and pelvic exam, combined with testing for human papilloma virus (HPV), every 5 years. Some types of HPV increase your risk of cervical cancer. Testing for HPV may also be done on women of any age with unclear Pap test results.  Other health care providers may not recommend any screening for nonpregnant women who are considered low risk for pelvic cancer and who do not have symptoms. Ask your health care provider if a screening pelvic exam is right for you.  If you have had past treatment for cervical cancer or a condition that could lead to cancer, you need Pap tests and screening for cancer for at least  20 years after your treatment. If Pap tests have been discontinued, your risk factors (such as having a new sexual partner) need to be reassessed to determine if screening should resume. Some women have medical problems that increase the chance of getting cervical cancer. In these cases, your health care provider may recommend more frequent screening and Pap tests.  Colorectal Cancer  This type of cancer can be detected and often prevented.  Routine colorectal cancer screening usually begins at 49 years of age and continues through 49 years of age.  Your health care provider may recommend screening at an earlier age if you have risk factors for colon cancer.  Your health care provider may also recommend using home test kits to check for hidden blood in the stool.  A small camera at the end of a tube can be used to examine your colon directly (sigmoidoscopy or colonoscopy). This is done to check for the earliest forms of colorectal cancer.  Routine screening usually begins at age 86.  Direct examination of the colon should be repeated every 5-10 years through  49 years of age. However, you may need to be screened more often if early forms of precancerous polyps or small growths are found.  Skin Cancer  Check your skin from head to toe regularly.  Tell your health care provider about any new moles or changes in moles, especially if there is a change in a mole's shape or color.  Also tell your health care provider if you have a mole that is larger than the size of a pencil eraser.  Always use sunscreen. Apply sunscreen liberally and repeatedly throughout the day.  Protect yourself by wearing long sleeves, pants, a wide-brimmed hat, and sunglasses whenever you are outside.  Heart disease, diabetes, and high blood pressure  High blood pressure causes heart disease and increases the risk of stroke. High blood pressure is more likely to develop in: ? People who have blood pressure in the  high end of the normal range (130-139/85-89 mm Hg). ? People who are overweight or obese. ? People who are African American.  If you are 65-48 years of age, have your blood pressure checked every 3-5 years. If you are 84 years of age or older, have your blood pressure checked every year. You should have your blood pressure measured twice-once when you are at a hospital or clinic, and once when you are not at a hospital or clinic. Record the average of the two measurements. To check your blood pressure when you are not at a hospital or clinic, you can use: ? An automated blood pressure machine at a pharmacy. ? A home blood pressure monitor.  If you are between 74 years and 75 years old, ask your health care provider if you should take aspirin to prevent strokes.  Have regular diabetes screenings. This involves taking a blood sample to check your fasting blood sugar level. ? If you are at a normal weight and have a low risk for diabetes, have this test once every three years after 50 years of age. ? If you are overweight and have a high risk for diabetes, consider being tested at a younger age or more often. Preventing infection Hepatitis B  If you have a higher risk for hepatitis B, you should be screened for this virus. You are considered at high risk for hepatitis B if: ? You were born in a country where hepatitis B is common. Ask your health care provider which countries are considered high risk. ? Your parents were born in a high-risk country, and you have not been immunized against hepatitis B (hepatitis B vaccine). ? You have HIV or AIDS. ? You use needles to inject street drugs. ? You live with someone who has hepatitis B. ? You have had sex with someone who has hepatitis B. ? You get hemodialysis treatment. ? You take certain medicines for conditions, including cancer, organ transplantation, and autoimmune conditions.  Hepatitis C  Blood testing is recommended for: ? Everyone born  from [birth year] through 1963. ? Anyone with known risk factors for hepatitis C.  Sexually transmitted infections (STIs)  You should be screened for sexually transmitted infections (STIs) including gonorrhea and chlamydia if: ? You are sexually active and are younger than 49 years of age. ? You are older than 49 years of age and your health care provider tells you that you are at risk for this type of infection. ? Your sexual activity has changed since you were last screened and you are at an increased risk for chlamydia or gonorrhea. Ask your  health care provider if you are at risk.  If you do not have HIV, but are at risk, it may be recommended that you take a prescription medicine daily to prevent HIV infection. This is called pre-exposure prophylaxis (PrEP). You are considered at risk if: ? You are sexually active and do not regularly use condoms or know the HIV status of your partner(s). ? You take drugs by injection. ? You are sexually active with a partner who has HIV.  Talk with your health care provider about whether you are at high risk of being infected with HIV. If you choose to begin PrEP, you should first be tested for HIV. You should then be tested every 3 months for as long as you are taking PrEP. Pregnancy  If you are premenopausal and you may become pregnant, ask your health care provider about preconception counseling.  If you may become pregnant, take 400 to 800 micrograms (mcg) of folic acid every day.  If you want to prevent pregnancy, talk to your health care provider about birth control (contraception). Osteoporosis and menopause  Osteoporosis is a disease in which the bones lose minerals and strength with aging. This can result in serious bone fractures. Your risk for osteoporosis can be identified using a bone density scan.  If you are 91 years of age or older, or if you are at risk for osteoporosis and fractures, ask your health care provider if you should be  screened.  Ask your health care provider whether you should take a calcium or vitamin D supplement to lower your risk for osteoporosis.  Menopause may have certain physical symptoms and risks.  Hormone replacement therapy may reduce some of these symptoms and risks. Talk to your health care provider about whether hormone replacement therapy is right for you. Follow these instructions at home:  Schedule regular health, dental, and eye exams.  Stay current with your immunizations.  Do not use any tobacco products including cigarettes, chewing tobacco, or electronic cigarettes.  If you are pregnant, do not drink alcohol.  If you are breastfeeding, limit how much and how often you drink alcohol.  Limit alcohol intake to no more than 1 drink per day for nonpregnant women. One drink equals 12 ounces of beer, 5 ounces of wine, or 1 ounces of hard liquor.  Do not use street drugs.  Do not share needles.  Ask your health care provider for help if you need support or information about quitting drugs.  Tell your health care provider if you often feel depressed.  Tell your health care provider if you have ever been abused or do not feel safe at home. This information is not intended to replace advice given to you by your health care provider. Make sure you discuss any questions you have with your health care provider. Document Released: 07/19/2010 Document Revised: 06/11/2015 Document Reviewed: 10/07/2014 Elsevier Interactive Patient Education  [employer].

## 2016-10-13 ENCOUNTER — Encounter: Payer: Self-pay | Admitting: Internal Medicine

## 2016-10-13 ENCOUNTER — Ambulatory Visit (INDEPENDENT_AMBULATORY_CARE_PROVIDER_SITE_OTHER): Payer: Managed Care, Other (non HMO) | Admitting: Internal Medicine

## 2016-10-13 ENCOUNTER — Other Ambulatory Visit (INDEPENDENT_AMBULATORY_CARE_PROVIDER_SITE_OTHER): Payer: Managed Care, Other (non HMO)

## 2016-10-13 VITALS — BP 120/84 | HR 70 | Temp 98.5°F | Resp 16 | Ht 66.0 in | Wt 199.0 lb

## 2016-10-13 DIAGNOSIS — E785 Hyperlipidemia, unspecified: Secondary | ICD-10-CM

## 2016-10-13 DIAGNOSIS — Z0000 Encounter for general adult medical examination without abnormal findings: Secondary | ICD-10-CM

## 2016-10-13 DIAGNOSIS — M545 Low back pain, unspecified: Secondary | ICD-10-CM

## 2016-10-13 DIAGNOSIS — G8929 Other chronic pain: Secondary | ICD-10-CM

## 2016-10-13 DIAGNOSIS — E6609 Other obesity due to excess calories: Secondary | ICD-10-CM

## 2016-10-13 DIAGNOSIS — Z23 Encounter for immunization: Secondary | ICD-10-CM | POA: Diagnosis not present

## 2016-10-13 DIAGNOSIS — G43019 Migraine without aura, intractable, without status migrainosus: Secondary | ICD-10-CM | POA: Diagnosis not present

## 2016-10-13 LAB — LIPID PANEL
Cholesterol: 179 mg/dL (ref 0–200)
HDL: 81.2 mg/dL (ref >39.00)
LDL CALC: 90 mg/dL (ref 0–99)
NONHDL: 97.47
Total CHOL/HDL Ratio: 2
Triglycerides: 39 mg/dL (ref 0.0–149.0)
VLDL: 7.8 mg/dL (ref 0.0–40.0)

## 2016-10-13 LAB — COMPREHENSIVE METABOLIC PANEL
ALK PHOS: 73 U/L (ref 39–117)
ALT: 19 U/L (ref 0–35)
AST: 17 U/L (ref 0–37)
Albumin: 3.8 g/dL (ref 3.5–5.2)
BUN: 14 mg/dL (ref 6–23)
CHLORIDE: 101 meq/L (ref 96–112)
CO2: 31 mEq/L (ref 19–32)
Calcium: 9.5 mg/dL (ref 8.4–10.5)
Creatinine, Ser: 0.72 mg/dL (ref 0.40–1.20)
GFR: 110.6 mL/min (ref >60.00)
GLUCOSE: 92 mg/dL (ref 70–99)
POTASSIUM: 4.1 meq/L (ref 3.5–5.1)
SODIUM: 137 meq/L (ref 135–145)
Total Bilirubin: 1.1 mg/dL (ref 0.2–1.2)
Total Protein: 7.5 g/dL (ref 6.0–8.3)

## 2016-10-13 LAB — CBC WITH DIFFERENTIAL/PLATELET
BASOS PCT: 0.8 % (ref 0.0–3.0)
Basophils Absolute: 0 10*3/uL (ref 0.0–0.1)
EOS ABS: 0.2 10*3/uL (ref 0.0–0.7)
EOS PCT: 3.6 % (ref 0.0–5.0)
HCT: 37.7 % (ref 36.0–46.0)
HEMOGLOBIN: 12.4 g/dL (ref 12.0–15.0)
Lymphocytes Relative: 39.4 % (ref 12.0–46.0)
Lymphs Abs: 1.8 10*3/uL (ref 0.7–4.0)
MCHC: 33 g/dL (ref 30.0–36.0)
MCV: 93.8 fl (ref 78.0–100.0)
MONOS PCT: 8.5 % (ref 3.0–12.0)
Monocytes Absolute: 0.4 10*3/uL (ref 0.1–1.0)
Neutro Abs: 2.2 10*3/uL (ref 1.4–7.7)
Neutrophils Relative %: 47.7 % (ref 43.0–77.0)
Platelets: 181 10*3/uL (ref 150.0–400.0)
RBC: 4.02 Mil/uL (ref 3.87–5.11)
RDW: 14.5 % (ref 11.5–15.5)
WBC: 4.5 10*3/uL (ref 4.0–10.5)

## 2016-10-13 LAB — TSH: TSH: 0.55 u[IU]/mL (ref 0.35–4.50)

## 2016-10-13 MED ORDER — MELOXICAM 15 MG PO TABS: 15.0000 mg | ORAL_TABLET | Freq: Every day | ORAL | 5 refills | Status: DC | PRN

## 2016-10-13 MED ORDER — CYCLOBENZAPRINE HCL 10 MG PO TABS: 5.0000 mg | ORAL_TABLET | Freq: Every evening | ORAL | 1 refills | Status: DC | PRN

## 2016-10-13 MED ORDER — ATORVASTATIN CALCIUM 10 MG PO TABS: 10.0000 mg | ORAL_TABLET | Freq: Every day | ORAL | 11 refills | Status: DC

## 2016-10-13 NOTE — Assessment & Plan Note (Signed)
Check lipid panel  Continue daily statin Regular exercise and healthy diet encouraged  

## 2016-10-13 NOTE — Assessment & Plan Note (Signed)
Takes propranolol once a day most day - increases it twice day if she starts to have increased headaches Uses Excedrin as needed Overall controlled Continue above

## 2016-10-13 NOTE — Assessment & Plan Note (Signed)
Was having radiculopathy, but that resolved -no longer taking gabapentin Taking mobic and flexeril daily - advised her to take only as needed - discussed that it is not good to take them daily or long term Discussed exercise, weight loss She has back exercises she can do If back pain persists advised seeing sports medicine

## 2016-10-13 NOTE — Assessment & Plan Note (Signed)
Continue regular exercise Decrease portions, healthy diet Advised weight loss

## 2016-10-27 LAB — HM PAP SMEAR: HM Pap smear: NEGATIVE

## 2016-10-28 ENCOUNTER — Telehealth: Payer: Self-pay | Admitting: *Deleted

## 2016-10-28 NOTE — Telephone Encounter (Signed)
Received request for Medical Records from [place]; forwarded to [place] for email/scan/SLS 10/12

## 2017-02-20 NOTE — Progress Notes (Signed)
Subjective:    Patient ID: Mary [name], female    DOB: 1965-11-14, 52 y.o.   MRN: [id number]  HPI The patient is here for an acute visit.  Back pain:  Five days ago she was sleeping and got up to go to the bathroom and had instant lower back pain.  The pain was across the lower back. It did not radiate.  Since it started she has taken the flexeril at night and meloxicam. She takes those medications as needed for intermittent lower back pain.  The pain will go away with the medication, but will come back.  The pain has improved, but is still moderate.  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    She has intermittent lower back pain.  It does not occur often.  The medication usually helps.  She has had episodes of sciatica in the past.  She was on gabapentin in the past and wondered if that would help now.   Medications and allergies reviewed with patient and updated if appropriate.  Patient Active Problem List   Diagnosis Date Noted  . Chronic lower back pain 12/21/2015  . Postmenopausal 05/15/2015  . Hyperlipidemia 11/17/2014  . 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 Obesity 11/17/2014  . Intractable migraine without aura 04/26/2013  . Knee pain, right 04/09/2013  . Constipation 07/08/2009    Current Outpatient Medications on File Prior to Visit  Medication Sig Dispense Refill  . Aspirin-Acetaminophen-Caffeine (EXCEDRIN MIGRAINE PO) Take by mouth as needed.    Marland Kitchen atorvastatin (LIPITOR) 10 MG tablet Take 1 tablet (10 mg total) by mouth daily. 30 tablet 11  . Calcium Carbonate-Vitamin D (CALCIUM 600+D3) 600-400 MG-UNIT per tablet Take 1 tablet by mouth daily.    . cyclobenzaprine (FLEXERIL) 10 MG tablet Take 0.5-1 tablets (5-10 mg total) by mouth at bedtime as needed for muscle spasms. 30 tablet 1  . meloxicam (MOBIC) 15 MG tablet Take 1 tablet (15 mg total) by mouth daily as needed for pain (Back pain). 30 tablet 5  . Multiple Vitamin (MULTIVITAMIN) tablet  Take 1 tablet by mouth daily.    . propranolol (INDERAL) 40 MG tablet Take 1 tablet (40 mg total) by mouth 2 (two) times daily. 180 tablet 3   No current facility-administered medications on file prior to visit.     Past Medical History:  Diagnosis Date  . Headache(784.0)   . Hyperlipidemia     Past Surgical History:  Procedure Laterality Date  . CESAREAN SECTION      Social History   Socioeconomic History  . Marital status: Married    Spouse name: [name]  . Number of children: 2  . Years of education: 62  . Highest education level: None  Social Needs  . Financial resource strain: None  . Food insecurity - worry: None  . Food insecurity - inability: None  . Transportation needs - medical: None  . Transportation needs - non-medical: None  Occupational History    Employer: [employer]  Tobacco Use  . Smoking status: Never Smoker  . Smokeless tobacco: Never Used  Substance and Sexual Activity  . Alcohol use: No  . Drug use: No  . Sexual activity: Yes  Other Topics Concern  . None  Social History Narrative   Patient is right handed, resides with husband([name]),two children in a home.      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 Migraines Mother     Review of Systems  Constitutional: Negative for chills and fever.  Gastrointestinal:       No change in bowels  Genitourinary:       No change in urination/incontinence  Musculoskeletal: Positive for back pain and myalgias.  Neurological: Negative for weakness and numbness.       Objective:   Vitals:   02/21/17 0826  BP: 110/82  Pulse: (!) 57  Resp: 16  Temp: 97.9 F (36.6 C)  SpO2: 96%   Wt Readings from Last 3 Encounters:  02/21/17 190 lb (86.2 kg)  10/13/16 199 lb (90.3 kg)  04/19/16 195 lb 9.6 oz (88.7 kg)   Body mass index is 30.67 kg/m.   Physical Exam  Constitutional: She appears well-developed and well-nourished. No distress.  HENT:  Head:  Normocephalic and atraumatic.  Musculoskeletal: She exhibits no edema.  No lumbar spine deformity Minimal tenderness across lower back  Neurological:  Normal strength and sensation in b/l LE, gait normal  Skin: Skin is warm and dry. She is not diaphoretic.           Assessment & Plan:    See Problem List for Assessment and Plan of chronic medical problems.

## 2017-02-21 ENCOUNTER — Ambulatory Visit (INDEPENDENT_AMBULATORY_CARE_PROVIDER_SITE_OTHER)
Admission: RE | Admit: 2017-02-21 | Discharge: 2017-02-21 | Disposition: A | Discharge status: Home / Self Care | Payer: Managed Care, Other (non HMO) | Source: Ambulatory Visit | Attending: Internal Medicine | Admitting: Internal Medicine

## 2017-02-21 ENCOUNTER — Encounter: Payer: Self-pay | Admitting: Internal Medicine

## 2017-02-21 ENCOUNTER — Ambulatory Visit: Payer: Managed Care, Other (non HMO) | Admitting: Internal Medicine

## 2017-02-21 VITALS — BP 110/82 | HR 57 | Temp 97.9°F | Resp 16 | Wt 190.0 lb

## 2017-02-21 DIAGNOSIS — M545 Low back pain, unspecified: Secondary | ICD-10-CM

## 2017-02-21 DIAGNOSIS — G8929 Other chronic pain: Secondary | ICD-10-CM

## 2017-02-21 MED ORDER — CYCLOBENZAPRINE HCL 10 MG PO TABS: 5.0000 mg | ORAL_TABLET | Freq: Every evening | ORAL | 1 refills | Status: DC | PRN

## 2017-02-21 MED ORDER — GABAPENTIN 100 MG PO CAPS: 200.0000 mg | ORAL_CAPSULE | Freq: Every day | ORAL | 3 refills | Status: DC

## 2017-02-21 NOTE — Assessment & Plan Note (Signed)
Has never had an xray - will order one today No radiation today, but has had sciatica in the past Continue flexeril at bedtime prn Continue meloxicam with food in morning - advised it is not ideal to take long term - stop once pain is better - use prn Restart gabapentin - if it does not help can d/c Will refer to sports medicine

## 2017-02-21 NOTE — Patient Instructions (Addendum)
   Medications reviewed and updated.  Changes include start gabapentin at night.  Start taking meloxicam with food in the morning.  Take the muscle relaxer at night as needed.     Your prescription(s) have been submitted to your pharmacy. Please take as directed and contact our office if you believe you are having problem(s) with the medication(s).    Make an appointment with sports medicine, Dr Tamala Julian, for further evaluation of your back pain.

## 2017-02-23 ENCOUNTER — Other Ambulatory Visit: Payer: Self-pay | Admitting: Emergency Medicine

## 2017-02-23 ENCOUNTER — Ambulatory Visit: Payer: Managed Care, Other (non HMO) | Admitting: Family Medicine

## 2017-02-23 MED ORDER — GABAPENTIN 100 MG PO CAPS: 200.0000 mg | ORAL_CAPSULE | Freq: Every day | ORAL | 0 refills | Status: DC

## 2017-02-27 ENCOUNTER — Other Ambulatory Visit: Payer: Self-pay | Admitting: Emergency Medicine

## 2017-02-27 MED ORDER — MELOXICAM 15 MG PO TABS: 15.0000 mg | ORAL_TABLET | Freq: Every day | ORAL | 0 refills | Status: DC | PRN

## 2017-03-02 ENCOUNTER — Ambulatory Visit: Payer: Managed Care, Other (non HMO) | Admitting: Family Medicine

## 2017-03-02 ENCOUNTER — Encounter: Payer: Self-pay | Admitting: Family Medicine

## 2017-03-02 DIAGNOSIS — G8929 Other chronic pain: Secondary | ICD-10-CM | POA: Diagnosis not present

## 2017-03-02 DIAGNOSIS — M545 Low back pain: Secondary | ICD-10-CM | POA: Diagnosis not present

## 2017-03-02 NOTE — Progress Notes (Signed)
Mary [name] - 52 y.o. female MRN [id number]  Date of birth: January 27, 1965  SUBJECTIVE:  Including CC & ROS.  Chief Complaint  Patient presents with  . Back Pain    Mary [name] is a 52 y.o. female that is here for an evaluation for lower back pain. Ongoing for years. Pain is chronic in nature.Pain is described as an ache. Pain is worse when she is laying down and sitting.Located near her coccyx. Denies injury or prior surgeries. She has been taking Mobic and Flexeril with some improvement. She has been applying heat. Denies trauma. Denies tingling or numbness in her legs. She works as a Secretary/administrator and her symptoms are exacerbated with bending over or rising from seated position. Denies any radicular symptoms.  Independent review of the lumbar spine from 2/5 shows no significant structural changes.   Review of Systems  Constitutional: Negative for fever.  Respiratory: Negative for cough.   Cardiovascular: Negative for chest pain.  Gastrointestinal: Negative for abdominal pain.  Musculoskeletal: Positive for back pain.  Skin: Negative for color change.  Hematological: Negative for adenopathy.  Psychiatric/Behavioral: Negative for agitation.    HISTORY: Past Medical, Surgical, Social, and Family History Reviewed & Updated per EMR.   Pertinent Historical Findings include:  Past Medical History:  Diagnosis Date  . Headache(784.0)   . Hyperlipidemia     Past Surgical History:  Procedure Laterality Date  . CESAREAN SECTION      Allergies  Allergen Reactions  . Seasonal Ic [Cholestatin]     Pt states no allergies to medications, unsure of reaction if any    Family History  Problem Relation Age of Onset  . Migraines Mother      Social History   Socioeconomic History  . Marital status: Married    Spouse name: [name]  . Number of children: 2  . Years of education: 31  . Highest education level: Not on file  Social Needs  . Financial resource strain: Not on  file  . Food insecurity - worry: Not on file  . Food insecurity - inability: Not on file  . Transportation needs - medical: Not on file  . Transportation needs - non-medical: Not on file  Occupational History    Employer: [employer]  Tobacco Use  . Smoking status: Never Smoker  . Smokeless tobacco: Never Used  Substance and Sexual Activity  . Alcohol use: No  . Drug use: No  . Sexual activity: Yes  Other Topics Concern  . Not on file  Social History Narrative   Patient is right handed, resides with husband([name]),two children in a home.      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     PHYSICAL EXAM:  VS: BP 128/74 (BP Location: Left Arm, Patient Position: Sitting, Cuff Size: Normal)   Pulse (!) 56   Temp 98.2 F (36.8 C) (Oral)   Ht 5\' 6"  (1.676 m)   Wt 190 lb (86.2 kg)   SpO2 100%   BMI 30.67 kg/m  Physical Exam Gen: NAD, alert, cooperative with exam, well-appearing ENT: normal lips, normal nasal mucosa,  Eye: normal EOM, normal conjunctiva and lids CV:  no edema, +2 pedal pulses   Resp: no accessory muscle use, non-labored,  Skin: no rashes, no areas of induration  Neuro: normal tone, normal sensation to touch Psych:  normal insight, alert and oriented MSK:  Back Exam:  Inspection: Unremarkable  Palpable tenderness: Some tenderness to palpation of the paraspinal muscles in the lumbar  spine. No tenderness to palpation of the lumbar midline spine. No tenderness to palpation of the SI joints, or greater trochanter Normal internal and external rotation of the hips Leg strength: Quad: 5/5 Hamstring: 5/5 Hip flexor: 5/5  Strength at foot: Plantar-flexion: 5/5 Dorsi-flexion: 5/5 Eversion: 5/5 Inversion: 5/5  Sensory change: Gross sensation intact to all lumbar and sacral dermatomes.  Reflexes: 2+ at both patellar tendons,  Gait unremarkable. SLR laying: Negative  XSLR laying: Negative  Neurovascularly intact       ASSESSMENT & PLAN:   Chronic lower back pain Pain  appears to be muscular in nature. No suggestion of nerve impingement today and x-rays are reassuring - Counseled on home exercise therapy - Can continue the anti-inflammatory muscle relaxer as needed or during an acute flare - Counseled on heat and other supportive measures - If no improvement will consider physical therapy

## 2017-03-02 NOTE — Assessment & Plan Note (Signed)
Pain appears to be muscular in nature. No suggestion of nerve impingement today and x-rays are reassuring - Counseled on home exercise therapy - Can continue the anti-inflammatory muscle relaxer as needed or during an acute flare - Counseled on heat and other supportive measures - If no improvement will consider physical therapy

## 2017-03-02 NOTE — Patient Instructions (Signed)
Please try working on the exercises if your pain is less than a 2 out of 10. Doing things like yoga and Pilates can help of low back pain as well. You can try Aspercreme with lidocaine arrival to the area. You can also try heating this area. He can take the anti-inflammatories and muscle relaxer on an as-needed basis if it flares up. If symptoms of back pain is not improved in 4-6 weeks and please follow-up with me at that time.

## 2017-04-24 ENCOUNTER — Ambulatory Visit: Payer: Managed Care, Other (non HMO) | Admitting: Nurse Practitioner

## 2017-06-03 ENCOUNTER — Other Ambulatory Visit: Payer: Self-pay | Admitting: Internal Medicine

## 2017-06-06 NOTE — Progress Notes (Signed)
[facility]  PATIENT: Mary [name] DOB: 15-Aug-1965   REASON FOR VISIT: follow up for migraine headaches HISTORY FROM:patient alone at visit    HISTORY OF PRESENT ILLNESS:Mary [name], 52 year old female returns for yearly follow-up.  She has a history of migraine headaches which are  well controlled on propanolol 40 mg twice daily. Her headaches are rare at this point. She may have 1  headache per month, less in intensity and severity. Her biggest trigger is not eating breakfast and  getting a headache around 11:00. She goes to [employer] 3  times a week for exercise She returns for reevaluation. She needs refills on her medications. She has had no new medical problems since last seen    HISTORY:of migraine headaches. She was on Topamax and felt the headaches were worse. She is currently on propranolol twice a day and doing well. Her last migraine was over a month ago Her trigger some times is not eating correctly and due to hypoglycemia. She was taking Midrin but that drug has been taken off the market. Ultram has been tried and she thought made her headaches were worse. She is now taking Cambia with good effect. She continues to exercise, she has occasional joint pain, low back pain and constipation. She has lost a few pounds since last seen. No new neurological complaints   REVIEW OF SYSTEMS: Full 14 system review of systems performed and notable only for those listed, all others are neg:  Constitutional: neg  Cardiovascular: neg Ear/Nose/Throat: neg  Skin: neg Eyes: neg Respiratory: neg Gastroitestinal: neg  Hematology/Lymphatic: neg  Endocrine: neg Musculoskeletal:neg Allergy/Immunology: neg Neurological: neg Psychiatric: neg Sleep : neg   ALLERGIES: Allergies  Allergen Reactions  . Seasonal Ic [Cholestatin]     Pt states no allergies to medications, unsure of reaction if any    HOME MEDICATIONS: Outpatient Medications Prior to Visit    Medication Sig Dispense Refill  . Aspirin-Acetaminophen-Caffeine (EXCEDRIN MIGRAINE PO) Take by mouth as needed.    Marland Kitchen atorvastatin (LIPITOR) 10 MG tablet Take 1 tablet (10 mg total) by mouth daily. 30 tablet 11  . Calcium Carbonate-Vitamin D (CALCIUM 600+D3) 600-400 MG-UNIT per tablet Take 1 tablet by mouth daily.    . cyclobenzaprine (FLEXERIL) 10 MG tablet Take 0.5-1 tablets (5-10 mg total) by mouth at bedtime as needed for muscle spasms. 30 tablet 1  . meloxicam (MOBIC) 15 MG tablet TAKE 1 TABLET (15 MG TOTAL) BY MOUTH DAILY AS NEEDED FOR PAIN (BACK PAIN). 90 tablet 0  . Multiple Vitamin (MULTIVITAMIN) tablet Take 1 tablet by mouth daily.    . propranolol (INDERAL) 40 MG tablet Take 1 tablet (40 mg total) by mouth 2 (two) times daily. 180 tablet 3  . gabapentin (NEURONTIN) 100 MG capsule Take 2 capsules (200 mg total) by mouth at bedtime. (Patient not taking: Reported on 06/07/2017) 180 capsule 0   No facility-administered medications prior to visit.     PAST MEDICAL HISTORY: Past Medical History:  Diagnosis Date  . Headache(784.0)   . Hyperlipidemia     PAST SURGICAL HISTORY: Past Surgical History:  Procedure Laterality Date  . CESAREAN SECTION      FAMILY HISTORY: Family History  Problem Relation Age of Onset  . Migraines Mother     SOCIAL HISTORY: Social History   Socioeconomic History  . Marital status: Married    Spouse name: [name]  . Number of children: 2  . Years of education: 28  . Highest education level: Not on  file  Occupational History    Employer: [employer]  Social Needs  . Financial resource strain: Not on file  . Food insecurity:    Worry: Not on file    Inability: Not on file  . Transportation needs:    Medical: Not on file    Non-medical: Not on file  Tobacco Use  . Smoking status: Never Smoker  . Smokeless tobacco: Never Used  Substance and Sexual Activity  . Alcohol use: No  . Drug use: No  . Sexual activity: Yes  Lifestyle  . Physical  activity:    Days per week: Not on file    Minutes per session: Not on file  . Stress: Not on file  Relationships  . Social connections:    Talks on phone: Not on file    Gets together: Not on file    Attends religious service: Not on file    Active member of club or organization: Not on file    Attends meetings of clubs or organizations: Not on file    Relationship status: Not on file  . Intimate partner violence:    Fear of current or ex partner: Not on file    Emotionally abused: Not on file    Physically abused: Not on file    Forced sexual activity: Not on file  Other Topics Concern  . Not on file  Social History Narrative   Patient is right handed, resides with husband([name]),two children in a home.      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 Generalized: Well developed, obese female in no acute distress  Head: normocephalic and atraumatic,. Oropharynx benign  Neck: Supple Neurological examination   Mentation: Alert oriented to time, place, history taking. Attention span and concentration appropriate. Recent and remote memory intact. Follows all commands speech and language fluent.   Cranial nerve II-XII: Pupils were equal round reactive to light extraocular movements were full, visual field were full on confrontational test. Facial sensation and strength were normal. hearing was intact to finger rubbing bilaterally. Uvula tongue midline. head turning and shoulder shrug were normal and symmetric.Tongue protrusion into cheek strength was normal. Motor: normal bulk and tone, full strength in the BUE, BLE, fine finger movements normal, no pronator drift. No focal weakness Coordination: finger-nose-finger, heel-to-shin bilaterally, no dysmetria Sensory intact to touch and pinprick and vibratory in the upper and lower  extremities Reflexes: Brachioradialis 2/2, biceps 2/2, triceps 2/2, patellar 2/2, Achilles 2/2, plantar responses were flexor bilaterally. Gait and Station: Rising up from seated position without assistance, normal stance, moderate stride, good arm swing, smooth turning, able to perform tiptoe, and heel walking without difficulty. Tandem gait is steady  DIAGNOSTIC DATA (LABS, IMAGING, TESTING) -    Component Value Date/Time   NA 137 10/13/2016 0953   K 4.1 10/13/2016 0953   CL 101 10/13/2016 0953   CO2 31 10/13/2016 0953   GLUCOSE 92 10/13/2016 0953   GLUCOSE 90 12/27/2005 1118   BUN 14 10/13/2016 0953   CREATININE 0.72 10/13/2016 0953   CALCIUM 9.5 10/13/2016 0953   PROT 7.5 10/13/2016 0953   ALBUMIN 3.8 10/13/2016 0953   AST 17 10/13/2016 0953   ALT 19 10/13/2016 0953   ALKPHOS 73 10/13/2016 0953   BILITOT 1.1 10/13/2016 0263  GFRNONAA 122.03 07/08/2009 0836   Lab Results  Component Value Date   CHOL 179 10/13/2016   HDL 81.20 10/13/2016   LDLCALC 90 10/13/2016   LDLDIRECT 127.9 12/05/2011   TRIG 39.0 10/13/2016   CHOLHDL 2 10/13/2016    Lab Results  Component Value Date   TSH 0.55 10/13/2016      ASSESSMENT AND PLAN  52 y.o. year old female  has a past medical history of Headache(784.0) and Hyperlipidemia. here to follow-up for her migraines. She has at most 1 per month well-controlled on propanolol.  Continue Propranol for migraine, will refill  Avoid migraine triggers, especially skipping meals F/U yearly and prn Call for increase in headache Migraine tracker APP to record headaches [name], [facility], [facility], [facility] [address], [place], [place] [address] [phone]

## 2017-06-07 ENCOUNTER — Ambulatory Visit (INDEPENDENT_AMBULATORY_CARE_PROVIDER_SITE_OTHER): Payer: 59 | Admitting: Nurse Practitioner

## 2017-06-07 ENCOUNTER — Encounter: Payer: Self-pay | Admitting: Nurse Practitioner

## 2017-06-07 DIAGNOSIS — G43009 Migraine without aura, not intractable, without status migrainosus: Secondary | ICD-10-CM | POA: Diagnosis not present

## 2017-06-07 MED ORDER — PROPRANOLOL HCL 40 MG PO TABS: 40.0000 mg | ORAL_TABLET | Freq: Two times a day (BID) | ORAL | 3 refills | Status: DC

## 2017-06-07 NOTE — Progress Notes (Signed)
I have read the note, and I agree with the clinical assessment and plan.  Maricel Swartzendruber K Koya Hunger   

## 2017-06-07 NOTE — Patient Instructions (Signed)
Continue Propranol for migraine, will refill  Avoid migraine triggers, especially skipping meals F/U yearly and prn Call for increase in headache

## 2017-06-17 ENCOUNTER — Other Ambulatory Visit: Payer: Self-pay | Admitting: Internal Medicine

## 2017-08-31 ENCOUNTER — Other Ambulatory Visit: Payer: Self-pay | Admitting: Internal Medicine

## 2017-09-01 ENCOUNTER — Other Ambulatory Visit: Payer: Self-pay | Admitting: Internal Medicine

## 2017-09-15 ENCOUNTER — Other Ambulatory Visit: Payer: Self-pay | Admitting: Internal Medicine

## 2017-09-21 ENCOUNTER — Other Ambulatory Visit: Payer: Self-pay | Admitting: Internal Medicine

## 2017-09-27 ENCOUNTER — Other Ambulatory Visit: Payer: Self-pay | Admitting: Internal Medicine

## 2017-10-28 ENCOUNTER — Other Ambulatory Visit: Payer: Self-pay | Admitting: Internal Medicine

## 2017-10-30 MED FILL — PROPRANOLOL 40 MG TABLET: 40 | 90 days supply | Qty: 180 | Fill #0

## 2017-10-30 MED FILL — MELOXICAM 15 MG TABLET: 15 | 30 days supply | Qty: 30 | Fill #0

## 2017-11-06 ENCOUNTER — Telehealth: Payer: Self-pay | Admitting: *Deleted

## 2017-11-06 NOTE — Telephone Encounter (Signed)
[id number] busy signal will send note for her to check back with congregational nurse if needed [facility]

## 2017-11-09 NOTE — Progress Notes (Signed)
Subjective:    Patient ID: Mary [name], female    DOB: [birth year], 52 y.o.   MRN: [id number]  HPI The patient is here for follow up.  Migraine headaches: She is following with neurology.  She takes propranolol once a day most days, but will increase this to twice a day if she starts to have increased headaches.  She uses Excedrin Migraine only as needed.  Hyperlipidemia: She is taking her medication daily. She is compliant with a low fat/cholesterol diet. She is not exercising regularly. She denies myalgias.   Chronic lower back pain with intermittent flares: She takes Flexeril and meloxicam only as needed.  The medication works well when she takes it.    Medications and allergies reviewed with patient and updated if appropriate.  Patient Active Problem List   Diagnosis Date Noted  . Common migraine 06/07/2017  . Chronic lower back pain 12/21/2015  . Postmenopausal 05/15/2015  . Hyperlipidemia 11/17/2014  . 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 Obesity 11/17/2014  . Knee pain, right 04/09/2013  . Constipation 07/08/2009    Current Outpatient Medications on File Prior to Visit  Medication Sig Dispense Refill  . Aspirin-Acetaminophen-Caffeine (EXCEDRIN MIGRAINE PO) Take by mouth as needed.    Marland Kitchen atorvastatin (LIPITOR) 10 MG tablet Take 1 tablet (10 mg total) by mouth daily. -- Office visit needed for further refills 30 tablet 0  . Calcium Carbonate-Vitamin D (CALCIUM 600+D3) 600-400 MG-UNIT per tablet Take 1 tablet by mouth daily.    . cyclobenzaprine (FLEXERIL) 10 MG tablet TAKE 1/2 TO 1 TABLET BY MOUTH AT BEDTIME AS NEEDED FOR MUSCLE SPASMS. 30 tablet 0  . meloxicam (MOBIC) 15 MG tablet Take 1 tablet (15 mg total) by mouth daily as needed for pain (Back pain). Annual appt due in Sept must see provider for future refills 30 tablet 0  . Multiple Vitamin (MULTIVITAMIN) tablet Take 1 tablet by mouth daily.    . propranolol (INDERAL) 40 MG tablet Take 1 tablet (40 mg total) by mouth 2  (two) times daily. 180 tablet 3   No current facility-administered medications on file prior to visit.     Past Medical History:  Diagnosis Date  . Headache(784.0)   . Hyperlipidemia     Past Surgical History:  Procedure Laterality Date  . CESAREAN SECTION      Social History   Socioeconomic History  . Marital status: Married    Spouse name: [name]  . Number of children: 2  . Years of education: 20  . Highest education level: Not on file  Occupational History    Employer: [employer]  . Financial resource strain: Not on file  . Food insecurity:    Worry: Not on file    Inability: Not on file  . Transportation needs:    Medical: Not on file    Non-medical: Not on file  Tobacco Use  . Smoking status: Never Smoker  . Smokeless tobacco: Never Used  Substance and Sexual Activity  . Alcohol use: No  . Drug use: No  . Sexual activity: Yes  Lifestyle  . Physical activity:    Days per week: Not on file    Minutes per session: Not on file  . Stress: Not on file  Relationships  . Social connections:    Talks on phone: Not on file    Gets together: Not on file    Attends religious service: Not on file    Active member of club or organization:  Not on file    Attends meetings of clubs or organizations: Not on file    Relationship status: Not on file  Other Topics Concern  . Not on file  Social History Narrative   Patient is right handed, resides with husband([name]),two children in a home.      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 Migraines Mother     Review of Systems  Constitutional: Negative for chills and fever.  Respiratory: Negative for cough, shortness of breath and wheezing.   Cardiovascular: Positive for palpitations (occ, related to stress). Negative for chest pain and leg swelling.  Gastrointestinal: Negative for abdominal pain and nausea.  Musculoskeletal: Positive for back pain. Negative for  myalgias.  Neurological: Negative for light-headedness and headaches.       Objective:   Vitals:   11/10/17 0758  BP: (!) 140/94  Pulse: 60  Resp: 16  Temp: 98 F (36.7 C)  SpO2: 99%   BP Readings from Last 3 Encounters:  11/10/17 (!) 140/94  06/07/17 132/82  03/02/17 128/74   Wt Readings from Last 3 Encounters:  11/10/17 182 lb 12.8 oz (82.9 kg)  06/07/17 175 lb (79.4 kg)  03/02/17 190 lb (86.2 kg)   Body mass index is 29.5 kg/m.   Physical Exam    Constitutional: Appears well-developed and well-nourished. No distress.  HENT:  Head: Normocephalic and atraumatic.  Neck: Neck supple. No tracheal deviation present. Mild thyromegaly present.  No cervical lymphadenopathy Cardiovascular: Normal rate, regular rhythm and normal heart sounds.   No murmur heard. No carotid bruit .  No edema Pulmonary/Chest: Effort normal and breath sounds normal. No respiratory distress. No has no wheezes. No rales.  Skin: Skin is warm and dry. Not diaphoretic.  Psychiatric: Normal mood and affect. Behavior is normal.      Assessment & Plan:    See Problem List for Assessment and Plan of chronic medical problems.

## 2017-11-10 ENCOUNTER — Encounter: Payer: Self-pay | Admitting: Internal Medicine

## 2017-11-10 ENCOUNTER — Other Ambulatory Visit (INDEPENDENT_AMBULATORY_CARE_PROVIDER_SITE_OTHER): Payer: 59

## 2017-11-10 ENCOUNTER — Ambulatory Visit (INDEPENDENT_AMBULATORY_CARE_PROVIDER_SITE_OTHER): Payer: 59 | Admitting: Internal Medicine

## 2017-11-10 VITALS — BP 140/94 | HR 60 | Temp 98.0°F | Resp 16 | Ht 66.0 in | Wt 182.8 lb

## 2017-11-10 DIAGNOSIS — E782 Mixed hyperlipidemia: Secondary | ICD-10-CM | POA: Diagnosis not present

## 2017-11-10 DIAGNOSIS — M545 Low back pain, unspecified: Secondary | ICD-10-CM

## 2017-11-10 DIAGNOSIS — G8929 Other chronic pain: Secondary | ICD-10-CM | POA: Diagnosis not present

## 2017-11-10 DIAGNOSIS — G43009 Migraine without aura, not intractable, without status migrainosus: Secondary | ICD-10-CM

## 2017-11-10 LAB — COMPREHENSIVE METABOLIC PANEL
ALBUMIN: 3.9 g/dL (ref 3.5–5.2)
ALT: 13 U/L (ref 0–35)
AST: 14 U/L (ref 0–37)
Alkaline Phosphatase: 71 U/L (ref 39–117)
BUN: 13 mg/dL (ref 6–23)
CHLORIDE: 102 meq/L (ref 96–112)
CO2: 28 mEq/L (ref 19–32)
Calcium: 9.3 mg/dL (ref 8.4–10.5)
Creatinine, Ser: 0.75 mg/dL (ref 0.40–1.20)
GFR: 105.05 mL/min (ref >60.00)
GLUCOSE: 96 mg/dL (ref 70–99)
POTASSIUM: 3.9 meq/L (ref 3.5–5.1)
SODIUM: 139 meq/L (ref 135–145)
Total Bilirubin: 0.8 mg/dL (ref 0.2–1.2)
Total Protein: 7.6 g/dL (ref 6.0–8.3)

## 2017-11-10 LAB — LIPID PANEL
Cholesterol: 154 mg/dL (ref 0–200)
HDL: 74.2 mg/dL (ref >39.00)
LDL CALC: 71 mg/dL (ref 0–99)
NONHDL: 80.15
Total CHOL/HDL Ratio: 2
Triglycerides: 44 mg/dL (ref 0.0–149.0)
VLDL: 8.8 mg/dL (ref 0.0–40.0)

## 2017-11-10 LAB — CBC WITH DIFFERENTIAL/PLATELET
BASOS PCT: 0.3 % (ref 0.0–3.0)
Basophils Absolute: 0 10*3/uL (ref 0.0–0.1)
EOS PCT: 1.9 % (ref 0.0–5.0)
Eosinophils Absolute: 0.1 10*3/uL (ref 0.0–0.7)
HEMATOCRIT: 38.1 % (ref 36.0–46.0)
Hemoglobin: 12.8 g/dL (ref 12.0–15.0)
Lymphocytes Relative: 40.6 % (ref 12.0–46.0)
Lymphs Abs: 1.8 10*3/uL (ref 0.7–4.0)
MCHC: 33.6 g/dL (ref 30.0–36.0)
MCV: 92.5 fl (ref 78.0–100.0)
MONO ABS: 0.5 10*3/uL (ref 0.1–1.0)
Monocytes Relative: 11.3 % (ref 3.0–12.0)
Neutro Abs: 2 10*3/uL (ref 1.4–7.7)
Neutrophils Relative %: 45.9 % (ref 43.0–77.0)
Platelets: 177 10*3/uL (ref 150.0–400.0)
RBC: 4.12 Mil/uL (ref 3.87–5.11)
RDW: 14.4 % (ref 11.5–15.5)
WBC: 4.3 10*3/uL (ref 4.0–10.5)

## 2017-11-10 MED ORDER — ATORVASTATIN CALCIUM 10 MG PO TABS: 10.0000 mg | ORAL_TABLET | Freq: Every day | ORAL | 3 refills | Status: DC

## 2017-11-10 MED ORDER — CYCLOBENZAPRINE HCL 10 MG PO TABS: ORAL_TABLET | ORAL | 3 refills | Status: DC

## 2017-11-10 MED FILL — ATORVASTATIN 10 MG TABLET: 10 | 90 days supply | Qty: 90 | Fill #0

## 2017-11-10 MED FILL — CYCLOBENZAPRINE HCL 10 MG T: 10 | 30 days supply | Qty: 30 | Fill #0

## 2017-11-10 NOTE — Assessment & Plan Note (Addendum)
Check lipid panel, cmp, cbc Continue daily statin Regular exercise and healthy diet encouraged

## 2017-11-10 NOTE — Patient Instructions (Addendum)
  Tests ordered today. Your results will be released to [facility] (or called to you) after review, usually within 72hours after test completion. If any changes need to be made, you will be notified at that same time.  Medications reviewed and updated.  Changes include :   none  Your prescription(s) have been submitted to your pharmacy. Please take as directed and contact our office if you believe you are having problem(s) with the medication(s).   Please followup in 7 months for a physical

## 2017-11-10 NOTE — Assessment & Plan Note (Signed)
Intermittent Takes meloxicam and flexeril as needed - advised to take as infrequently as possible  Continue above

## 2017-11-10 NOTE — Assessment & Plan Note (Signed)
Follows with neuro Controlled Taking propranolol daily most days, increases to BID if migraines increase excedrin migraine prn - she does not take often

## 2017-11-11 ENCOUNTER — Encounter: Payer: Self-pay | Admitting: Internal Medicine

## 2017-11-25 ENCOUNTER — Other Ambulatory Visit: Payer: Self-pay | Admitting: Internal Medicine

## 2017-12-01 ENCOUNTER — Encounter: Payer: Self-pay | Admitting: Internal Medicine

## 2017-12-01 ENCOUNTER — Ambulatory Visit (INDEPENDENT_AMBULATORY_CARE_PROVIDER_SITE_OTHER): Payer: 59 | Admitting: Internal Medicine

## 2017-12-01 VITALS — BP 124/78 | HR 63 | Temp 98.0°F | Resp 16 | Ht 66.0 in | Wt 182.1 lb

## 2017-12-01 DIAGNOSIS — M542 Cervicalgia: Secondary | ICD-10-CM

## 2017-12-01 DIAGNOSIS — M549 Dorsalgia, unspecified: Secondary | ICD-10-CM | POA: Diagnosis not present
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
## 2017-12-01 NOTE — Patient Instructions (Addendum)
Your pain is muscular in nature.  Take flexeril at night for the next few nights.  Take meloxicam daily.    Apply heat.       Muscle Strain A muscle strain is an injury that occurs when a muscle is stretched beyond its normal length. Usually a small number of muscle fibers are torn when this happens. Muscle strain is rated in degrees. First-degree strains have the least amount of muscle fiber tearing and pain. Second-degree and third-degree strains have increasingly more tearing and pain. Usually, recovery from muscle strain takes 1-2 weeks. Complete healing takes 5-6 weeks. What are the causes? Muscle strain happens when a sudden, violent force placed on a muscle stretches it too far. This may occur with lifting, sports, or a fall. What increases the risk? Muscle strain is especially common in athletes. What are the signs or symptoms? At the site of the muscle strain, there may be:  Pain.  Bruising.  Swelling.  Difficulty using the muscle due to pain or lack of normal function.  How is this diagnosed? Your health care provider will perform a physical exam and ask about your medical history. How is this treated? Often, the best treatment for a muscle strain is resting, icing, and applying cold compresses to the injured area. Follow these instructions at home:  Use the PRICE method of treatment to promote muscle healing during the first 2-3 days after your injury. The PRICE method involves: ? Protecting the muscle from being injured again. ? Restricting your activity and resting the injured body part. ? Icing your injury. To do this, put ice in a plastic bag. Place a towel between your skin and the bag. Then, apply the ice and leave it on from 15-20 minutes each hour. After the third day, switch to moist heat packs. ? Apply compression to the injured area with a splint or elastic bandage. Be careful not to wrap it too tightly. This may interfere with blood circulation or  increase swelling. ? Elevate the injured body part above the level of your heart as often as you can.  Only take over-the-counter or prescription medicines for pain, discomfort, or fever as directed by your health care provider.  Warming up prior to exercise helps to prevent future muscle strains. Contact a health care provider if:  You have increasing pain or swelling in the injured area.  You have numbness, tingling, or a significant loss of strength in the injured area. This information is not intended to replace advice given to you by your health care provider. Make sure you discuss any questions you have with your health care provider. Document Released: 01/03/2005 Document Revised: 06/11/2015 Document Reviewed: 08/02/2012 Elsevier Interactive Patient Education  2017 [employer].
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
## 2018-01-05 DIAGNOSIS — H524 Presbyopia: Secondary | ICD-10-CM | POA: Diagnosis not present

## 2018-01-12 ENCOUNTER — Encounter: Payer: Self-pay | Admitting: Nurse Practitioner

## 2018-01-24 MED FILL — CYCLOBENZAPRINE HCL 10 MG T: 10 | 90 days supply | Qty: 90 | Fill #1

## 2018-02-14 MED FILL — ATORVASTATIN 10 MG TABLET: 10 | 90 days supply | Qty: 90 | Fill #1

## 2018-03-08 ENCOUNTER — Ambulatory Visit (INDEPENDENT_AMBULATORY_CARE_PROVIDER_SITE_OTHER): Payer: 59 | Admitting: Family Medicine

## 2018-03-08 ENCOUNTER — Encounter: Payer: Self-pay | Admitting: Family Medicine

## 2018-03-08 VITALS — BP 128/84 | HR 74 | Temp 98.2°F | Ht 66.0 in | Wt 189.1 lb

## 2018-03-08 DIAGNOSIS — J111 Influenza due to unidentified influenza virus with other respiratory manifestations: Secondary | ICD-10-CM | POA: Diagnosis not present

## 2018-03-08 DIAGNOSIS — R6889 Other general symptoms and signs: Secondary | ICD-10-CM | POA: Diagnosis not present

## 2018-03-08 LAB — POC INFLUENZA A&B (BINAX/QUICKVUE)
INFLUENZA A, POC: NEGATIVE
Influenza B, POC: NEGATIVE

## 2018-03-08 NOTE — Patient Instructions (Addendum)
Your symptoms are most likely related to a viral illness.  Please drink plenty of water so that your urine is pale yellow or clear.   Also, get plenty of rest, use tylenol or ibuprofen as needed for discomfort.  Follow up if symptoms do not improve in 3 to 4 days, worsen, or you develop a fever >101.

## 2018-03-08 NOTE — Progress Notes (Signed)
Patient ID: Mary [name], female   DOB: [birth year], 53 y.o.   MRN: [id number]  PCP: Binnie Rail, MD  Subjective:  Mary [name] is a 53 y.o. year old very pleasant female patient who presents with flu like symptoms including chills,  and body aches.  -other symptoms: mild headache -started: one day ago  -inside 48 hour treatment window if needed for tamiflu: Yes -high risk condition (children <5, adults >65, chronic pulmonary or cardiac condition, immunosuppression, pregnancy, nursing home resident, morbid obesity) : No -symptoms are not worsening, improvement with body aches and chills with aleve -previous treatments: Aleve has provided benefit - patient did receive flu shot this year.  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 Musculoskeletal neck pain 12/01/2017  . Musculoskeletal back pain 12/01/2017  . Common migraine 06/07/2017  . Chronic lower back pain 12/21/2015  . Postmenopausal 05/15/2015  . Hyperlipidemia 11/17/2014  . 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 Obesity 11/17/2014  . Knee pain, right 04/09/2013  . Constipation 07/08/2009    Medications- reviewed  Current Outpatient Medications  Medication Sig Dispense Refill  . Aspirin-Acetaminophen-Caffeine (EXCEDRIN MIGRAINE PO) Take by mouth as needed.    Marland Kitchen atorvastatin (LIPITOR) 10 MG tablet Take 1 tablet (10 mg total) by mouth daily. 90 tablet 1  . Calcium Carbonate-Vitamin D (CALCIUM 600+D3) 600-400 MG-UNIT per tablet Take 1 tablet by mouth daily.    . cyclobenzaprine (FLEXERIL) 10 MG tablet TAKE 1/2 TO 1 TABLET BY MOUTH AT BEDTIME AS NEEDED FOR MUSCLE SPASMS. 30 tablet 3  . meloxicam (MOBIC) 15 MG tablet Take 1 tablet (15 mg total) by mouth daily as needed  for pain (Back pain). Annual appt due in Sept must see provider for future refills 30 tablet 0  . Multiple Vitamin (MULTIVITAMIN) tablet Take 1 tablet by mouth daily.    . propranolol (INDERAL) 40 MG tablet Take 1 tablet (40 mg total) by mouth 2 (two) times daily. 180 tablet 3   No current facility-administered medications for this visit.     Objective: BP 128/84 (BP Location: Right Arm, Patient Position: Sitting, Cuff Size: Normal)   Pulse 74   Temp 98.2 F (36.8 C) (Oral)   Ht 5\' 6"  (1.676 m)   Wt 189 lb 1.3 oz (85.8 kg)   SpO2 99%   BMI 30.52 kg/m  Gen: NAD, appears fatigued HEENT: Turbinates erythematous, TMs normal bilaterally, oropharynx is clear and moist CV: RRR no murmurs rubs or gallops Lungs: CTAB no crackles, wheeze, rhonchi Skin: warm, dry, no rash   Assessment/Plan: 1. Flu-like symptoms POC influenza is negative today; improvement in symptoms with aleve. Symptoms are most consistent with viral illness. Advised patient on supportive measures:  Get rest, drink plenty of fluids, and use tylenol or ibuprofen as needed  Follow up if fever >101, if symptoms worsen or if symptoms are not improved in 3 days. Patient verbalizes understanding.   - POC Influenza A&B (Binax test)  Finally, we reviewed reasons to return to care including if symptoms worsen or persist or new concerns arise.    Mary Quint, FNP

## 2018-03-21 ENCOUNTER — Other Ambulatory Visit: Payer: Self-pay | Admitting: Internal Medicine

## 2018-03-21 MED FILL — MELOXICAM 15 MG TABLET: 15 | 90 days supply | Qty: 90 | Fill #0

## 2018-03-22 MED FILL — PROPRANOLOL 40 MG TABLET: 40 | 90 days supply | Qty: 180 | Fill #1

## 2018-03-28 DIAGNOSIS — Z130 Encounter for screening for diseases of the blood and blood-forming organs and certain disorders involving the immune mechanism: Secondary | ICD-10-CM | POA: Diagnosis not present

## 2018-03-28 DIAGNOSIS — Z6829 Body mass index (BMI) 29.0-29.9, adult: Secondary | ICD-10-CM | POA: Diagnosis not present

## 2018-03-28 DIAGNOSIS — Z1231 Encounter for screening mammogram for malignant neoplasm of breast: Secondary | ICD-10-CM | POA: Diagnosis not present

## 2018-03-28 DIAGNOSIS — Z1389 Encounter for screening for other disorder: Secondary | ICD-10-CM | POA: Diagnosis not present

## 2018-03-28 DIAGNOSIS — N951 Menopausal and female climacteric states: Secondary | ICD-10-CM | POA: Diagnosis not present

## 2018-03-28 DIAGNOSIS — Z01419 Encounter for gynecological examination (general) (routine) without abnormal findings: Secondary | ICD-10-CM | POA: Diagnosis not present

## 2018-03-28 LAB — HM MAMMOGRAPHY

## 2018-04-18 ENCOUNTER — Telehealth: Payer: Self-pay | Admitting: *Deleted

## 2018-04-18 NOTE — Telephone Encounter (Signed)
040120/tct-Reighn/wellness check on family.  Have been sick butr all are well at this time.

## 2018-05-10 ENCOUNTER — Other Ambulatory Visit: Payer: Self-pay | Admitting: Internal Medicine

## 2018-05-10 MED FILL — ATORVASTATIN 10 MG TABLET: 10 | 90 days supply | Qty: 90 | Fill #2

## 2018-05-11 MED FILL — CYCLOBENZAPRINE HCL 10 MG T: 10 | 30 days supply | Qty: 30 | Fill #0

## 2018-05-16 ENCOUNTER — Encounter: Payer: Self-pay | Admitting: *Deleted

## 2018-05-16 NOTE — Congregational Nurse Program (Signed)
05/16/18/tct-Aishah/doing well kids are still dealing with death of father, but she is doing well aND STAYING BUSY.  Older children are looking after younger during work hours.  Family assistance is near by.  Continues to work.  Has no needs.Verdie Wilms,BSN,RN3,CCM,CN

## 2018-05-21 ENCOUNTER — Telehealth: Payer: Self-pay

## 2018-05-21 NOTE — Telephone Encounter (Signed)
Spoke with the patient and she has given verbal consent to doing a doxy.me visit with Judson Roch on 05/23/2018 at 8:45 AM. E-mail and text message have been sent.  E-mail: [email or web address] Text: [phone] Phill Mutter)

## 2018-05-23 ENCOUNTER — Ambulatory Visit (INDEPENDENT_AMBULATORY_CARE_PROVIDER_SITE_OTHER): Payer: 59 | Admitting: Neurology

## 2018-05-23 ENCOUNTER — Telehealth: Payer: Self-pay | Admitting: Neurology

## 2018-05-23 ENCOUNTER — Other Ambulatory Visit: Payer: Self-pay

## 2018-05-23 ENCOUNTER — Encounter: Payer: Self-pay | Admitting: Neurology

## 2018-05-23 DIAGNOSIS — G43009 Migraine without aura, not intractable, without status migrainosus: Secondary | ICD-10-CM

## 2018-05-23 MED ORDER — PROPRANOLOL HCL 40 MG PO TABS: 40.0000 mg | ORAL_TABLET | Freq: Every day | ORAL | 1 refills | Status: DC

## 2018-05-23 NOTE — Progress Notes (Signed)
    Virtual Visit via Video Note  I connected with Mary [name] on 05/23/18 at  8:45 AM EDT by a video enabled telemedicine application and verified that I am speaking with the correct person using two identifiers.  Location: Patient: At her place of residence Provider: At her place of residence   I discussed the limitations of evaluation and management by telemedicine and the availability of in person appointments. The patient expressed understanding and agreed to proceed.  History of Present Illness: 05/23/2018 SS: Mary [name] is a can you please tell Mary [name] 53 year old female with a history of migraine headache under good control.  She is prescribed propanolol 40 mg twice daily.  She reports for the last few months she has been only taking it once daily.  She reports she only has about 1 headache per month, she will take Excedrin Migraine and headache will resolve.  She reports she has been doing quite well.  She denies any new problems or concerns.  She does follow with her primary care doctor regularly.   06/07/2017 CM: Mary [name], 53 year old female returns for yearly follow-up.  She has a history of migraine headaches which are  well controlled on propanolol 40 mg twice daily. Her headaches are rare at this point. She may have 1  headache per month, less in intensity and severity. Her biggest trigger is not eating breakfast and  getting a headache around 11:00. She goes to [employer] 3  times a week for exercise She returns for reevaluation. She needs refills on her medications. She has had no new medical problems since last seen   Observations/Objective: Alert, answers questions appropriately, follows commands, facial symmetry noted, speech is clear, no problems with walking  Her last blood pressure in February 2020 was 128/84, heart rate 74.  Assessment and Plan: 1.  Migraine headaches  Overall her headaches have been under excellent control.  She is only been taking propanolol 40 mg  once daily.  We discussed she could do a trial off the medication to see how her headaches do.  At this point she would like to continue taking propanolol 40 mg once daily until she can discuss her blood pressure management with her primary care doctor.  She would like to continue to follow-up with [place] at least one more time to ensure her headaches are under good control.  I will send in a refill of her propanolol 40 mg once daily.  She will discuss with her primary care doctor about blood pressure management.  At this point her headaches are under excellent control. She will take Excedrin Migraine in the event she has a headache.   Follow Up Instructions: 1 year   I discussed the assessment and treatment plan with the patient. The patient was provided an opportunity to ask questions and all were answered. The patient agreed with the plan and demonstrated an understanding of the instructions.   The patient was advised to call back or seek an in-person evaluation if the symptoms worsen or if the condition fails to improve as anticipated.  I provided 20 minutes of non-face-to-face time during this encounter.   Evangeline Dakin, DNP  [facility] [address] [place], [place] [address] [phone]

## 2018-05-23 NOTE — Telephone Encounter (Signed)
Pt states that her propranolol (INDERAL) 40 MG tablet was sent to the wrong pharmacy and it needs to be sent to [facility]. Please advise.

## 2018-05-23 NOTE — Telephone Encounter (Signed)
I was able to call [facility] and cancel the prescription for propranolol (INDERAL) 40 MG tablet. A new script has been sent to [facility] outpatient pharmacy.

## 2018-05-23 NOTE — Progress Notes (Signed)
I have read the note, and I agree with the clinical assessment and plan.  Jonni Oelkers K Nikodem Leadbetter   

## 2018-06-13 ENCOUNTER — Ambulatory Visit: Payer: 59 | Admitting: Nurse Practitioner

## 2018-07-11 NOTE — Progress Notes (Signed)
Subjective:    Patient ID: Mary [name], female    DOB: 1965-08-18, 53 y.o.   MRN: [id number]  HPI She is here for a physical exam.    Her left middle finger hurts at the MCP joint and with bending.  It sometimes it locks up.   She continues to have intermittent chronic lower back pain.  She takes the meloxicam and flexeril as needed.    Medications and allergies reviewed with patient and updated if appropriate.  Patient Active Problem List   Diagnosis Date Noted  . Trigger finger, left middle finger 07/12/2018  . Musculoskeletal neck pain 12/01/2017  . Musculoskeletal back pain 12/01/2017  . Common migraine 06/07/2017  . Chronic lower back pain 12/21/2015  . Postmenopausal 05/15/2015  . Hyperlipidemia 11/17/2014  . 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 Obesity 11/17/2014  . Knee pain, right 04/09/2013  . Constipation 07/08/2009    Current Outpatient Medications on File Prior to Visit  Medication Sig Dispense Refill  . Aspirin-Acetaminophen-Caffeine (EXCEDRIN MIGRAINE PO) Take by mouth as needed.    Marland Kitchen atorvastatin (LIPITOR) 10 MG tablet Take 1 tablet (10 mg total) by mouth daily. 90 tablet 1  . Calcium Carbonate-Vitamin D (CALCIUM 600+D3) 600-400 MG-UNIT per tablet Take 1 tablet by mouth daily.    . cyclobenzaprine (FLEXERIL) 10 MG tablet TAKE 1/2 TO 1 TABLET BY MOUTH AT BEDTIME AS NEEDED FOR MUSCLE SPASMS. 30 tablet 1  . meloxicam (MOBIC) 15 MG tablet TAKE 1 TABLET BY MOUTH DAILY AS NEEDED 90 tablet 0  . Multiple Vitamin (MULTIVITAMIN) tablet Take 1 tablet by mouth daily.    . propranolol (INDERAL) 40 MG tablet Take 1 tablet (40 mg total) by mouth daily. 180 tablet 1   No current facility-administered medications on file prior to visit.     Past Medical History:  Diagnosis Date  . Headache(784.0)   . Hyperlipidemia     Past Surgical History:  Procedure Laterality Date  . CESAREAN SECTION      Social History   Socioeconomic History  . Marital status: Widowed   Spouse name: [name]  . Number of children: 2  . Years of education: 68  . Highest education level: Not on file  Occupational History    Employer: [employer]  . Financial resource strain: Not on file  . Food insecurity    Worry: Not on file    Inability: Not on file  . Transportation needs    Medical: Not on file    Non-medical: Not on file  Tobacco Use  . Smoking status: Never Smoker  . Smokeless tobacco: Never Used  Substance and Sexual Activity  . Alcohol use: No  . Drug use: No  . Sexual activity: Yes  Lifestyle  . Physical activity    Days per week: Not on file    Minutes per session: Not on file  . Stress: Not on file  Relationships  . Social Herbalist on phone: Not on file    Gets together: Not on file    Attends religious service: Not on file    Active member of club or organization: Not on file    Attends meetings of clubs or organizations: Not on file    Relationship status: Not on file  Other Topics Concern  . Not on file  Social History Narrative   Patient is right handed, resides with husband([name]),two children in a home.      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 Migraines Mother     Review of Systems  Constitutional: Negative for chills and fever.  Eyes: Negative for visual disturbance.  Respiratory: Negative for cough, shortness of breath and wheezing.   Cardiovascular: Negative for chest pain, palpitations and leg swelling.  Gastrointestinal: Positive for constipation. Negative for abdominal pain, blood in stool, diarrhea and nausea.       No gerd  Genitourinary: Negative for dysuria and hematuria.  Musculoskeletal: Positive for arthralgias (knees, left middle finger) and back pain.  Skin: Negative for color change and rash.  Neurological: Positive for light-headedness (occasional) and headaches (rare migraines).  Psychiatric/Behavioral: Negative for dysphoric mood. The  patient is not nervous/anxious.        Objective:   Vitals:   07/12/18 0853  BP: 132/84  Pulse: (!) 58  Resp: 16  Temp: 98.4 F (36.9 C)  SpO2: 99%   Filed Weights   07/12/18 0853  Weight: 186 lb 1.9 oz (84.4 kg)   Body mass index is 30.04 kg/m.  BP Readings from Last 3 Encounters:  07/12/18 132/84  03/08/18 128/84  12/01/17 124/78    Wt Readings from Last 3 Encounters:  07/12/18 186 lb 1.9 oz (84.4 kg)  03/08/18 189 lb 1.3 oz (85.8 kg)  12/01/17 182 lb 1.9 oz (82.6 kg)     Physical Exam Constitutional: She appears well-developed and well-nourished. No distress.  HENT:  Head: Normocephalic and atraumatic.  Right Ear: External ear normal. Normal ear canal and TM Left Ear: External ear normal.  Normal ear canal and TM Mouth/Throat: Oropharynx is clear and moist.  Eyes: Conjunctivae and EOM are normal.  Neck: Neck supple. No tracheal deviation present. No thyromegaly present.  No carotid bruit  Cardiovascular: Normal rate, regular rhythm and normal heart sounds.  No murmur heard.  No edema. Pulmonary/Chest: Effort normal and breath sounds normal. No respiratory distress. She has no wheezes. She has no rales.  Breast: deferred   Abdominal: Soft. She exhibits no distension. There is no tenderness.  Lymphadenopathy: She has no cervical adenopathy.  Skin: Skin is warm and dry. She is not diaphoretic.  Psychiatric: She has a normal mood and affect. Her behavior is normal.        Assessment & Plan:   Physical exam: Screening blood work ordered Immunizations   Up to date  Colonoscopy   Never had one  - referred today Mammogram  Up to date  Gyn    Up to date  Eye exams  Up to date  Exercise  walking Weight  Advised weight loss Skin no concerns Substance abuse   none  See Problem List for Assessment and Plan of chronic medical problems.   FU in one year

## 2018-07-11 NOTE — Patient Instructions (Addendum)
Tests ordered today. Your results will be released to [facility] (or called to you) after review, usually within 72hours after test completion. If any changes need to be made, you will be notified at that same time.  All other Health Maintenance issues reviewed.   All recommended immunizations and age-appropriate screenings are up-to-date or discussed.  No immunizations administered today.   Medications reviewed and updated.  Changes include :  none   Your prescription(s) have been submitted to your pharmacy. Please take as directed and contact our office if you believe you are having problem(s) with the medication(s).  A referral was ordered for  GI for a colonosopy  Please followup in one year   Health Maintenance, Female Adopting a healthy lifestyle and getting preventive care can go a long way to promote health and wellness. Talk with your health care provider about what schedule of regular examinations is right for you. This is a good chance for you to check in with your provider about disease prevention and staying healthy. In between checkups, there are plenty of things you can do on your own. Experts have done a lot of research about which lifestyle changes and preventive measures are most likely to keep you healthy. Ask your health care provider for more information. Weight and diet Eat a healthy diet  Be sure to include plenty of vegetables, fruits, low-fat dairy products, and lean protein.  Do not eat a lot of foods high in solid fats, added sugars, or salt.  Get regular exercise. This is one of the most important things you can do for your health. ? Most adults should exercise for at least 150 minutes each week. The exercise should increase your heart rate and make you sweat (moderate-intensity exercise). ? Most adults should also do strengthening exercises at least twice a week. This is in addition to the moderate-intensity exercise. Maintain a healthy weight  Body mass  index (BMI) is a measurement that can be used to identify possible weight problems. It estimates body fat based on height and weight. Your health care provider can help determine your BMI and help you achieve or maintain a healthy weight.  For females 72 years of age and older: ? A BMI below 18.5 is considered underweight. ? A BMI of 18.5 to 24.9 is normal. ? A BMI of 25 to 29.9 is considered overweight. ? A BMI of 30 and above is considered obese. Watch levels of cholesterol and blood lipids  You should start having your blood tested for lipids and cholesterol at 51 years of age, then have this test every 5 years.  You may need to have your cholesterol levels checked more often if: ? Your lipid or cholesterol levels are high. ? You are older than 51 years of age. ? You are at high risk for heart disease. Cancer screening Lung Cancer  Lung cancer screening is recommended for adults 63-69 years old who are at high risk for lung cancer because of a history of smoking.  A yearly low-dose CT scan of the lungs is recommended for people who: ? Currently smoke. ? Have quit within the past 15 years. ? Have at least a 30-pack-year history of smoking. A pack year is smoking an average of one pack of cigarettes a day for 1 year.  Yearly screening should continue until it has been 15 years since you quit.  Yearly screening should stop if you develop a health problem that would prevent you from having lung cancer treatment.  Breast Cancer  Practice breast self-awareness. This means understanding how your breasts normally appear and feel.  It also means doing regular breast self-exams. Let your health care provider know about any changes, no matter how small.  If you are in your 20s or 30s, you should have a clinical breast exam (CBE) by a health care provider every 1-3 years as part of a regular health exam.  If you are 38 or older, have a CBE every year. Also consider having a breast X-ray  (mammogram) every year.  If you have a family history of breast cancer, talk to your health care provider about genetic screening.  If you are at high risk for breast cancer, talk to your health care provider about having an MRI and a mammogram every year.  Breast cancer gene (BRCA) assessment is recommended for women who have family members with BRCA-related cancers. BRCA-related cancers include: ? Breast. ? Ovarian. ? Tubal. ? Peritoneal cancers.  Results of the assessment will determine the need for genetic counseling and BRCA1 and BRCA2 testing. Cervical Cancer Your health care provider may recommend that you be screened regularly for cancer of the pelvic organs (ovaries, uterus, and vagina). This screening involves a pelvic examination, including checking for microscopic changes to the surface of your cervix (Pap test). You may be encouraged to have this screening done every 3 years, beginning at age 75.  For women ages 14-65, health care providers may recommend pelvic exams and Pap testing every 3 years, or they may recommend the Pap and pelvic exam, combined with testing for human papilloma virus (HPV), every 5 years. Some types of HPV increase your risk of cervical cancer. Testing for HPV may also be done on women of any age with unclear Pap test results.  Other health care providers may not recommend any screening for nonpregnant women who are considered low risk for pelvic cancer and who do not have symptoms. Ask your health care provider if a screening pelvic exam is right for you.  If you have had past treatment for cervical cancer or a condition that could lead to cancer, you need Pap tests and screening for cancer for at least 20 years after your treatment. If Pap tests have been discontinued, your risk factors (such as having a new sexual partner) need to be reassessed to determine if screening should resume. Some women have medical problems that increase the chance of getting  cervical cancer. In these cases, your health care provider may recommend more frequent screening and Pap tests. Colorectal Cancer  This type of cancer can be detected and often prevented.  Routine colorectal cancer screening usually begins at 51 years of age and continues through 51 years of age.  Your health care provider may recommend screening at an earlier age if you have risk factors for colon cancer.  Your health care provider may also recommend using home test kits to check for hidden blood in the stool.  A small camera at the end of a tube can be used to examine your colon directly (sigmoidoscopy or colonoscopy). This is done to check for the earliest forms of colorectal cancer.  Routine screening usually begins at age 51.  Direct examination of the colon should be repeated every 5-10 years through 51 years of age. However, you may need to be screened more often if early forms of precancerous polyps or small growths are found. Skin Cancer  Check your skin from head to toe regularly.  Tell your health care  provider about any new moles or changes in moles, especially if there is a change in a mole's shape or color.  Also tell your health care provider if you have a mole that is larger than the size of a pencil eraser.  Always use sunscreen. Apply sunscreen liberally and repeatedly throughout the day.  Protect yourself by wearing long sleeves, pants, a wide-brimmed hat, and sunglasses whenever you are outside. Heart disease, diabetes, and high blood pressure  High blood pressure causes heart disease and increases the risk of stroke. High blood pressure is more likely to develop in: ? People who have blood pressure in the high end of the normal range (130-139/85-89 mm Hg). ? People who are overweight or obese. ? People who are African American.  If you are 76-74 years of age, have your blood pressure checked every 3-5 years. If you are 41 years of age or older, have your blood  pressure checked every year. You should have your blood pressure measured twice-once when you are at a hospital or clinic, and once when you are not at a hospital or clinic. Record the average of the two measurements. To check your blood pressure when you are not at a hospital or clinic, you can use: ? An automated blood pressure machine at a pharmacy. ? A home blood pressure monitor.  If you are between 36 years and 74 years old, ask your health care provider if you should take aspirin to prevent strokes.  Have regular diabetes screenings. This involves taking a blood sample to check your fasting blood sugar level. ? If you are at a normal weight and have a low risk for diabetes, have this test once every three years after 51 years of age. ? If you are overweight and have a high risk for diabetes, consider being tested at a younger age or more often. Preventing infection Hepatitis B  If you have a higher risk for hepatitis B, you should be screened for this virus. You are considered at high risk for hepatitis B if: ? You were born in a country where hepatitis B is common. Ask your health care provider which countries are considered high risk. ? Your parents were born in a high-risk country, and you have not been immunized against hepatitis B (hepatitis B vaccine). ? You have HIV or AIDS. ? You use needles to inject street drugs. ? You live with someone who has hepatitis B. ? You have had sex with someone who has hepatitis B. ? You get hemodialysis treatment. ? You take certain medicines for conditions, including cancer, organ transplantation, and autoimmune conditions. Hepatitis C  Blood testing is recommended for: ? Everyone born from [birth year] through 1963. ? Anyone with known risk factors for hepatitis C. Sexually transmitted infections (STIs)  You should be screened for sexually transmitted infections (STIs) including gonorrhea and chlamydia if: ? You are sexually active and are younger  than 51 years of age. ? You are older than 51 years of age and your health care provider tells you that you are at risk for this type of infection. ? Your sexual activity has changed since you were last screened and you are at an increased risk for chlamydia or gonorrhea. Ask your health care provider if you are at risk.  If you do not have HIV, but are at risk, it may be recommended that you take a prescription medicine daily to prevent HIV infection. This is called pre-exposure prophylaxis (PrEP). You are considered at  risk if: ? You are sexually active and do not regularly use condoms or know the HIV status of your partner(s). ? You take drugs by injection. ? You are sexually active with a partner who has HIV. Talk with your health care provider about whether you are at high risk of being infected with HIV. If you choose to begin PrEP, you should first be tested for HIV. You should then be tested every 3 months for as long as you are taking PrEP. Pregnancy  If you are premenopausal and you may become pregnant, ask your health care provider about preconception counseling.  If you may become pregnant, take 400 to 800 micrograms (mcg) of folic acid every day.  If you want to prevent pregnancy, talk to your health care provider about birth control (contraception). Osteoporosis and menopause  Osteoporosis is a disease in which the bones lose minerals and strength with aging. This can result in serious bone fractures. Your risk for osteoporosis can be identified using a bone density scan.  If you are 4 years of age or older, or if you are at risk for osteoporosis and fractures, ask your health care provider if you should be screened.  Ask your health care provider whether you should take a calcium or vitamin D supplement to lower your risk for osteoporosis.  Menopause may have certain physical symptoms and risks.  Hormone replacement therapy may reduce some of these symptoms and risks. Talk  to your health care provider about whether hormone replacement therapy is right for you. Follow these instructions at home:  Schedule regular health, dental, and eye exams.  Stay current with your immunizations.  Do not use any tobacco products including cigarettes, chewing tobacco, or electronic cigarettes.  If you are pregnant, do not drink alcohol.  If you are breastfeeding, limit how much and how often you drink alcohol.  Limit alcohol intake to no more than 1 drink per day for nonpregnant women. One drink equals 12 ounces of beer, 5 ounces of wine, or 1 ounces of hard liquor.  Do not use street drugs.  Do not share needles.  Ask your health care provider for help if you need support or information about quitting drugs.  Tell your health care provider if you often feel depressed.  Tell your health care provider if you have ever been abused or do not feel safe at home. This information is not intended to replace advice given to you by your health care provider. Make sure you discuss any questions you have with your health care provider. Document Released: 07/19/2010 Document Revised: 06/11/2015 Document Reviewed: 10/07/2014 Elsevier Interactive Patient Education  2019 [employer].

## 2018-07-12 ENCOUNTER — Encounter: Payer: Self-pay | Admitting: Internal Medicine

## 2018-07-12 ENCOUNTER — Ambulatory Visit (INDEPENDENT_AMBULATORY_CARE_PROVIDER_SITE_OTHER): Payer: 59 | Admitting: Internal Medicine

## 2018-07-12 ENCOUNTER — Other Ambulatory Visit: Payer: Self-pay

## 2018-07-12 ENCOUNTER — Other Ambulatory Visit (INDEPENDENT_AMBULATORY_CARE_PROVIDER_SITE_OTHER): Payer: 59

## 2018-07-12 VITALS — BP 132/84 | HR 58 | Temp 98.4°F | Resp 16 | Ht 66.0 in | Wt 186.1 lb

## 2018-07-12 DIAGNOSIS — E6609 Other obesity due to excess calories: Secondary | ICD-10-CM

## 2018-07-12 DIAGNOSIS — G43009 Migraine without aura, not intractable, without status migrainosus: Secondary | ICD-10-CM | POA: Diagnosis not present

## 2018-07-12 DIAGNOSIS — Z114 Encounter for screening for human immunodeficiency virus [HIV]: Secondary | ICD-10-CM | POA: Diagnosis not present

## 2018-07-12 DIAGNOSIS — E782 Mixed hyperlipidemia: Secondary | ICD-10-CM

## 2018-07-12 DIAGNOSIS — Z6830 Body mass index (BMI) 30.0-30.9, adult: Secondary | ICD-10-CM

## 2018-07-12 DIAGNOSIS — Z0000 Encounter for general adult medical examination without abnormal findings: Secondary | ICD-10-CM | POA: Diagnosis not present

## 2018-07-12 DIAGNOSIS — M545 Low back pain, unspecified: Secondary | ICD-10-CM

## 2018-07-12 DIAGNOSIS — M65332 Trigger finger, left middle finger: Secondary | ICD-10-CM | POA: Insufficient documentation

## 2018-07-12 DIAGNOSIS — Z1211 Encounter for screening for malignant neoplasm of colon: Secondary | ICD-10-CM

## 2018-07-12 DIAGNOSIS — G8929 Other chronic pain: Secondary | ICD-10-CM

## 2018-07-12 LAB — LIPID PANEL
Cholesterol: 170 mg/dL (ref 0–200)
HDL: 80 mg/dL (ref >39.00)
LDL Cholesterol: 81 mg/dL (ref 0–99)
NonHDL: 89.65
Total CHOL/HDL Ratio: 2
Triglycerides: 44 mg/dL (ref 0.0–149.0)
VLDL: 8.8 mg/dL (ref 0.0–40.0)

## 2018-07-12 LAB — CBC WITH DIFFERENTIAL/PLATELET
Basophils Absolute: 0 10*3/uL (ref 0.0–0.1)
Basophils Relative: 0.8 % (ref 0.0–3.0)
Eosinophils Absolute: 0.1 10*3/uL (ref 0.0–0.7)
Eosinophils Relative: 2.7 % (ref 0.0–5.0)
HCT: 38.2 % (ref 36.0–46.0)
Hemoglobin: 12.8 g/dL (ref 12.0–15.0)
Lymphocytes Relative: 36.6 % (ref 12.0–46.0)
Lymphs Abs: 1.5 10*3/uL (ref 0.7–4.0)
MCHC: 33.5 g/dL (ref 30.0–36.0)
MCV: 92.8 fl (ref 78.0–100.0)
Monocytes Absolute: 0.4 10*3/uL (ref 0.1–1.0)
Monocytes Relative: 8.8 % (ref 3.0–12.0)
Neutro Abs: 2.1 10*3/uL (ref 1.4–7.7)
Neutrophils Relative %: 51.1 % (ref 43.0–77.0)
Platelets: 177 10*3/uL (ref 150.0–400.0)
RBC: 4.12 Mil/uL (ref 3.87–5.11)
RDW: 13.9 % (ref 11.5–15.5)
WBC: 4.1 10*3/uL (ref 4.0–10.5)

## 2018-07-12 LAB — TSH: TSH: 0.45 u[IU]/mL (ref 0.35–4.50)

## 2018-07-12 LAB — COMPREHENSIVE METABOLIC PANEL
ALT: 16 U/L (ref 0–35)
AST: 19 U/L (ref 0–37)
Albumin: 4.1 g/dL (ref 3.5–5.2)
Alkaline Phosphatase: 71 U/L (ref 39–117)
BUN: 13 mg/dL (ref 6–23)
CO2: 29 mEq/L (ref 19–32)
Calcium: 9.3 mg/dL (ref 8.4–10.5)
Chloride: 104 mEq/L (ref 96–112)
Creatinine, Ser: 0.76 mg/dL (ref 0.40–1.20)
GFR: 97.08 mL/min (ref >60.00)
Glucose, Bld: 91 mg/dL (ref 70–99)
Potassium: 4.3 mEq/L (ref 3.5–5.1)
Sodium: 140 mEq/L (ref 135–145)
Total Bilirubin: 1.4 mg/dL — ABNORMAL HIGH (ref 0.2–1.2)
Total Protein: 7.7 g/dL (ref 6.0–8.3)

## 2018-07-12 MED ORDER — CYCLOBENZAPRINE HCL 10 MG PO TABS: 5.0000 mg | ORAL_TABLET | Freq: Every evening | ORAL | 1 refills | Status: DC | PRN

## 2018-07-12 MED ORDER — MELOXICAM 15 MG PO TABS: 15.0000 mg | ORAL_TABLET | Freq: Every day | ORAL | 0 refills | Status: DC | PRN

## 2018-07-12 MED ORDER — ATORVASTATIN CALCIUM 10 MG PO TABS: 10.0000 mg | ORAL_TABLET | Freq: Every day | ORAL | 1 refills | Status: DC

## 2018-07-12 MED FILL — MELOXICAM 15 MG TABLET: 15 | 90 days supply | Qty: 90 | Fill #0

## 2018-07-12 MED FILL — CYCLOBENZAPRINE HCL 10 MG T: 10 | 30 days supply | Qty: 30 | Fill #0

## 2018-07-12 NOTE — Assessment & Plan Note (Signed)
Discussed referral to hand ortho She declined referral at this time

## 2018-07-12 NOTE — Assessment & Plan Note (Signed)
encouraged weight loss

## 2018-07-12 NOTE — Assessment & Plan Note (Signed)
Taking propranolol once a day Sees neuro annually Migraines controlled Continue propranolol

## 2018-07-12 NOTE — Assessment & Plan Note (Signed)
Check lipid panel,cmp ,tsh Continue daily statin Regular exercise and healthy diet encouraged  

## 2018-07-12 NOTE — Assessment & Plan Note (Addendum)
Intermittent Take meloxicam and flexeril as needed Encouraged not to take daily - discussed potential side effects Can take tylenol Can otc anti-inflammatories Continue regular walking Can increase stretching, try yoga

## 2018-07-13 LAB — HIV ANTIBODY (ROUTINE TESTING W REFLEX): HIV 1&2 Ab, 4th Generation: NONREACTIVE

## 2018-07-15 ENCOUNTER — Encounter: Payer: Self-pay | Admitting: Internal Medicine

## 2018-07-18 ENCOUNTER — Encounter: Payer: Self-pay | Admitting: Internal Medicine

## 2018-07-19 ENCOUNTER — Other Ambulatory Visit: Payer: Self-pay

## 2018-07-19 ENCOUNTER — Emergency Department (HOSPITAL_COMMUNITY): Payer: PRIVATE HEALTH INSURANCE

## 2018-07-19 ENCOUNTER — Encounter (HOSPITAL_COMMUNITY): Payer: Self-pay | Admitting: Emergency Medicine

## 2018-07-19 DIAGNOSIS — W010XXA Fall on same level from slipping, tripping and stumbling without subsequent striking against object, initial encounter: Secondary | ICD-10-CM | POA: Insufficient documentation

## 2018-07-19 DIAGNOSIS — S99922A Unspecified injury of left foot, initial encounter: Secondary | ICD-10-CM | POA: Diagnosis present

## 2018-07-19 DIAGNOSIS — Y990 Civilian activity done for income or pay: Secondary | ICD-10-CM | POA: Diagnosis not present

## 2018-07-19 DIAGNOSIS — Y929 Unspecified place or not applicable: Secondary | ICD-10-CM | POA: Diagnosis not present

## 2018-07-19 DIAGNOSIS — Y9301 Activity, walking, marching and hiking: Secondary | ICD-10-CM | POA: Diagnosis not present

## 2018-07-19 DIAGNOSIS — S92215A Nondisplaced fracture of cuboid bone of left foot, initial encounter for closed fracture: Secondary | ICD-10-CM | POA: Insufficient documentation

## 2018-07-19 DIAGNOSIS — Z79899 Other long term (current) drug therapy: Secondary | ICD-10-CM | POA: Diagnosis not present

## 2018-07-19 NOTE — ED Triage Notes (Signed)
Pt reports falling at appx 2145 while at work and injuring left foot.

## 2018-07-20 ENCOUNTER — Emergency Department (HOSPITAL_COMMUNITY)
Admission: EM | Admit: 2018-07-20 | Discharge: 2018-07-20 | Disposition: A | Discharge status: Home / Self Care | Payer: PRIVATE HEALTH INSURANCE | Attending: Emergency Medicine | Admitting: Emergency Medicine

## 2018-07-20 DIAGNOSIS — S92215A Nondisplaced fracture of cuboid bone of left foot, initial encounter for closed fracture: Secondary | ICD-10-CM

## 2018-07-20 MED ORDER — IBUPROFEN 800 MG PO TABS: 800.0000 mg | ORAL_TABLET | Freq: Three times a day (TID) | ORAL | 0 refills | Status: DC

## 2018-07-20 NOTE — ED Provider Notes (Signed)
[facility] DEPT Provider Note   CSN: [id number] Arrival date & time: 07/19/18  2244     History   Chief Complaint Chief Complaint  Patient presents with  . Foot Pain    HPI Mary [name] is a 53 y.o. female.     Patient presents to the emergency department with a chief complaint of left foot pain.  She states that she slipped and fell while at work.  She complains of pain to her midfoot.  She has been able to ambulate, though it is painful.  She has not taken anything for her symptoms.  Denies any numbness or weakness.  States that she also has some muscle soreness in her back.  She attributes this to the fall.  The history is provided by the patient. No language interpreter was used.    Past Medical History:  Diagnosis Date  . Headache(784.0)   . Hyperlipidemia     Patient Active Problem List   Diagnosis Date Noted  . Trigger finger, left middle finger 07/12/2018  . Common migraine 06/07/2017  . Chronic lower back pain 12/21/2015  . Postmenopausal 05/15/2015  . Hyperlipidemia 11/17/2014  . 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 Obesity 11/17/2014  . Knee pain, right 04/09/2013  . Constipation 07/08/2009    Past Surgical History:  Procedure Laterality Date  . CESAREAN SECTION       OB History   No obstetric history on file.      Home Medications    Prior to Admission medications   Medication Sig Start Date End Date Taking? Authorizing Provider  Aspirin-Acetaminophen-Caffeine (EXCEDRIN MIGRAINE PO) Take by mouth as needed.    [provider]  atorvastatin (LIPITOR) 10 MG tablet Take 1 tablet (10 mg total) by mouth daily. 07/12/18   Binnie Rail, MD  Calcium Carbonate-Vitamin D (CALCIUM 600+D3) 600-400 MG-UNIT per tablet Take 1 tablet by mouth daily.    [provider]  cyclobenzaprine (FLEXERIL) 10 MG tablet Take 0.5-1 tablets (5-10 mg total) by mouth at bedtime as needed for muscle spasms. 07/12/18   Binnie Rail,  MD  ibuprofen (ADVIL) 800 MG tablet Take 1 tablet (800 mg total) by mouth 3 (three) times daily. 07/20/18   Montine Circle, PA-C  meloxicam (MOBIC) 15 MG tablet Take 1 tablet (15 mg total) by mouth daily as needed. 07/12/18   Binnie Rail, MD  Multiple Vitamin (MULTIVITAMIN) tablet Take 1 tablet by mouth daily.    [provider]  propranolol (INDERAL) 40 MG tablet Take 1 tablet (40 mg total) by mouth daily. 05/23/18   Suzzanne Cloud, NP    Family History Family History  Problem Relation Age of Onset  . Migraines Mother     Social History Social History   Tobacco Use  . Smoking status: Never Smoker  . Smokeless tobacco: Never Used  Substance Use Topics  . Alcohol use: No  . Drug use: No     Allergies   Seasonal ic [cholestatin]   Review of Systems Review of Systems  All other systems reviewed and are negative.    Physical Exam Updated Vital Signs BP (!) 153/82 (BP Location: Left Arm)   Pulse 69   Temp 98 F (36.7 C) (Oral)   Resp 17   Ht 5\' 6"  (1.676 m)   Wt 84 kg   SpO2 100%   BMI 29.89 kg/m   Physical Exam  Nursing note and vitals reviewed.  Constitutional: Pt appears well-developed and well-nourished. No  distress.  HENT:  Head: Normocephalic and atraumatic.  Eyes: Conjunctivae are normal.  Neck: Normal range of motion.  Cardiovascular: Normal rate, regular rhythm. Intact distal pulses.   Capillary refill < 3 sec.  Pulmonary/Chest: Effort normal and breath sounds normal.  Musculoskeletal:  Left foot Pt exhibits tender to palpation over the midfoot with mild swelling, no palpable deformity.   ROM: 5/5  Strength: 5/5 Neurological: Pt  is alert. Coordination normal.  Sensation: 5/5 Skin: Skin is warm and dry. Pt is not diaphoretic.  No evidence of open wound or skin tenting Psychiatric: Pt has a normal mood and affect.    ED Treatments / Results  Labs (all labs ordered are listed, but only abnormal results are displayed) Labs Reviewed  - No data to display  EKG None  Radiology Dg Foot Complete Left  Result Date: 07/20/2018 CLINICAL DATA:  Foot injury EXAM: LEFT FOOT - COMPLETE 3+ VIEW COMPARISON:  None. FINDINGS: No malalignment. Questionable acute cortical fracture lateral aspect of the cuboid bone. No subluxation. IMPRESSION: Possible acute cortical fracture of the lateral cuboid. Electronically Signed   By: Donavan Foil M.D.   On: 07/20/2018 00:13    Procedures Procedures (including critical care time)  Medications Ordered in ED Medications - No data to display   Initial Impression / Assessment and Plan / ED Course  I have reviewed the triage vital signs and the nursing notes.  Pertinent labs & imaging results that were available during my care of the patient were reviewed by me and considered in my medical decision making (see chart for details).       Patient presents with injury to left foot.  DDx includes, fracture, strain, or sprain.  Consultants: none  Plain films reveal possible cuboid fx.  Pt advised to follow up with PCP and/or orthopedics. Patient given cam walker and crutches while in ED, conservative therapy such as RICE recommended and discussed.   Patient will be discharged home & is agreeable with above plan. Returns precautions discussed. Pt appears safe for discharge.   Final Clinical Impressions(s) / ED Diagnoses   Final diagnoses:  Closed nondisplaced fracture of cuboid of left foot, initial encounter    ED Discharge Orders         Ordered    ibuprofen (ADVIL) 800 MG tablet  3 times daily     07/20/18 0035           Montine Circle, PA-C 16/60/63 0160    Delora Fuel, MD 10/93/23 [phone]

## 2018-07-20 NOTE — Discharge Instructions (Signed)
You will need to follow-up with your doctor and employee health.  Do not put weight on the foot if it is painful.  Use crutches if needed.  Keep the foot up and elevated.  Use ice several times per day to help with pain and swelling.

## 2018-07-20 NOTE — ED Notes (Signed)
Bed: WTR9 Expected date:  Expected time:  Means of arrival:  Comments: Hepa filter placed 2116. UV light at 2226

## 2018-07-23 MED FILL — MELOXICAM 15 MG TABLET: 15 | 90 days supply | Qty: 90 | Fill #0

## 2018-07-23 MED FILL — CYCLOBENZAPRINE HCL 10 MG T: 10 | 30 days supply | Qty: 30 | Fill #0

## 2018-07-24 ENCOUNTER — Other Ambulatory Visit: Payer: Self-pay

## 2018-07-24 ENCOUNTER — Ambulatory Visit: Payer: PRIVATE HEALTH INSURANCE

## 2018-07-24 ENCOUNTER — Inpatient Hospital Stay: Payer: 59 | Admitting: Internal Medicine

## 2018-07-24 ENCOUNTER — Other Ambulatory Visit: Payer: Self-pay | Admitting: Family Medicine

## 2018-07-24 DIAGNOSIS — M25532 Pain in left wrist: Secondary | ICD-10-CM

## 2018-07-29 NOTE — Patient Instructions (Addendum)
Start Tylenol three times a day.     Try taking advil only if needed.   Stop the meloxicam.     Increase the propranolol to 60 mg twice a day.    Monitor your BP at home.  Update me via mychart.     Follow up in 6 months

## 2018-07-29 NOTE — Progress Notes (Signed)
Subjective:    Patient ID: Mary [name], female    DOB: 28-Jul-1965, 53 y.o.   MRN: [id number]  HPI The patient is here for follow up of elevated BP.   On 7/2 she fell at work.  She she bruised her left hand and broke her left foot.  Since then her BP has been elevated.  She has not been eating more sodium.  She was taking advil daily, but is now taking meloxicam daily.     Hypertension: She is taking her propranolol once a day for migraines.  She is compliant with a low sodium diet.  She denies chest pain, palpitations, edema, shortness of breath and regular headaches. She does monitor her blood pressure at home - 148/89, 135/73, 132/87.     Medications and allergies reviewed with patient and updated if appropriate.  Patient Active Problem List   Diagnosis Date Noted  . Trigger finger, left middle finger 07/12/2018  . Common migraine 06/07/2017  . Chronic lower back pain 12/21/2015  . Postmenopausal 05/15/2015  . Hyperlipidemia 11/17/2014  . 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 Obesity 11/17/2014  . Knee pain, right 04/09/2013  . Constipation 07/08/2009    Current Outpatient Medications on File Prior to Visit  Medication Sig Dispense Refill  . Aspirin-Acetaminophen-Caffeine (EXCEDRIN MIGRAINE PO) Take by mouth as needed.    Marland Kitchen atorvastatin (LIPITOR) 10 MG tablet Take 1 tablet (10 mg total) by mouth daily. 90 tablet 1  . Calcium Carbonate-Vitamin D (CALCIUM 600+D3) 600-400 MG-UNIT per tablet Take 1 tablet by mouth daily.    . cyclobenzaprine (FLEXERIL) 10 MG tablet Take 0.5-1 tablets (5-10 mg total) by mouth at bedtime as needed for muscle spasms. 30 tablet 1  . ibuprofen (ADVIL) 800 MG tablet Take 1 tablet (800 mg total) by mouth 3 (three) times daily. 21 tablet 0  . meloxicam (MOBIC) 15 MG tablet Take 1 tablet (15 mg total) by mouth daily as needed. 90 tablet 0  . Multiple Vitamin (MULTIVITAMIN) tablet Take 1 tablet by mouth daily.    . propranolol (INDERAL) 40 MG tablet Take 1  tablet (40 mg total) by mouth daily. 180 tablet 1   No current facility-administered medications on file prior to visit.     Past Medical History:  Diagnosis Date  . Headache(784.0)   . Hyperlipidemia     Past Surgical History:  Procedure Laterality Date  . CESAREAN SECTION      Social History   Socioeconomic History  . Marital status: Widowed    Spouse name: [name]  . Number of children: 2  . Years of education: 32  . Highest education level: Not on file  Occupational History    Employer: [employer]  . Financial resource strain: Not on file  . Food insecurity    Worry: Not on file    Inability: Not on file  . Transportation needs    Medical: Not on file    Non-medical: Not on file  Tobacco Use  . Smoking status: Never Smoker  . Smokeless tobacco: Never Used  Substance and Sexual Activity  . Alcohol use: No  . Drug use: No  . Sexual activity: Yes  Lifestyle  . Physical activity    Days per week: Not on file    Minutes per session: Not on file  . Stress: Not on file  Relationships  . Social Herbalist on phone: Not on file    Gets together: Not on file  Attends religious service: Not on file    Active member of club or organization: Not on file    Attends meetings of clubs or organizations: Not on file    Relationship status: Not on file  Other Topics Concern  . Not on file  Social History Narrative   Patient is right handed, resides with husband([name]),two children in a home.      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 Migraines Mother     Review of Systems  Constitutional: Negative for fever.  Respiratory: Negative for shortness of breath.   Cardiovascular: Negative for chest pain, palpitations and leg swelling.  Neurological: Negative for dizziness, light-headedness and headaches.       Objective:   Vitals:   07/30/18 0910  BP: (!) 168/96  Pulse: (!) 54  Resp: 16   Temp: 98.8 F (37.1 C)  SpO2: 99%   BP Readings from Last 3 Encounters:  07/30/18 (!) 168/96  07/19/18 (!) 153/82  07/12/18 132/84   Wt Readings from Last 3 Encounters:  07/30/18 193 lb 12.8 oz (87.9 kg)  07/19/18 185 lb 3 oz (84 kg)  07/12/18 186 lb 1.9 oz (84.4 kg)   Body mass index is 31.28 kg/m.   Physical Exam    Constitutional: Appears well-developed and well-nourished. No distress.  HENT:  Head: Normocephalic and atraumatic.  Neck: Neck supple. No tracheal deviation present. No thyromegaly present.  No cervical lymphadenopathy Cardiovascular: Normal rate, regular rhythm and normal heart sounds.   No murmur heard. No carotid bruit .  No edema Pulmonary/Chest: Effort normal and breath sounds normal. No respiratory distress. No has no wheezes. No rales.  Skin: Skin is warm and dry. Not diaphoretic.  Psychiatric: Normal mood and affect. Behavior is normal.      Assessment & Plan:    See Problem List for Assessment and Plan of chronic medical problems.

## 2018-07-30 ENCOUNTER — Other Ambulatory Visit: Payer: Self-pay

## 2018-07-30 ENCOUNTER — Encounter: Payer: Self-pay | Admitting: Internal Medicine

## 2018-07-30 ENCOUNTER — Ambulatory Visit (INDEPENDENT_AMBULATORY_CARE_PROVIDER_SITE_OTHER): Payer: 59 | Admitting: Internal Medicine

## 2018-07-30 DIAGNOSIS — I10 Essential (primary) hypertension: Secondary | ICD-10-CM | POA: Insufficient documentation

## 2018-07-30 MED ORDER — PROPRANOLOL HCL 60 MG PO TABS: 60.0000 mg | ORAL_TABLET | Freq: Two times a day (BID) | ORAL | 5 refills | Status: DC

## 2018-07-30 MED FILL — PROPRANOLOL 60 MG TABLET: 60 | 30 days supply | Qty: 60 | Fill #0

## 2018-07-30 NOTE — Assessment & Plan Note (Signed)
BP has been elevated consistently recently which may be related to pain or Nsaids She has had a few borderline BP readings and occasionally elevated BP She is taking propranolol daily for migraine prevention and we will increase this to 60 mg and she will take it BID Monitor BP at home Limit sodium Stop meloxicam if possible, advil only when needed - tylenol preferred Discussed importance of exercise and weight loss for BP

## 2018-08-06 ENCOUNTER — Encounter: Payer: Self-pay | Admitting: Internal Medicine

## 2018-08-06 MED FILL — METHOCARBAMOL 500 MG TABS: 500 | 20 days supply | Qty: 60 | Fill #0

## 2018-08-06 MED FILL — DICLOFENAC SODIUM 1 % GEL: 1 | 17 days supply | Qty: 100 | Fill #0

## 2018-08-17 ENCOUNTER — Encounter: Payer: Self-pay | Admitting: Gastroenterology

## 2018-08-20 ENCOUNTER — Telehealth: Payer: Self-pay | Admitting: Internal Medicine

## 2018-08-20 ENCOUNTER — Ambulatory Visit (INDEPENDENT_AMBULATORY_CARE_PROVIDER_SITE_OTHER): Payer: 59 | Admitting: Family

## 2018-08-20 ENCOUNTER — Other Ambulatory Visit: Payer: Self-pay

## 2018-08-20 ENCOUNTER — Encounter: Payer: Self-pay | Admitting: Family

## 2018-08-20 VITALS — BP 128/78 | HR 60 | Temp 97.6°F | Ht 66.0 in | Wt 191.6 lb

## 2018-08-20 DIAGNOSIS — L237 Allergic contact dermatitis due to plants, except food: Secondary | ICD-10-CM

## 2018-08-20 MED ORDER — HYDROXYZINE HCL 10 MG PO TABS: 10.0000 mg | ORAL_TABLET | Freq: Three times a day (TID) | ORAL | 0 refills | Status: DC | PRN

## 2018-08-20 MED ORDER — FAMOTIDINE 20 MG PO TABS: 20.0000 mg | ORAL_TABLET | Freq: Two times a day (BID) | ORAL | 0 refills | Status: DC

## 2018-08-20 MED ORDER — METHYLPREDNISOLONE 4 MG PO TBPK: ORAL_TABLET | ORAL | 0 refills | Status: DC

## 2018-08-20 MED ORDER — METHYLPREDNISOLONE ACETATE 40 MG/ML IJ SUSP
40.0000 mg | Freq: Once | INTRAMUSCULAR | Status: AC
  Administered 2018-08-20: 40 mg via INTRAMUSCULAR

## 2018-08-20 MED FILL — FAMOTIDINE 20 MG TABS: 20 | 15 days supply | Qty: 30 | Fill #0

## 2018-08-20 MED FILL — hydrOXYzine HCL 10 MG TABS: 10 | 10 days supply | Qty: 30 | Fill #0

## 2018-08-20 MED FILL — METHYLPREDNISOLONE 4 MG TBP: 4 | 6 days supply | Qty: 21 | Fill #0

## 2018-08-20 NOTE — Progress Notes (Signed)
Mary [name] is a 53 y.o. female with the following history as recorded in [employer]:  Patient Active Problem List   Diagnosis Date Noted  . Hypertension 07/30/2018  . Trigger finger, left middle finger 07/12/2018  . Common migraine 06/07/2017  . Chronic lower back pain 12/21/2015  . Postmenopausal 05/15/2015  . Hyperlipidemia 11/17/2014  . 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 Obesity 11/17/2014  . Knee pain, right 04/09/2013  . Constipation 07/08/2009    Current Outpatient Medications  Medication Sig Dispense Refill  . Aspirin-Acetaminophen-Caffeine (EXCEDRIN MIGRAINE PO) Take by mouth as needed.    Marland Kitchen atorvastatin (LIPITOR) 10 MG tablet Take 1 tablet (10 mg total) by mouth daily. 90 tablet 1  . Calcium Carbonate-Vitamin D (CALCIUM 600+D3) 600-400 MG-UNIT per tablet Take 1 tablet by mouth daily.    . cyclobenzaprine (FLEXERIL) 10 MG tablet Take 0.5-1 tablets (5-10 mg total) by mouth at bedtime as needed for muscle spasms. 30 tablet 1  . diclofenac sodium (VOLTAREN) 1 % GEL     . ibuprofen (ADVIL) 800 MG tablet Take 1 tablet (800 mg total) by mouth 3 (three) times daily. 21 tablet 0  . meloxicam (MOBIC) 15 MG tablet Take 1 tablet (15 mg total) by mouth daily as needed. 90 tablet 0  . methocarbamol (ROBAXIN) 500 MG tablet     . Multiple Vitamin (MULTIVITAMIN) tablet Take 1 tablet by mouth daily.    . propranolol (INDERAL) 60 MG tablet Take 1 tablet (60 mg total) by mouth 2 (two) times daily. 60 tablet 5  . famotidine (PEPCID) 20 MG tablet Take 1 tablet (20 mg total) by mouth 2 (two) times daily. 30 tablet 0  . hydrOXYzine (ATARAX/VISTARIL) 10 MG tablet Take 1 tablet (10 mg total) by mouth 3 (three) times daily as needed. 30 tablet 0  . methylPREDNISolone (MEDROL DOSEPAK) 4 MG TBPK tablet Taper as directed 21 tablet 0   No current facility-administered medications for this visit.     Allergies: Seasonal ic [cholestatin]  Past Medical History:  Diagnosis Date  . Headache(784.0)   .  Hyperlipidemia     Past Surgical History:  Procedure Laterality Date  . CESAREAN SECTION      Family History  Problem Relation Age of Onset  . Migraines Mother     Social History   Tobacco Use  . Smoking status: Never Smoker  . Smokeless tobacco: Never Used  Substance Use Topics  . Alcohol use: No    Subjective:  Patient was doing yard work this past weekend and was exposed to poison ivy; complaining of itching/ rash on both of her arms; feels like rash has started to spread to her left hip/ abdomen; has been using OTC Benadryl and Calamine lotion with limited benefit;    Objective:  Vitals:   08/20/18 0949  BP: 128/78  Pulse: 60  Temp: 97.6 F (36.4 C)  TempSrc: Oral  SpO2: 99%  Weight: 191 lb 9.6 oz (86.9 kg)  Height: 5\' 6"  (1.676 m)    General: Well developed, well nourished, in no acute distress  Skin : Warm and dry. Vesicular lesions noted on both forearms; erythema over left hip Head: Normocephalic and atraumatic  Lungs: Respirations unlabored; speech intact; face symmetrical; moves all extremities well; CNII-XII intact without focal deficit   Assessment:  1. Allergic contact dermatitis due to plants, except food     Plan:  Depo-Medrol IM 40 mg given in office; start oral steroid pack tomorrow; Rx for Atarax 10 mg tid and Pepcid  20 mg bid; follow-up worse, no better.   No follow-ups on file.  No orders of the defined types were placed in this encounter.   Requested Prescriptions   Signed Prescriptions Disp Refills  . methylPREDNISolone (MEDROL DOSEPAK) 4 MG TBPK tablet 21 tablet 0    Sig: Taper as directed  . hydrOXYzine (ATARAX/VISTARIL) 10 MG tablet 30 tablet 0    Sig: Take 1 tablet (10 mg total) by mouth 3 (three) times daily as needed.  . famotidine (PEPCID) 20 MG tablet 30 tablet 0    Sig: Take 1 tablet (20 mg total) by mouth 2 (two) times daily.

## 2018-08-20 NOTE — Addendum Note (Signed)
Addended by: Marcina Millard on: 08/20/2018 10:39 AM   Modules accepted: Orders

## 2018-08-20 NOTE — Telephone Encounter (Signed)
Spoke with patient and info given to start does pack tomorrow.

## 2018-08-20 NOTE — Patient Instructions (Signed)
Poison Ivy Dermatitis Poison ivy dermatitis is redness and soreness of the skin caused by chemicals in the leaves of the poison ivy plant. You may have very bad itching, swelling, a rash, and blisters. What are the causes?  Touching a poison ivy plant.  Touching something that has the chemical on it. This may include animals or objects that have come in contact with the plant. What increases the risk?  Going outdoors often in wooded or marshy areas.  Going outdoors without wearing protective clothing, such as closed shoes, long pants, and a long-sleeved shirt. What are the signs or symptoms?   Skin redness.  Very bad itching.  A rash that often includes bumps and blisters. ? The rash usually appears 48 hours after exposure, if you have been exposed before. ? If this is the first time you have been exposed, the rash may not appear until a week after exposure.  Swelling. This may occur if the reaction is very bad. Symptoms usually last for 1-2 weeks. The first time you develop this condition, symptoms may last 3-4 weeks. How is this treated? This condition may be treated with:  Hydrocortisone cream or calamine lotion to relieve itching.  Oatmeal baths to soothe the skin.  Medicines, such as over-the-counter antihistamine tablets.  Oral steroid medicine for more severe reactions. Follow these instructions at home: Medicines  Take or apply over-the-counter and prescription medicines only as told by your doctor.  Use hydrocortisone cream or calamine lotion as needed to help with itching. General instructions  Do not scratch or rub your skin.  Put a cold, wet cloth (cold compress) on the affected areas or take baths in cool water. This will help with itching.  Avoid hot baths and showers.  Take oatmeal baths as needed. Use colloidal oatmeal. You can get this at a pharmacy or grocery store. Follow the instructions on the package.  While you have the rash, wash your clothes  right after you wear them.  Keep all follow-up visits as told by your health care provider. This is important. How is this prevented?   Know what poison ivy looks like, so you can avoid it. ? This plant has three leaves with flowering branches on a single stem. ? The leaves are glossy. ? The leaves have uneven edges that come to a point at the front.  If you touch poison ivy, wash your skin with soap and water right away. Be sure to wash under your fingernails.  When hiking or camping, wear long pants, a long-sleeved shirt, tall socks, and hiking boots. You can also use a lotion on your skin that helps to prevent contact with poison ivy.  If you think that your clothes or outdoor gear came in contact with poison ivy, rinse them off with a garden hose before you bring them inside your house.  When doing yard work or gardening, wear gloves, long sleeves, long pants, and boots. Wash your garden tools and gloves if they come in contact with poison ivy.  If you think that your pet has come into contact with poison ivy, wash him or her with pet shampoo and water. Make sure to wear gloves while washing your pet. Contact a doctor if:  You have open sores in the rash area.  You have more redness, swelling, or pain in the rash area.  You have redness that spreads beyond the rash area.  You have fluid, blood, or pus coming from the rash area.  You have a   fever.  You have a rash over a large area of your body.  You have a rash on your eyes, mouth, or genitals.  Your rash does not get better after a few weeks. Get help right away if:  Your face swells or your eyes swell shut.  You have trouble breathing.  You have trouble swallowing. These symptoms may be an emergency. Do not wait to see if the symptoms will go away. Get medical help right away. Call your local emergency services (911 in the [place] Do not drive yourself to the hospital. Summary  Poison ivy dermatitis is redness and  soreness of the skin caused by chemicals in the leaves of the poison ivy plant.  You may have skin redness, very bad itching, swelling, and a rash.  Do not scratch or rub your skin.  Take or apply over-the-counter and prescription medicines only as told by your doctor. This information is not intended to replace advice given to you by your health care provider. Make sure you discuss any questions you have with your health care provider. Document Released: 02/05/2010 Document Revised: 04/27/2018 Document Reviewed: 12/29/2017 Elsevier Patient Education  2020 [employer]  

## 2018-08-20 NOTE — Telephone Encounter (Signed)
Pt would like a call from Jodi Mourning or nurse to find out which meds she should take today from the ones that were sent to the pharmacy and which ones she should start tomorrow/ please advise

## 2018-08-22 ENCOUNTER — Other Ambulatory Visit: Payer: Self-pay

## 2018-08-22 ENCOUNTER — Ambulatory Visit (AMBULATORY_SURGERY_CENTER): Payer: 59 | Admitting: *Deleted

## 2018-08-22 VITALS — Temp 96.9°F | Ht 66.0 in | Wt 190.0 lb

## 2018-08-22 DIAGNOSIS — Z1211 Encounter for screening for malignant neoplasm of colon: Secondary | ICD-10-CM

## 2018-08-22 MED ORDER — NA SULFATE-K SULFATE-MG SULF 17.5-3.13-1.6 GM/177ML PO SOLN: ORAL | 0 refills | Status: DC

## 2018-08-22 MED FILL — SUPREP BOWEL PREP KIT: 17.5-3.13-1 | 2 days supply | Qty: 354 | Fill #0

## 2018-08-22 MED FILL — ATORVASTATIN 10 MG TABLET: 10 | 90 days supply | Qty: 90 | Fill #3

## 2018-08-22 NOTE — Progress Notes (Signed)
Patient is here for PV along with her daughter. Patient denies any allergies to eggs or soy. Patient denies any problems with anesthesia/sedation. Patient denies any oxygen use at home. Patient denies taking any diet/weight loss medications or blood thinners. EMMI education assisgned to patient on colonoscopy, this was explained and instructions given to patient.Pt is aware that care partner will wait in the car during procedure; if they feel like they will be too hot to wait in the car; they may wait in the lobby.  We want them to wear a mask (we do not have any that we can provide them), practice social distancing, and we will check their temperatures when they get here.  I did remind patient that their care partner needs to stay in the parking lot the entire time. Pt will wear mask into building.

## 2018-08-28 ENCOUNTER — Telehealth: Payer: Self-pay | Admitting: Gastroenterology

## 2018-08-28 NOTE — Telephone Encounter (Signed)

## 2018-08-29 ENCOUNTER — Encounter: Payer: Self-pay | Admitting: Gastroenterology

## 2018-08-29 ENCOUNTER — Ambulatory Visit (AMBULATORY_SURGERY_CENTER): Payer: 59 | Admitting: Gastroenterology

## 2018-08-29 ENCOUNTER — Other Ambulatory Visit: Payer: Self-pay

## 2018-08-29 VITALS — BP 119/82 | HR 54 | Temp 98.4°F | Resp 20 | Ht 66.0 in | Wt 191.0 lb

## 2018-08-29 DIAGNOSIS — D122 Benign neoplasm of ascending colon: Secondary | ICD-10-CM | POA: Diagnosis not present

## 2018-08-29 DIAGNOSIS — E7800 Pure hypercholesterolemia, unspecified: Secondary | ICD-10-CM | POA: Diagnosis not present

## 2018-08-29 DIAGNOSIS — Z1211 Encounter for screening for malignant neoplasm of colon: Secondary | ICD-10-CM

## 2018-08-29 MED ORDER — SODIUM CHLORIDE 0.9 % IV SOLN: 500.0000 mL | Freq: Once | INTRAVENOUS | Status: DC

## 2018-08-29 NOTE — Progress Notes (Signed)
Report given to PACU, vss 

## 2018-08-29 NOTE — Progress Notes (Signed)
Called to room to assist during endoscopic procedure.  Patient ID and intended procedure confirmed with present staff. Received instructions for my participation in the procedure from the performing physician.  

## 2018-08-29 NOTE — Patient Instructions (Signed)
Read all handouts given to you by your recovery room nurse.  Thank-you for choosing us for your healthcare needs today.  YOU HAD AN ENDOSCOPIC PROCEDURE TODAY AT THE [facility]:   Refer to the procedure report that was given to you for any specific questions about what was found during the examination.  If the procedure report does not answer your questions, please call your gastroenterologist to clarify.  If you requested that your care partner not be given the details of your procedure findings, then the procedure report has been included in a sealed envelope for you to review at your convenience later.  YOU SHOULD EXPECT: Some feelings of bloating in the abdomen. Passage of more gas than usual.  Walking can help get rid of the air that was put into your GI tract during the procedure and reduce the bloating. If you had a lower endoscopy (such as a colonoscopy or flexible sigmoidoscopy) you may notice spotting of blood in your stool or on the toilet paper. If you underwent a bowel prep for your procedure, you may not have a normal bowel movement for a few days.  Please Note:  You might notice some irritation and congestion in your nose or some drainage.  This is from the oxygen used during your procedure.  There is no need for concern and it should clear up in a day or so.  SYMPTOMS TO REPORT IMMEDIATELY:   Following lower endoscopy (colonoscopy or flexible sigmoidoscopy):  Excessive amounts of blood in the stool  Significant tenderness or worsening of abdominal pains  Swelling of the abdomen that is new, acute  Fever of 100F or higher   For urgent or emergent issues, a gastroenterologist can be reached at any hour by calling [phone].   DIET:  We do recommend a small meal at first, but then you may proceed to your regular diet.  Drink plenty of fluids but you should avoid alcoholic beverages for 24 hours.  ACTIVITY:  You should plan to take it easy for the rest of today  and you should NOT DRIVE or use heavy machinery until tomorrow (because of the sedation medicines used during the test).    FOLLOW UP: Our staff will call the number listed on your records 48-72 hours following your procedure to check on you and address any questions or concerns that you may have regarding the information given to you following your procedure. If we do not reach you, we will leave a message.  We will attempt to reach you two times.  During this call, we will ask if you have developed any symptoms of COVID 19. If you develop any symptoms (ie: fever, flu-like symptoms, shortness of breath, cough etc.) before then, please call [phone].  If you test positive for Covid 19 in the 2 weeks post procedure, please call and report this information to us.    If any biopsies were taken you will be contacted by phone or by letter within the next 1-3 weeks.  Please call us at [phone] if you have not heard about the biopsies in 3 weeks.    SIGNATURES/CONFIDENTIALITY: You and/or your care partner have signed paperwork which will be entered into your electronic medical record.  These signatures attest to the fact that that the information above on your After Visit Summary has been reviewed and is understood.  Full responsibility of the confidentiality of this discharge information lies with you and/or your care-partner. 

## 2018-08-29 NOTE — Progress Notes (Signed)
Pt's states no medical or surgical changes since previsit or office visit.  CW vitals and JB temps.

## 2018-08-29 NOTE — Op Note (Signed)
[place] Patient Name: Mary [name] Procedure Date: 08/29/2018 9:32 AM MRN: [id number] Endoscopist: [place]. Loletha Carrow , MD Age: 53 Referring MD:  Date of Birth: 1965-04-08 Gender: Female Account #: [id number] Procedure:                Colonoscopy Indications:              Screening for colorectal malignant neoplasm, This                            is the patient's first colonoscopy Medicines:                Monitored Anesthesia Care Procedure:                Pre-Anesthesia Assessment:                           - Prior to the procedure, a History and Physical                            was performed, and patient medications and                            allergies were reviewed. The patient's tolerance of                            previous anesthesia was also reviewed. The risks                            and benefits of the procedure and the sedation                            options and risks were discussed with the patient.                            All questions were answered, and informed consent                            was obtained. Prior Anticoagulants: The patient has                            taken no previous anticoagulant or antiplatelet                            agents. ASA Grade Assessment: II - A patient with                            mild systemic disease. After reviewing the risks                            and benefits, the patient was deemed in                            satisfactory condition to undergo the procedure.  After obtaining informed consent, the colonoscope                            was passed under direct vision. Throughout the                            procedure, the patient's blood pressure, pulse, and                            oxygen saturations were monitored continuously. The                            Colonoscope was introduced through the anus and                            advanced to the the  terminal ileum, with                            identification of the appendiceal orifice and IC                            valve. The colonoscopy was performed without                            difficulty. The patient tolerated the procedure                            well. The quality of the bowel preparation was good                            in the right and transverse, fair in the left (food                            debris and seeds). The terminal ileum, ileocecal                            valve, appendiceal orifice, and rectum were                            photographed. Scope In: 9:42:45 AM Scope Out: 10:05:30 AM Scope Withdrawal Time: 0 hours 20 minutes 7 seconds  Total Procedure Duration: 0 hours 22 minutes 45 seconds  Findings:                 The perianal and digital rectal examinations were                            normal.                           The terminal ileum appeared normal.                           A diminutive polyp was found in the ascending  colon. The polyp was sessile. The polyp was removed                            with a cold snare. Resection and retrieval were                            complete.                           The exam was otherwise without abnormality on                            direct and retroflexion views. Complications:            No immediate complications. Estimated Blood Loss:     Estimated blood loss was minimal. Impression:               - The examined portion of the ileum was normal.                           - One diminutive polyp in the ascending colon,                            removed with a cold snare. Resected and retrieved.                           - The examination was otherwise normal on direct                            and retroflexion views. Recommendation:           - Patient has a contact number available for                            emergencies. The signs and symptoms of  potential                            delayed complications were discussed with the                            patient. Return to normal activities tomorrow.                            Written discharge instructions were provided to the                            patient.                           - Resume previous diet.                           - Continue present medications.                           - Await pathology results.                           -  Repeat colonoscopy is recommended for                            surveillance. The colonoscopy date will be                            determined after pathology results from today's                            exam become available for review. For that exam,                            consume additional water with prep and closer                            attention to pre-procedure dietary restrictions                            (see prep description above) Mallie Mussel L. Loletha Carrow, MD 08/29/2018 10:08:58 AM This report has been signed electronically.

## 2018-08-31 ENCOUNTER — Encounter: Payer: Self-pay | Admitting: Gastroenterology

## 2018-08-31 ENCOUNTER — Telehealth: Payer: Self-pay

## 2018-08-31 MED FILL — METHOCARBAMOL 500 MG TABS: 500 | 20 days supply | Qty: 60 | Fill #0

## 2018-08-31 MED FILL — DICLOFENAC SODIUM 1 % GEL: 1 | 17 days supply | Qty: 100 | Fill #0

## 2018-08-31 NOTE — Telephone Encounter (Signed)
Covid-19 screening questions   Do you now or have you had a fever in the last 14 days? No.  Do you have any respiratory symptoms of shortness of breath or cough now or in the last 14 days? No.  Do you have any family members or close contacts with diagnosed or suspected Covid-19 in the past 14 days? No.  Have you been tested for Covid-19 and found to be positive? No.       Follow up Call-  Call back number 08/29/2018  Post procedure Call Back phone  # [phone]  Permission to leave phone message Yes  Some recent data might be hidden     Patient questions:  Do you have a fever, pain , or abdominal swelling? No. Pain Score  0 *  Have you tolerated food without any problems? Yes.    Have you been able to return to your normal activities? Yes.    Do you have any questions about your discharge instructions: Diet   No. Medications  No. Follow up visit  No.  Do you have questions or concerns about your Care? No.  Actions: * If pain score is 4 or above: No action needed, pain <4.

## 2018-09-06 ENCOUNTER — Encounter: Payer: Self-pay | Admitting: Internal Medicine

## 2018-09-18 ENCOUNTER — Ambulatory Visit: Payer: PRIVATE HEALTH INSURANCE | Attending: Orthopaedic Surgery | Admitting: Physical Therapy

## 2018-09-18 ENCOUNTER — Other Ambulatory Visit: Payer: Self-pay

## 2018-09-18 DIAGNOSIS — M6281 Muscle weakness (generalized): Secondary | ICD-10-CM | POA: Diagnosis present

## 2018-09-18 DIAGNOSIS — M25672 Stiffness of left ankle, not elsewhere classified: Secondary | ICD-10-CM | POA: Diagnosis present

## 2018-09-18 DIAGNOSIS — R2689 Other abnormalities of gait and mobility: Secondary | ICD-10-CM | POA: Insufficient documentation

## 2018-09-18 DIAGNOSIS — M25572 Pain in left ankle and joints of left foot: Secondary | ICD-10-CM | POA: Insufficient documentation

## 2018-09-18 NOTE — Therapy (Signed)
[facility] [address]  [place], [place], [address] Phone: [phone]   Fax:  [phone]  Physical Therapy Evaluation  Patient Details  Name: Mary [name] MRN: [id number] Date of Birth: [birth year] Referring Provider (PT): Radene Journey, MD   Encounter Date: 09/18/2018  PT End of Session - 09/18/18 1315    Visit Number  1    Number of Visits  9    Date for PT Re-Evaluation  10/23/18    Authorization Type  WC - 1 eval & 8 treatment visits    Authorization - Visit Number  1    Authorization - Number of Visits  9    PT Start Time  [id number]    PT Stop Time  1411    PT Time Calculation (min)  56 min    Activity Tolerance  Patient tolerated treatment well    Behavior During Therapy  [facility] for tasks assessed/performed       Past Medical History:  Diagnosis Date  . Allergy   . Arthritis   . Headache(784.0)   . Hyperlipidemia     Past Surgical History:  Procedure Laterality Date  . CESAREAN SECTION      There were no vitals filed for this visit.   Subjective Assessment - 09/18/18 1318    Subjective  Pt works in Water engineer at [employer]. Reports she fell at work while carrying trash to [employer], suffering a L foot/ankle sprain as well as trauma to her L wrist. Was out of work for ~6 weeks and when tried to go back she was in too much pain. Pain limits her from wearing closed shoe.    Limitations  Standing;Walking    How long can you stand comfortably?  45 minutes    How long can you walk comfortably?  45 minutes    Patient Stated Goals  "be able to work normal shift (8 hrs) w/o pain"    Currently in Pain?  No/denies    Pain Score  0-No pain   up to 6/10 on average   Pain Location  Foot    Pain Orientation  Left   primarily plantar surface   Pain Descriptors / Indicators  Sharp    Pain Type  Acute pain    Pain Radiating Towards  into L ankle and calf    Pain Onset  More than a month ago    July 2nd   Pain Frequency  Intermittent    Aggravating Factors   prolonged standing or walking    Pain Relieving Factors  ice water soak, warm water with epsom salts in the evening    Effect of Pain on Daily Activities  unable to return to work, limited standing tolerance, difficulty with stairs         [facility] PT Assessment - 09/18/18 1315      Assessment   Medical Diagnosis  L foot & ankle sprain    Referring Provider (PT)  Radene Journey, MD    Onset Date/Surgical Date  07/19/18    Hand Dominance  Right    Next MD Visit  upon completion of PT    Prior Therapy  none      Balance Screen   Has the patient fallen in the past 6 months  Yes    How many times?  1 - at time of injury    Has the patient had a decrease in activity level because of a  fear of falling?   No    Is the patient reluctant to leave their home because of a fear of falling?   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; walking daily (every morning) prior to injury      Cognition   Overall Cognitive Status  Within Functional Limits for tasks assessed      Observation/Other Assessments   Focus on Therapeutic Outcomes (FOTO)   Ankle - 28% (72% limitation); Predicted 52% (48% limitation) in 16 visits      ROM / Strength   AROM / PROM / Strength  AROM;Strength      AROM   AROM Assessment Site  Ankle    Right/Left Ankle  Right;Left    Right Ankle Dorsiflexion  8    Right Ankle Plantar Flexion  30    Right Ankle Inversion  20    Right Ankle Eversion  12    Left Ankle Dorsiflexion  0    Left Ankle Plantar Flexion  21    Left Ankle Inversion  10    Left Ankle Eversion  7      Strength    Strength Assessment Site  Hip;Knee;Ankle    Right/Left Hip  Right;Left    Right Hip Flexion  4+/5    Right Hip Extension  4-/5    Right Hip External Rotation   4+/5    Right Hip Internal Rotation  5/5    Right Hip ABduction  4/5    Right Hip ADduction  4/5    Left Hip Flexion  4-/5    Left Hip Extension  3/5    Left Hip External Rotation  3+/5    Left Hip Internal Rotation  4/5    Left Hip ABduction  3/5    Left Hip ADduction  3-/5    Right/Left Knee  Right;Left    Right Knee Flexion  4/5    Right Knee Extension  4+/5    Left Knee Flexion  4-/5    Left Knee Extension  4-/5    Right/Left Ankle  Right;Left    Right Ankle Dorsiflexion  4+/5    Right Ankle Plantar Flexion  4+/5    Right Ankle Inversion  4/5    Right Ankle Eversion  4/5    Left Ankle Dorsiflexion  4-/5    Left Ankle Plantar Flexion  3/5   lmited lift with pain; 4/5 NWB with pain on resistance   Left Ankle Inversion  3/5   pain with attemtped resistance   Left Ankle Eversion  3/5      Flexibility   Soft Tissue Assessment /Muscle Length  yes    Hamstrings  WFL    Quadriceps  WFL    ITB  WFL    Piriformis  mild/mod tight B      Ambulation/Gait   Assistive device  None    Gait Pattern  Antalgic;Step-through pattern;Decreased weight shift to left;Decreased stance time - left  Objective measurements completed on examination: See above findings.      [facility] Adult PT Treatment/Exercise - 09/18/18 1315      Exercises   Exercises  Ankle      Ankle Exercises: Stretches   Plantar Fascia Stretch  30 seconds;1 rep    Plantar Fascia Stretch Limitations  long-sitting with towel    Soleus Stretch  30 seconds;1 rep    Soleus Stretch Limitations  long-sitting with towel    Gastroc Stretch  30 seconds;1 rep    Gastroc Stretch Limitations  long-sitting with towel      Ankle Exercises: Seated   Ankle Circles/Pumps  Left;10 reps;AROM    Ankle Circles/Pumps Limitations  APs & CW/CCW circles              PT Education - 09/18/18 1418    Education Details  PT eval findings, anticipated POC & initial HEP    Person(s) Educated  Patient    Methods  Explanation;Demonstration;Verbal cues;Tactile cues;Handout    Comprehension  Verbalized understanding;Returned demonstration;Verbal cues required;Tactile cues required;Need further instruction       PT Short Term Goals - 09/18/18 1411      PT SHORT TERM GOAL #1   Title  Independent with initial HEP    Status  New    Target Date  10/02/18        PT Long Term Goals - 09/18/18 1411      PT LONG TERM GOAL #1   Title  Independent with ongoing/advanced HEP    Status  New    Target Date  10/23/18      PT LONG TERM GOAL #2   Title  L ankle AROM WFL and essentially equilavent to L to allow for normal gait pattern    Status  New    Target Date  10/23/18      PT LONG TERM GOAL #3   Title  L LE strength >/= 4/5 for improved stability    Status  New    Target Date  10/23/18      PT LONG TERM GOAL #4   Title  Patient will ambulate with normal gait pattern w/o L foot/ankle pain    Status  New    Target Date  10/23/18      PT LONG TERM GOAL #5   Title  Patient will report no limitaiton in standing or walking tolerance due to L foot or ankle pain to allow for return to work fullt-time    Status  New    Target Date  10/23/18             Plan - 09/18/18 1411    Clinical Impression Statement  Mary [name] is a 53 y/o female who presents to OP PT for a worker's comp injury consisting of a L foot and ankle sprain suffered during 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 She reports she felt that she was recovering well but when she tried to return to work, she was unable to due to L foot and ankle pain. She presents today in thong style sandals with antalgic gait pattern with decreased weight shift to L and reports she is unable to wear closed shoe (such as athletic shoe) due to  pain from pressure of laces on dorsum of her foot. She demonstrates limited ankle AROM bilaterally but much more pronounced on L than R. Mild to moderate weakness evident throughout L LE as compared to  R, most significantly in ankle and proximally at hip. Mary [name] will benefit from skilled PT to restore functional ROM and strength in L ankle for improved walking and activity tolerance to allow return to work full-time.    Personal Factors and Comorbidities  Profession;Comorbidity 2    Comorbidities  chronic LBP, HTN    Examination-Activity Limitations  Stand;Locomotion Level    Examination-Participation Restrictions  Cleaning    Stability/Clinical Decision Making  Stable/Uncomplicated    Clinical Decision Making  Low    Rehab Potential  Good    PT Frequency  2x / week    PT Duration  4 weeks    PT Treatment/Interventions  ADLs/Self Care Home Management;Cryotherapy;Electrical Stimulation;Iontophoresis 4mg /ml Dexamethasone;Moist Heat;Ultrasound;Gait training;Stair training;Functional mobility training;Therapeutic activities;Therapeutic exercise;Balance training;Neuromuscular re-education;Patient/family education;Manual techniques;Passive range of motion;Dry needling;Taping;Vasopneumatic Device;Joint Manipulations    PT Next Visit Plan  Review initial HEP; L ankle ROM and strengthening; gait training    Consulted and Agree with Plan of Care  Patient       Patient will benefit from skilled therapeutic intervention in order to improve the following deficits and impairments:  Abnormal gait, Decreased activity tolerance, Decreased balance, Decreased endurance, Decreased mobility, Decreased range of motion, Decreased strength, Difficulty walking, Hypomobility, Increased muscle spasms, Impaired perceived functional ability, Impaired flexibility, Impaired sensation, Improper body mechanics, Pain  Visit Diagnosis: Pain in left ankle and joints of left foot  Stiffness of left ankle, not elsewhere  classified  Other abnormalities of gait and mobility  Muscle weakness (generalized)     Problem List Patient Active Problem List   Diagnosis Date Noted  . Hypertension 07/30/2018  . Trigger finger, left middle finger 07/12/2018  . Common migraine 06/07/2017  . Chronic lower back pain 12/21/2015  . Postmenopausal 05/15/2015  . Hyperlipidemia 11/17/2014  . 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 Obesity 11/17/2014  . Knee pain, right 04/09/2013  . Constipation 07/08/2009    Percival Spanish, PT, MPT 09/18/2018, 8:03 PM  [facility] [address]  [place], [place], [address] Phone: [phone]   Fax:  [phone]  Name: Mary [name] MRN: [id number] Date of Birth: Jun 06, 1965

## 2018-09-20 MED FILL — PROPRANOLOL 60 MG TABLET: 60 | 30 days supply | Qty: 60 | Fill #1

## 2018-09-21 DIAGNOSIS — M25672 Stiffness of left ankle, not elsewhere classified: Secondary | ICD-10-CM | POA: Diagnosis present

## 2018-09-21 DIAGNOSIS — R2689 Other abnormalities of gait and mobility: Secondary | ICD-10-CM | POA: Diagnosis present

## 2018-09-21 DIAGNOSIS — M6281 Muscle weakness (generalized): Secondary | ICD-10-CM | POA: Diagnosis present

## 2018-09-21 DIAGNOSIS — M25572 Pain in left ankle and joints of left foot: Secondary | ICD-10-CM | POA: Diagnosis present

## 2018-09-26 ENCOUNTER — Other Ambulatory Visit: Payer: Self-pay | Admitting: Internal Medicine

## 2018-09-27 ENCOUNTER — Ambulatory Visit: Payer: PRIVATE HEALTH INSURANCE | Admitting: Physical Therapy

## 2018-09-27 ENCOUNTER — Other Ambulatory Visit: Payer: Self-pay

## 2018-09-27 DIAGNOSIS — M25572 Pain in left ankle and joints of left foot: Secondary | ICD-10-CM | POA: Diagnosis not present

## 2018-09-27 DIAGNOSIS — M6281 Muscle weakness (generalized): Secondary | ICD-10-CM

## 2018-09-27 DIAGNOSIS — R2689 Other abnormalities of gait and mobility: Secondary | ICD-10-CM

## 2018-09-27 DIAGNOSIS — M25672 Stiffness of left ankle, not elsewhere classified: Secondary | ICD-10-CM

## 2018-09-27 NOTE — Therapy (Signed)
[facility] [address]  [place], [place], [address] Phone: [phone]   Fax:  [phone]  Physical Therapy Treatment  Patient Details  Name: Mary [name] MRN: [id number] Date of Birth: 1965/12/20 Referring Provider (PT): Radene Journey, MD   Encounter Date: 09/27/2018  PT End of Session - 09/27/18 0846    Visit Number  2    Number of Visits  9    Date for PT Re-Evaluation  10/23/18    Authorization Type  WC - 1 eval & 8 treatment visits    Authorization - Visit Number  2    Authorization - Number of Visits  9    PT Start Time  0846    PT Stop Time  0933    PT Time Calculation (min)  47 min    Activity Tolerance  Patient tolerated treatment well    Behavior During Therapy  [facility] for tasks assessed/performed       Past Medical History:  Diagnosis Date  . Allergy   . Arthritis   . Headache(784.0)   . Hyperlipidemia     Past Surgical History:  Procedure Laterality Date  . CESAREAN SECTION      There were no vitals filed for this visit.  Subjective Assessment - 09/27/18 0849    Subjective  Pt stating that she feels like HEP is helping but pain still predominantly on plantar aspect of foot although also still has difficulty wearing a show that rests on the top of her foot.    Patient Stated Goals  "be able to work normal shift (8 hrs) w/o pain"    Currently in Pain?  Yes    Pain Score  6    5-6/10   Pain Location  Foot    Pain Orientation  Left   primarly plantar surface   Pain Descriptors / Indicators  Sharp    Pain Type  Acute pain    Pain Frequency  Constant                       [facility] Adult PT Treatment/Exercise - 09/27/18 0846      Exercises   Exercises  Ankle      Manual Therapy   Manual Therapy  Joint mobilization;Soft tissue mobilization;Myofascial release;Taping    Joint Mobilization  L A/P & P/A talocrurual joint mobs    Soft tissue mobilization  STM to L plantar  fascia - limited tolerance    Myofascial Release  pin & stretch to L plantar fascia - limited tolerance    Kinesiotex  Facilitate Muscle      Kinesiotix   Facilitate Muscle   3 strip posterior tibialis patter L foot - 30% along abductor hallicus, [id number] stirrup; 30-50% along posterior tibialis tendon      Ankle Exercises: Stretches   Plantar Fascia Stretch  30 seconds;1 rep    Plantar Fascia Stretch Limitations  long-sitting with towel    Soleus Stretch  30 seconds;1 rep    Soleus Stretch Limitations  long-sitting with towel    Gastroc Stretch  30 seconds;1 rep    Gastroc Stretch Limitations  long-sitting with towel      Ankle Exercises: Aerobic   Nustep  L4 x 6 min (UE/LE)      Ankle Exercises: Seated   Ankle Circles/Pumps  Left;20 reps;10 reps;AROM    Ankle Circles/Pumps Limitations  APs x 20; CW/CCW circles x 10 each  Heel Raises  Both;20 reps;3 seconds    Toe Raise  20 reps;3 seconds    Other Seated Ankle Exercises  L PF ankle press with red TB x 10      Ankle Exercises: Sidelying   Ankle Inversion  Left;AROM;10 reps    Ankle Eversion  Left;AROM;10 reps             PT Education - 09/27/18 0930    Education Details  HEP update    Person(s) Educated  Patient    Methods  Explanation;Demonstration;Handout    Comprehension  Verbalized understanding;Returned demonstration;Need further instruction       PT Short Term Goals - 09/27/18 0853      PT SHORT TERM GOAL #1   Title  Independent with initial HEP    Status  On-going    Target Date  10/02/18        PT Long Term Goals - 09/27/18 0853      PT LONG TERM GOAL #1   Title  Independent with ongoing/advanced HEP    Status  On-going    Target Date  10/23/18      PT LONG TERM GOAL #2   Title  L ankle AROM WFL and essentially equilavent to L to allow for normal gait pattern    Status  On-going    Target Date  10/23/18      PT LONG TERM GOAL #3   Title  L LE strength >/= 4/5 for improved stability     Status  On-going    Target Date  10/23/18      PT LONG TERM GOAL #4   Title  Patient will ambulate with normal gait pattern w/o L foot/ankle pain    Status  On-going    Target Date  10/23/18      PT LONG TERM GOAL #5   Title  Patient will report no limitaiton in standing or walking tolerance due to L foot or ankle pain to allow for return to work fullt-time    Status  On-going    Target Date  10/23/18            Plan - 09/27/18 0854    Clinical Impression Statement  Mary [name] reporting HEP seems to be helping but having difficulty with motion for ankle circles. HEP reviewed with good return demonstration other than difficulty with CW L ankle circles, therefore introduced isolated inversion and eversion in side lying to promote improved lateral movement. Also introduced some light strengthening with HEP updated accordingly.    Comorbidities  chronic LBP, HTN    Rehab Potential  Good    PT Frequency  2x / week    PT Duration  4 weeks    PT Treatment/Interventions  ADLs/Self Care Home Management;Cryotherapy;Electrical Stimulation;Iontophoresis 4mg /ml Dexamethasone;Moist Heat;Ultrasound;Gait training;Stair training;Functional mobility training;Therapeutic activities;Therapeutic exercise;Balance training;Neuromuscular re-education;Patient/family education;Manual techniques;Passive range of motion;Dry needling;Taping;Vasopneumatic Device;Joint Manipulations    PT Next Visit Plan  L ankle ROM and strengthening; gait training    PT Home Exercise Plan  09/18/18 - Gastroc/soleus & plantar fascia stretches, APs & circles; 09/27/18 - Sidelying IV/EV, seated heel/toe raises, red TB PF ankle press    Consulted and Agree with Plan of Care  Patient       Patient will benefit from skilled therapeutic intervention in order to improve the following deficits and impairments:  Abnormal gait, Decreased activity tolerance, Decreased balance, Decreased endurance, Decreased mobility, Decreased range of motion,  Decreased strength, Difficulty walking, Hypomobility, Increased muscle spasms, Impaired  perceived functional ability, Impaired flexibility, Impaired sensation, Improper body mechanics, Pain  Visit Diagnosis: Pain in left ankle and joints of left foot  Stiffness of left ankle, not elsewhere classified  Other abnormalities of gait and mobility  Muscle weakness (generalized)     Problem List Patient Active Problem List   Diagnosis Date Noted  . Hypertension 07/30/2018  . Trigger finger, left middle finger 07/12/2018  . Common migraine 06/07/2017  . Chronic lower back pain 12/21/2015  . Postmenopausal 05/15/2015  . Hyperlipidemia 11/17/2014  . 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 Obesity 11/17/2014  . Knee pain, right 04/09/2013  . Constipation 07/08/2009    Percival Spanish, PT, MPT 09/27/2018, 1:01 PM  [facility] [address]  [place], [place], [address] Phone: [phone]   Fax:  [phone]  Name: Mary [name] MRN: [id number] Date of Birth: 1965/04/30

## 2018-09-27 NOTE — Telephone Encounter (Signed)
Ok to fill? Not on current med list

## 2018-09-28 ENCOUNTER — Encounter: Payer: Self-pay | Admitting: Internal Medicine

## 2018-10-01 ENCOUNTER — Other Ambulatory Visit: Payer: Self-pay

## 2018-10-01 ENCOUNTER — Ambulatory Visit: Payer: PRIVATE HEALTH INSURANCE

## 2018-10-01 DIAGNOSIS — R2689 Other abnormalities of gait and mobility: Secondary | ICD-10-CM

## 2018-10-01 DIAGNOSIS — M25572 Pain in left ankle and joints of left foot: Secondary | ICD-10-CM

## 2018-10-01 DIAGNOSIS — M25672 Stiffness of left ankle, not elsewhere classified: Secondary | ICD-10-CM

## 2018-10-01 DIAGNOSIS — M6281 Muscle weakness (generalized): Secondary | ICD-10-CM

## 2018-10-01 NOTE — Therapy (Signed)
[facility] [address]  [place], [place], [address] Phone: [phone]   Fax:  [phone]  Physical Therapy Treatment  Patient Details  Name: Mary [name] MRN: [id number] Date of Birth: 1965-09-08 Referring Provider (PT): Radene Journey, MD   Encounter Date: 10/01/2018  PT End of Session - 10/01/18 0938    Visit Number  3    Number of Visits  9    Date for PT Re-Evaluation  10/23/18    Authorization Type  WC - 1 eval & 8 treatment visits    Authorization - Visit Number  3    Authorization - Number of Visits  9    PT Start Time  0934    PT Stop Time  1013    PT Time Calculation (min)  39 min    Activity Tolerance  Patient tolerated treatment well    Behavior During Therapy  [facility] for tasks assessed/performed       Past Medical History:  Diagnosis Date  . Allergy   . Arthritis   . Headache(784.0)   . Hyperlipidemia     Past Surgical History:  Procedure Laterality Date  . CESAREAN SECTION      There were no vitals filed for this visit.  Subjective Assessment - 10/01/18 0937    Subjective  Pt. reporting some back pain occasionally over this weekend and unsure if this may be related to HEP activities.    Patient Stated Goals  "be able to work normal shift (8 hrs) w/o pain"    Currently in Pain?  No/denies    Pain Score  0-No pain    Multiple Pain Sites  No                       [facility] Adult PT Treatment/Exercise - 10/01/18 0001      Manual Therapy   Manual Therapy  Joint mobilization;Soft tissue mobilization;Myofascial release;Passive ROM    Joint Mobilization  L A/P & P/A talocrurual joint mobs for improved ROM    Passive ROM  Manual L GS soleus stretch with therapist + great toe extension x 30 sec each       Ankle Exercises: Seated   Heel Raises  Both;20 reps;3 seconds    Toe Raise  20 reps;3 seconds    BAPS  Level 2;Level 1;Sitting;10 reps    BAPS Limitations  IV/EV (lvl 1 -  difficulty), PR/DF (lvl 2); CW, CCW circles - difficulty     Other Seated Ankle Exercises  L PF ankle press with red TB x 15      Ankle Exercises: Aerobic   Nustep  L4 x 7 min (UE/LE)      Ankle Exercises: Sidelying   Ankle Inversion  Left;AROM;15 reps    Ankle Eversion  Left;AROM;15 reps      Ankle Exercises: Supine   T-Band  red TB DF x 15 reps               PT Short Term Goals - 10/01/18 1232      PT SHORT TERM GOAL #1   Title  Independent with initial HEP    Status  Achieved    Target Date  10/02/18        PT Long Term Goals - 09/27/18 0853      PT LONG TERM GOAL #1   Title  Independent with ongoing/advanced HEP    Status  On-going  Target Date  10/23/18      PT LONG TERM GOAL #2   Title  L ankle AROM WFL and essentially equilavent to L to allow for normal gait pattern    Status  On-going    Target Date  10/23/18      PT LONG TERM GOAL #3   Title  L LE strength >/= 4/5 for improved stability    Status  On-going    Target Date  10/23/18      PT LONG TERM GOAL #4   Title  Patient will ambulate with normal gait pattern w/o L foot/ankle pain    Status  On-going    Target Date  10/23/18      PT LONG TERM GOAL #5   Title  Patient will report no limitaiton in standing or walking tolerance due to L foot or ankle pain to allow for return to work fullt-time    Status  On-going    Target Date  10/23/18            Plan - 10/01/18 0938    Clinical Impression Statement  Pt. reporting some back pain over weekend which she partially attributes to performance of HEP update.  Conversation revealed pt. performing updated ankle AROM activities while sidelying on hard floor thus encouraged pt. to lay on bed while performing in future.  Pt. tolerated all gentle ankle ROM and conservative strengthening activities in session without back or ankle pain thus no modification made to HEP at this time.  Pt. with visible difficulty controlling AROM ankle EV/IV today with  isolated BAPS board activities and some cueing required for updated sidelying EV, IV for proper motion.  Pt. with good carryover from cueing and did report independence with initial HEP thus STG #1 met.    Comorbidities  chronic LBP, HTN    Rehab Potential  Good    PT Treatment/Interventions  ADLs/Self Care Home Management;Cryotherapy;Electrical Stimulation;Iontophoresis '4mg'$ /ml Dexamethasone;Moist Heat;Ultrasound;Gait training;Stair training;Functional mobility training;Therapeutic activities;Therapeutic exercise;Balance training;Neuromuscular re-education;Patient/family education;Manual techniques;Passive range of motion;Dry needling;Taping;Vasopneumatic Device;Joint Manipulations    PT Next Visit Plan  L ankle ROM and strengthening; gait training    PT Home Exercise Plan  09/18/18 - Gastroc/soleus & plantar fascia stretches, APs & circles; 09/27/18 - Sidelying IV/EV, seated heel/toe raises, red TB PF ankle press    Consulted and Agree with Plan of Care  Patient       Patient will benefit from skilled therapeutic intervention in order to improve the following deficits and impairments:  Abnormal gait, Decreased activity tolerance, Decreased balance, Decreased endurance, Decreased mobility, Decreased range of motion, Decreased strength, Difficulty walking, Hypomobility, Increased muscle spasms, Impaired perceived functional ability, Impaired flexibility, Impaired sensation, Improper body mechanics, Pain  Visit Diagnosis: Pain in left ankle and joints of left foot  Stiffness of left ankle, not elsewhere classified  Other abnormalities of gait and mobility  Muscle weakness (generalized)     Problem List Patient Active Problem List   Diagnosis Date Noted  . Hypertension 07/30/2018  . Trigger finger, left middle finger 07/12/2018  . Common migraine 06/07/2017  . Chronic lower back pain 12/21/2015  . Postmenopausal 05/15/2015  . Hyperlipidemia 11/17/2014  . 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 Obesity  11/17/2014  . Knee pain, right 04/09/2013  . Constipation 07/08/2009    Bess Harvest, PTA 10/01/18 12:40 PM    [facility] [address]  [place], [place], [address] Phone: [phone]   Fax:  [phone]  Name: Mary [name] MRN: [id number] Date of Birth: Feb 25, 1965

## 2018-10-04 ENCOUNTER — Other Ambulatory Visit: Payer: Self-pay

## 2018-10-04 ENCOUNTER — Ambulatory Visit: Payer: PRIVATE HEALTH INSURANCE | Admitting: Physical Therapy

## 2018-10-04 DIAGNOSIS — R2689 Other abnormalities of gait and mobility: Secondary | ICD-10-CM

## 2018-10-04 DIAGNOSIS — M25572 Pain in left ankle and joints of left foot: Secondary | ICD-10-CM

## 2018-10-04 DIAGNOSIS — M6281 Muscle weakness (generalized): Secondary | ICD-10-CM

## 2018-10-04 DIAGNOSIS — M25672 Stiffness of left ankle, not elsewhere classified: Secondary | ICD-10-CM

## 2018-10-04 NOTE — Therapy (Signed)
[facility] [address]  [place], [place], [address] Phone: [phone]   Fax:  [phone]  Physical Therapy Treatment  Patient Details  Name: Mary [name] MRN: [id number] Date of Birth: [birth year] Referring Provider (PT): Radene Journey, MD   Encounter Date: 10/04/2018  PT End of Session - 10/04/18 0936    Visit Number  4    Number of Visits  9    Date for PT Re-Evaluation  10/23/18    Authorization Type  WC - 1 eval & 8 treatment visits    Authorization - Visit Number  4    Authorization - Number of Visits  9    PT Start Time  [phone]    PT Stop Time  1020    PT Time Calculation (min)  44 min    Activity Tolerance  Patient tolerated treatment well    Behavior During Therapy  [facility] for tasks assessed/performed       Past Medical History:  Diagnosis Date  . Allergy   . Arthritis   . Headache(784.0)   . Hyperlipidemia     Past Surgical History:  Procedure Laterality Date  . CESAREAN SECTION      There were no vitals filed for this visit.  Subjective Assessment - 10/04/18 0938    Subjective  Pt reporting continued back pain today but denies foot/ankle pain. Feels like taping helped with foot/ankle pain.    Patient Stated Goals  "be able to work normal shift (8 hrs) w/o pain"    Currently in Pain?  Yes    Pain Score  0-No pain    Pain Location  Foot    Pain Score  6    Pain Location  Back    Pain Orientation  Lower    Pain Descriptors / Indicators  Aching    Pain Type  Acute pain;Chronic pain    Pain Radiating Towards  n/a    Pain Frequency  Intermittent         [facility] PT Assessment - 10/04/18 0936      Assessment   Medical Diagnosis  L foot & ankle sprain    Referring Provider (PT)  Radene Journey, MD    Onset Date/Surgical Date  07/19/18    Next MD Visit  10/05/18      AROM   Left Ankle Dorsiflexion  8    Left Ankle Plantar Flexion  46    Left Ankle Inversion  14    Left Ankle Eversion   15      Strength   Right Hip Flexion  4+/5    Right Hip Extension  4/5    Right Hip External Rotation   4+/5    Right Hip Internal Rotation  5/5    Right Hip ABduction  4/5    Right Hip ADduction  4/5    Left Hip Flexion  4/5    Left Hip Extension  4-/5    Left Hip External Rotation  4-/5    Left Hip Internal Rotation  4/5    Left Hip ABduction  4-/5    Left Hip ADduction  4-/5    Right Knee Flexion  4+/5    Right Knee Extension  4+/5    Left Knee Flexion  4/5    Left Knee Extension  4/5    Right Ankle Dorsiflexion  4+/5    Right Ankle Plantar Flexion  4+/5    Right Ankle Inversion  4+/5    Right Ankle Eversion  4+/5    Left Ankle Dorsiflexion  4+/5    Left Ankle Plantar Flexion  4/5    Left Ankle Inversion  4/5    Left Ankle Eversion  4/5                   [facility] Adult PT Treatment/Exercise - 10/04/18 0936      Exercises   Exercises  Ankle      Ankle Exercises: Stretches   Plantar Fascia Stretch  30 seconds;1 rep    Plantar Fascia Stretch Limitations  standing with toes on baseboard, leaning into wall    Soleus Stretch  30 seconds;1 rep    Soleus Stretch Limitations  standing leaning into wall    Gastroc Stretch  30 seconds;1 rep    Gastroc Stretch Limitations  standing leaning into wall      Ankle Exercises: Aerobic   Nustep  L4 x 6 min (UE/LE)      Ankle Exercises: Seated   Other Seated Ankle Exercises  L 4-way ankle TB - DF/PF with red TB, IV/EV with yellow band 2 x 10 each    Other Seated Ankle Exercises  B inversion isometric ball squeeze 10 x 5"             PT Education - 10/04/18 1020    Education Details  HEP progression    Person(s) Educated  Patient    Methods  Explanation;Demonstration;Handout    Comprehension  Verbalized understanding;Returned demonstration;Need further instruction       PT Short Term Goals - 10/04/18 0941      PT SHORT TERM GOAL #1   Title  Independent with initial HEP    Status  Achieved        PT  Long Term Goals - 10/04/18 1020      PT LONG TERM GOAL #1   Title  Independent with ongoing/advanced HEP    Status  Partially Met    Target Date  10/23/18      PT LONG TERM GOAL #2   Title  L ankle AROM WFL and essentially equilavent to L to allow for normal gait pattern    Status  Partially Met    Target Date  10/23/18      PT LONG TERM GOAL #3   Title  L LE strength >/= 4/5 for improved stability    Status  Partially Met    Target Date  10/23/18      PT LONG TERM GOAL #4   Title  Patient will ambulate with normal gait pattern w/o L foot/ankle pain    Status  Partially Met    Target Date  10/23/18      PT LONG TERM GOAL #5   Title  Patient will report no limitaiton in standing or walking tolerance due to L foot or ankle pain to allow for return to work fullt-time    Status  On-going    Target Date  10/23/18            Plan - 10/04/18 0942    Clinical Impression Statement  Mary [name] feels that therapy has been helping - no L foot or ankle pain currently but does report increased LBP and ongoing L wrist pain. L ankle ROM improving but still mildly limited in all directions. Overall LE strength improving with significant gains in L ankle to 4/5 w/o pain on resistance, however L LE still mildly weaker than right on  average for most muscle testing. Patient ambulating w/o limp in clinic but still feels like she starts to limp after walking longer distances. ST met and LTGs mostly partially met at present. Mary [name] will continue to benefit from skilled PT to restore full L ankle ROM and improve LE strength to allow for return to work at full capacity as well as improve balance and proprioception to reduce risk of further injury.    Comorbidities  chronic LBP, HTN    Rehab Potential  Good    PT Frequency  2x / week    PT Duration  4 weeks    PT Treatment/Interventions  ADLs/Self Care Home Management;Cryotherapy;Electrical Stimulation;Iontophoresis 42m/ml Dexamethasone;Moist  Heat;Ultrasound;Gait training;Stair training;Functional mobility training;Therapeutic activities;Therapeutic exercise;Balance training;Neuromuscular re-education;Patient/family education;Manual techniques;Passive range of motion;Dry needling;Taping;Vasopneumatic Device;Joint Manipulations    PT Next Visit Plan  L ankle ROM and strengthening; gait training    PT Home Exercise Plan  09/18/18 - Gastroc/soleus & plantar fascia stretches, APs & circles; 09/27/18 - Sidelying IV/EV, seated heel/toe raises, red TB PF ankle press; 9/17 - standing versions of stretches, 4-way ankle TB exercises (red DF/PF, yellow IV/EV), toe curls with yellow TB    Consulted and Agree with Plan of Care  Patient       Patient will benefit from skilled therapeutic intervention in order to improve the following deficits and impairments:  Abnormal gait, Decreased activity tolerance, Decreased balance, Decreased endurance, Decreased mobility, Decreased range of motion, Decreased strength, Difficulty walking, Hypomobility, Increased muscle spasms, Impaired perceived functional ability, Impaired flexibility, Impaired sensation, Improper body mechanics, Pain  Visit Diagnosis: Pain in left ankle and joints of left foot  Stiffness of left ankle, not elsewhere classified  Other abnormalities of gait and mobility  Muscle weakness (generalized)     Problem List Patient Active Problem List   Diagnosis Date Noted  . Hypertension 07/30/2018  . Trigger finger, left middle finger 07/12/2018  . Common migraine 06/07/2017  . Chronic lower back pain 12/21/2015  . Postmenopausal 05/15/2015  . Hyperlipidemia 11/17/2014  . 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 Obesity 11/17/2014  . Knee pain, right 04/09/2013  . Constipation 07/08/2009    JPercival Spanish PT, MPT 10/04/2018, 12:20 PM  [facility] [place] [address]Phone: [phone]  Fax:   [phone] Name: JTAIZ BICKLEMRN: [id number] of Birth: 604/01/1965

## 2018-10-08 ENCOUNTER — Ambulatory Visit: Payer: PRIVATE HEALTH INSURANCE

## 2018-10-08 ENCOUNTER — Other Ambulatory Visit: Payer: Self-pay

## 2018-10-08 DIAGNOSIS — M25572 Pain in left ankle and joints of left foot: Secondary | ICD-10-CM

## 2018-10-08 DIAGNOSIS — M6281 Muscle weakness (generalized): Secondary | ICD-10-CM

## 2018-10-08 DIAGNOSIS — R2689 Other abnormalities of gait and mobility: Secondary | ICD-10-CM

## 2018-10-08 DIAGNOSIS — M25672 Stiffness of left ankle, not elsewhere classified: Secondary | ICD-10-CM

## 2018-10-08 NOTE — Therapy (Signed)
[facility] [address]  [place], [place], [address] Phone: [phone]   Fax:  [phone]  Physical Therapy Treatment  Patient Details  Name: Mary [name] MRN: [id number] Date of Birth: 1965/03/15 Referring Provider (PT): Radene Journey, MD   Encounter Date: 10/08/2018  PT End of Session - 10/08/18 0950    Visit Number  5    Number of Visits  9    Date for PT Re-Evaluation  10/23/18    Authorization Type  WC - 1 eval & 8 treatment visits    Authorization - Visit Number  5    Authorization - Number of Visits  9    PT Start Time  0932    PT Stop Time  1012    PT Time Calculation (min)  40 min    Activity Tolerance  Patient tolerated treatment well    Behavior During Therapy  [facility] for tasks assessed/performed       Past Medical History:  Diagnosis Date  . Allergy   . Arthritis   . Headache(784.0)   . Hyperlipidemia     Past Surgical History:  Procedure Laterality Date  . CESAREAN SECTION      There were no vitals filed for this visit.  Subjective Assessment - 10/08/18 0941    Subjective  Pt. primary concern today is L wrist pain.    Patient Stated Goals  "be able to work normal shift (8 hrs) w/o pain"    Currently in Pain?  No/denies    Pain Score  0-No pain   up to 2/10 pain at worst at times without known trigger   Pain Location  Foot    Pain Orientation  Left    Pain Descriptors / Indicators  Sharp    Pain Type  Acute pain    Pain Onset  More than a month ago    Pain Frequency  Constant    Aggravating Factors   prolonged standing    Pain Relieving Factors  warm water with epsom salts    Effect of Pain on Daily Activities  limits what type of footwear she can wear    Multiple Pain Sites  Yes    Pain Score  5    Pain Location  Back    Pain Orientation  Left;Lower;Right   L>R   Pain Descriptors / Indicators  Aching    Pain Type  Acute pain;Chronic pain    Pain Frequency  Intermittent    Aggravating Factors   bending    Pain Relieving Factors  walking    Effect of Pain on Daily Activities  limits sitting tolerance    Pain Score  3    Pain Location  Wrist    Pain Orientation  Left;Anterior    Pain Type  Acute pain    Pain Onset  More than a month ago    Pain Frequency  Intermittent    Aggravating Factors   pushing off when standing    Pain Relieving Factors  rest                       [facility] Adult PT Treatment/Exercise - 10/08/18 0001      Knee/Hip Exercises: Standing   Hip Flexion  Left;10 reps;Knee straight;Stengthening    Hip Flexion Limitations  at counter     Hip Abduction  Left;10 reps;Knee straight;Stengthening    Abduction Limitations  at counter  Hip Extension  Left;10 reps;Knee straight;Stengthening    Extension Limitations  at counter     Functional Squat  10 reps;3 seconds    Functional Squat Limitations  counter with UE support      Ankle Exercises: Aerobic   Recumbent Bike  Llv 1, 7 min       Ankle Exercises: Standing   Heel Raises  10 reps;Both    Toe Raise  10 reps;3 seconds      Ankle Exercises: Stretches   Plantar Fascia Stretch  30 seconds;1 rep    Plantar Fascia Stretch Limitations  standing with toes on baseboard, leaning into wall    Soleus Stretch  30 seconds;1 rep    Soleus Stretch Limitations  standing leaning into wall    Gastroc Stretch  30 seconds;1 rep    Gastroc Stretch Limitations  standing leaning into wall    Other Stretch  supine L gastroc stretch with strap x 30 sec       Ankle Exercises: Seated   Other Seated Ankle Exercises  L ankle red TB EV, IV, DFx 15 reps               PT Short Term Goals - 10/04/18 0941      PT SHORT TERM GOAL #1   Title  Independent with initial HEP    Status  Achieved        PT Long Term Goals - 10/04/18 1020      PT LONG TERM GOAL #1   Title  Independent with ongoing/advanced HEP    Status  Partially Met    Target Date  10/23/18      PT LONG TERM GOAL  #2   Title  L ankle AROM WFL and essentially equilavent to L to allow for normal gait pattern    Status  Partially Met    Target Date  10/23/18      PT LONG TERM GOAL #3   Title  L LE strength >/= 4/5 for improved stability    Status  Partially Met    Target Date  10/23/18      PT LONG TERM GOAL #4   Title  Patient will ambulate with normal gait pattern w/o L foot/ankle pain    Status  Partially Met    Target Date  10/23/18      PT LONG TERM GOAL #5   Title  Patient will report no limitaiton in standing or walking tolerance due to L foot or ankle pain to allow for return to work fullt-time    Status  On-going    Target Date  10/23/18            Plan - 10/08/18 0950    Clinical Impression Statement  Pt. reporting she feels 80% improvement in ankle/foot pain since starting therapy and reports she is performing HEP daily.  Tolerated progression of L sided LE strengthening at proximal hip and L ankle isolated strengthening activities well today without pain.  Did see MD a few days ago with pt. verbalizing she has referral for incorporation of L wrist pain into therapy POC.  Pt. to see supervising PT at next visit to address new referral.  Pt. ended session pain free thus modalities deferred.    Personal Factors and Comorbidities  Profession;Comorbidity 2    Comorbidities  chronic LBP, HTN    Rehab Potential  Good    PT Treatment/Interventions  ADLs/Self Care Home Management;Cryotherapy;Electrical Stimulation;Iontophoresis 12m/ml Dexamethasone;Moist Heat;Ultrasound;Gait training;Stair training;Functional mobility  training;Therapeutic activities;Therapeutic exercise;Balance training;Neuromuscular re-education;Patient/family education;Manual techniques;Passive range of motion;Dry needling;Taping;Vasopneumatic Device;Joint Manipulations    PT Next Visit Plan  L ankle ROM and strengthening; gait training    PT Home Exercise Plan  09/18/18 - Gastroc/soleus & plantar fascia stretches, APs &  circles; 09/27/18 - Sidelying IV/EV, seated heel/toe raises, red TB PF ankle press; 9/17 - standing versions of stretches, 4-way ankle TB exercises (red DF/PF, yellow IV/EV), toe curls with yellow TB    Consulted and Agree with Plan of Care  Patient       Patient will benefit from skilled therapeutic intervention in order to improve the following deficits and impairments:  Abnormal gait, Decreased activity tolerance, Decreased balance, Decreased endurance, Decreased mobility, Decreased range of motion, Decreased strength, Difficulty walking, Hypomobility, Increased muscle spasms, Impaired perceived functional ability, Impaired flexibility, Impaired sensation, Improper body mechanics, Pain  Visit Diagnosis: Pain in left ankle and joints of left foot  Stiffness of left ankle, not elsewhere classified  Other abnormalities of gait and mobility  Muscle weakness (generalized)     Problem List Patient Active Problem List   Diagnosis Date Noted  . Hypertension 07/30/2018  . Trigger finger, left middle finger 07/12/2018  . Common migraine 06/07/2017  . Chronic lower back pain 12/21/2015  . Postmenopausal 05/15/2015  . Hyperlipidemia 11/17/2014  . 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 Obesity 11/17/2014  . Knee pain, right 04/09/2013  . Constipation 07/08/2009    Bess Harvest, PTA 10/08/18 1:09 PM   [facility] [address]  [place], [place], [address] Phone: [phone]   Fax:  [phone]  Name: Mary [name] MRN: [id number] Date of Birth: 18-Dec-1965

## 2018-10-11 ENCOUNTER — Ambulatory Visit: Payer: PRIVATE HEALTH INSURANCE | Admitting: Physical Therapy

## 2018-10-11 ENCOUNTER — Encounter: Payer: Self-pay | Admitting: Physical Therapy

## 2018-10-11 ENCOUNTER — Other Ambulatory Visit: Payer: Self-pay

## 2018-10-11 DIAGNOSIS — M25532 Pain in left wrist: Secondary | ICD-10-CM

## 2018-10-11 DIAGNOSIS — M25672 Stiffness of left ankle, not elsewhere classified: Secondary | ICD-10-CM

## 2018-10-11 DIAGNOSIS — R2689 Other abnormalities of gait and mobility: Secondary | ICD-10-CM

## 2018-10-11 DIAGNOSIS — M25572 Pain in left ankle and joints of left foot: Secondary | ICD-10-CM

## 2018-10-11 DIAGNOSIS — M6281 Muscle weakness (generalized): Secondary | ICD-10-CM

## 2018-10-11 NOTE — Therapy (Addendum)
[facility] [address]  [place], [place], [address] Phone: [phone]   Fax:  [phone]  Physical Therapy Treatment / Re-Eval  Patient Details  Name: Mary [name] MRN: [id number] Date of Birth: [birth year] Referring Provider (PT): Radene Journey, MD   Encounter Date: 10/11/2018  PT End of Session - 10/11/18 0929    Visit Number  6    Number of Visits  12   Date for PT Re-Evaluation  10/23/18    Authorization Type  WC - 1 eval & 8 treatment visits each for foot & wrist    Authorization - Visit Number  6    Authorization - Number of Visits  12   PT Start Time  0929    PT Stop Time  1015    PT Time Calculation (min)  46 min    Activity Tolerance  Patient tolerated treatment well    Behavior During Therapy  [facility] for tasks assessed/performed       Past Medical History:  Diagnosis Date  . Allergy   . Arthritis   . Headache(784.0)   . Hyperlipidemia     Past Surgical History:  Procedure Laterality Date  . CESAREAN SECTION      There were no vitals filed for this visit.  Subjective Assessment - 10/11/18 0931    Subjective  Pt reports pain in L wrist causes her difficulty with lifting and holding things in her L hand. Also notes difficulty weight bearing through L hand to push herself up or when closing a door.    Patient Stated Goals  "be able to work normal shift (8 hrs) w/o pain"    Currently in Pain?  Yes    Pain Score  2     Pain Location  Foot    Pain Orientation  Left    Pain Descriptors / Indicators  Aching    Pain Type  Acute pain    Pain Frequency  Intermittent    Pain Score  3    Pain Location  Back    Pain Orientation  Lower    Pain Descriptors / Indicators  Aching    Pain Type  Acute pain;Chronic pain    Pain Score  3    Pain Location  Wrist    Pain Orientation  Left    Pain Descriptors / Indicators  Aching    Pain Type  Acute pain    Pain Onset  More than a month ago    Pain  Frequency  Intermittent    Aggravating Factors   lifting, pushing with L hand    Pain Relieving Factors  ice, topical pain ointment    Effect of Pain on Daily Activities  difficulty to ADL's and weight bearing         [facility] PT Assessment - 10/11/18 0929      Assessment   Medical Diagnosis  L foot & ankle sprain; L wrist sprain    Referring Provider (PT)  Radene Journey, MD    Onset Date/Surgical Date  07/19/18    Hand Dominance  Right    Next MD Visit  Oct 2020      AROM   Overall AROM Comments  L wrist symmetical to R but pain with radial deviation, pronation & supination    AROM Assessment Site  Wrist    Right/Left Wrist  --      Strength   Strength Assessment Site  Forearm;Wrist;Hand  Right/Left Forearm  Right;Left    Right Forearm Pronation  5/5    Right Forearm Supination  5/5    Left Forearm Pronation  4-/5    Left Forearm Supination  4/5    Right/Left Wrist  Right;Left    Right Wrist Flexion  5/5    Right Wrist Extension  5/5    Right Wrist Radial Deviation  5/5    Right Wrist Ulnar Deviation  5/5    Left Wrist Flexion  4-/5    Left Wrist Extension  4/5    Left Wrist Radial Deviation  4/5    Left Wrist Ulnar Deviation  4/5    Right/Left hand  Right;Left    Right Hand Grip (lbs)  19.67   15, 25, 19   Right Hand Lateral Pinch  15.67 lbs   16, 16, 15   Right Hand 3 Point Pinch  16 lbs   14, 17, 17   Left Hand Grip (lbs)  8.33   6, 5 14   Left Hand Lateral Pinch  11.67 lbs   12, 12, 11   Left Hand 3 Point Pinch  11.33 lbs   12, 11, 11                  [facility] Adult PT Treatment/Exercise - 10/11/18 0929      Wrist Exercises   Wrist Flexion  Left;10 reps;AAROM;AROM;Seated;Bar weights/barbell    Bar Weights/Barbell (Wrist Flexion)  1 lb    Wrist Flexion Limitations  AAROM concentric, AROM eccentric    Wrist Extension  Left;10 reps;AAROM;AROM;Seated;Bar weights/barbell    Bar Weights/Barbell (Wrist Extension)  1 lb    Wrist Extension Limitations   AAROM concentric, AROM eccentric    Wrist Radial Deviation  Left;10 reps;AAROM;AROM;Seated;Bar weights/barbell    Bar Weights/Barbell (Radial Deviation)  1 lb    Wrist Radial Deviation Limitations  AAROM concentric, AROM eccentric    Other wrist exercises  L wrist flexor & extensor stretch x 30 sec each      Ankle Exercises: Aerobic   Nustep  L5 x 6 min (UE/LE)             PT Education - 10/11/18 1015    Education Details  Initial wrist HEP    Person(s) Educated  Patient    Methods  Explanation;Demonstration;Handout    Comprehension  Verbalized understanding;Returned demonstration;Need further instruction       PT Short Term Goals - 10/04/18 0941      PT SHORT TERM GOAL #1   Title  Independent with initial HEP    Status  Achieved        PT Long Term Goals - 10/11/18 1516      PT LONG TERM GOAL #1   Title  Independent with ongoing/advanced HEP    Status  Partially Met    Target Date  10/23/18      PT LONG TERM GOAL #2   Title  L ankle AROM WFL and essentially equilavent to L to allow for normal gait pattern    Status  Partially Met    Target Date  10/23/18      PT LONG TERM GOAL #3   Title  L LE strength >/= 4/5 for improved stability    Status  Partially Met    Target Date  10/23/18      PT LONG TERM GOAL #4   Title  Patient will ambulate with normal gait pattern w/o L foot/ankle pain    Status  Partially Met    Target Date  10/23/18      PT LONG TERM GOAL #5   Title  Patient will report no limitaiton in standing or walking tolerance due to L foot or ankle pain to allow for return to work fullt-time    Status  On-going    Target Date  10/23/18      PT LONG TERM GOAL #6   Title  L wrist and forearm strength >/= 4+/5 and grip strength within 5# of R hand to allow for functional use of L hand    Status  New    Target Date  11/08/18      PT LONG TERM GOAL #7   Title  Patient to report ability to perform ADLs, household and work-related tasks without  increased L wrist pain    Status  New    Target Date  11/08/18            Plan - 10/11/18 1015    Clinical Impression Statement  New referral received to eval and treat for L wrist sprain in addition to current POC for ankle. Sharisse reports wrist injury sustained from same fall which injured her L foot/ankle. She localizes pain to L medial carpals and thenar eminence but ttp also elicited in L wrist flexor and extensor muscle groups. L wrist AROM essentially WNL and equivalent to R, however pain reported at extremes of motion and with MMT resistance. Mild L wrist and forearm weakness present with grip strength less than half of R hand (dominant side) and mild decreased pinch strength also noted. Pain and ROM/strength limitations limit her ability to lift and hold things in her L hand as well as bear weight through her L hand to push herself up or when closing a door. Training provided in initial stretching and strengthening HEP for L wrist today and will continue to incorporate wrist rehab into remaining PT visits to address above deficits.    Comorbidities  chronic LBP, HTN    Rehab Potential  Good    PT Frequency  2x / week    PT Duration  --    PT Treatment/Interventions  ADLs/Self Care Home Management;Cryotherapy;Electrical Stimulation;Iontophoresis 58m/ml Dexamethasone;Moist Heat;Ultrasound;Gait training;Stair training;Functional mobility training;Therapeutic activities;Therapeutic exercise;Balance training;Neuromuscular re-education;Patient/family education;Manual techniques;Passive range of motion;Dry needling;Taping;Vasopneumatic Device;Joint Manipulations    PT Next Visit Plan  review initial wrist HEP; L ankle ROM and strengthening; gait training    PT Home Exercise Plan  09/18/18 - Gastroc/soleus & plantar fascia stretches, APs & circles; 09/27/18 - Sidelying IV/EV, seated heel/toe raises, red TB PF ankle press; 10/04/18 - standing versions of stretches, 4-way ankle TB exercises (red  DF/PF, yellow IV/EV), toe curls with yellow TB; 10/11/18 - L wrist flexor/extensor stretches, L wrist eccentric strengthening    Consulted and Agree with Plan of Care  Patient       Patient will benefit from skilled therapeutic intervention in order to improve the following deficits and impairments:  Abnormal gait, Decreased activity tolerance, Decreased balance, Decreased endurance, Decreased mobility, Decreased range of motion, Decreased strength, Difficulty walking, Hypomobility, Increased muscle spasms, Impaired perceived functional ability, Impaired flexibility, Impaired sensation, Improper body mechanics, Pain, Impaired UE functional use  Visit Diagnosis: Pain in left ankle and joints of left foot  Stiffness of left ankle, not elsewhere classified  Other abnormalities of gait and mobility  Muscle weakness (generalized)  Pain in left wrist     Problem List Patient Active Problem List   Diagnosis Date Noted  .  Hypertension 07/30/2018  . Trigger finger, left middle finger 07/12/2018  . Common migraine 06/07/2017  . Chronic lower back pain 12/21/2015  . Postmenopausal 05/15/2015  . Hyperlipidemia 11/17/2014  . 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 Obesity 11/17/2014  . Knee pain, right 04/09/2013  . Constipation 07/08/2009    Percival Spanish, PT, MPT 10/11/2018, 3:21 PM  [facility] [address]  [place], [place], [address] Phone: [phone]   Fax:  [phone]  Name: JAILA SCHELLHORN MRN: [id number] Date of Birth: 30-Sep-1965

## 2018-10-15 ENCOUNTER — Ambulatory Visit: Payer: PRIVATE HEALTH INSURANCE

## 2018-10-15 ENCOUNTER — Other Ambulatory Visit: Payer: Self-pay

## 2018-10-15 DIAGNOSIS — M25572 Pain in left ankle and joints of left foot: Secondary | ICD-10-CM

## 2018-10-15 DIAGNOSIS — R2689 Other abnormalities of gait and mobility: Secondary | ICD-10-CM

## 2018-10-15 DIAGNOSIS — M25672 Stiffness of left ankle, not elsewhere classified: Secondary | ICD-10-CM

## 2018-10-15 DIAGNOSIS — M6281 Muscle weakness (generalized): Secondary | ICD-10-CM

## 2018-10-15 DIAGNOSIS — M25532 Pain in left wrist: Secondary | ICD-10-CM

## 2018-10-15 NOTE — Therapy (Addendum)
[facility] [address]  [place], [place], [address] Phone: [phone]   Fax:  [phone]  Physical Therapy Treatment  Patient Details  Name: Mary [name] MRN: [id number] Date of Birth: 1965/09/07 Referring Provider (PT): Radene Journey, MD   Encounter Date: 10/15/2018  PT End of Session - 10/15/18 0937    Visit Number  7    Number of Visits  17    Date for PT Re-Evaluation  10/23/18    Authorization Type  WC - 1 eval & 8 treatment visits each for foot & wrist    Authorization - Visit Number  7    Authorization - Number of Visits  17    PT Start Time  [phone]    PT Stop Time  3570   ended visit with 10 min moist heat   PT Time Calculation (min)  52 min    Activity Tolerance  Patient tolerated treatment well    Behavior During Therapy  [facility] for tasks assessed/performed       Past Medical History:  Diagnosis Date  . Allergy   . Arthritis   . Headache(784.0)   . Hyperlipidemia     Past Surgical History:  Procedure Laterality Date  . CESAREAN SECTION      There were no vitals filed for this visit.  Subjective Assessment - 10/15/18 0940    Subjective  Pt. reporting she is fine working on wrist today in session as she has been having some wrist pain opening doors and pushing up to stand from chair.    Patient Stated Goals  "be able to work normal shift (8 hrs) w/o pain"    Currently in Pain?  No/denies    Pain Score  0-No pain    Pain Location  Foot    Pain Orientation  Left    Pain Descriptors / Indicators  Aching    Pain Type  Acute pain    Multiple Pain Sites  No    Pain Score  0   up to 4/10 pain at worst   Pain Location  Back    Pain Orientation  Lower    Pain Descriptors / Indicators  Aching    Pain Type  Acute pain;Chronic pain    Pain Frequency  Intermittent    Aggravating Factors   bending, prolonged sitting    Pain Relieving Factors  walking    Pain Score  4    Pain Location  Wrist     Pain Orientation  Left    Pain Descriptors / Indicators  Throbbing   "wounds pain"   Pain Type  Acute pain    Pain Onset  More than a month ago    Pain Frequency  Intermittent    Aggravating Factors   pressing down with thumb    Pain Relieving Factors  ice         [facility] PT Assessment - 10/15/18 0001      Assessment   Medical Diagnosis  L foot & ankle sprain; L wrist sprain    Referring Provider (PT)  Radene Journey, MD    Onset Date/Surgical Date  07/19/18    Hand Dominance  Right    Next MD Visit  11/02/18                   [facility] Adult PT Treatment/Exercise - 10/15/18 0001      Elbow Exercises   Forearm Supination  Left;AROM;10 reps  Forearm Supination Limitations  Hammer at mid grip; on table      Forearm Pronation  Left;AROM;10 reps    Forearm Pronation Limitations  hammer at mid grip; on table     Wrist Flexion  Left;AROM;Seated;Bar weights/barbell;AAROM;15 reps    Bar Weights/Barbell (Wrist Flexion)  1 lb    Wrist Flexion Limitations  AAROM con/L AROM ecc    Wrist Extension  Left;AAROM;AROM;Seated;Bar weights/barbell;15 reps    Bar Weights/Barbell (Wrist Extension)  1 lb    Wrist Extension Limitations  AAROM concentric/AROM ecc       Hand Exercises   Other Hand Exercises  L Stovell squeeze grip yellow spring (15#) x 10 rpes; 3" eccentric       Wrist Exercises   Wrist Radial Deviation  Left;AAROM;AROM;Seated;Bar weights/barbell;15 reps    Bar Weights/Barbell (Radial Deviation)  1 lb    Wrist Radial Deviation Limitations  AAROM con/AROM ecc     Other wrist exercises  L wrist flexor & extensor stretch 2 x 30 sec each      Moist Heat Therapy   Number Minutes Moist Heat  10 Minutes    Moist Heat Location  Elbow   L extensor group      Ankle Exercises: Aerobic   Other Aerobic  UBE: lvl 1.0, 3 min forwards/3 min backwards         Manual Therapy: STM to L wrist extensor group - ttp        PT Short Term Goals - 10/04/18 0941      PT SHORT  TERM GOAL #1   Title  Independent with initial HEP    Status  Achieved        PT Long Term Goals - 10/15/18 1003      PT LONG TERM GOAL #1   Title  Independent with ongoing/advanced HEP    Status  Partially Met      PT LONG TERM GOAL #2   Title  L ankle AROM WFL and essentially equilavent to L to allow for normal gait pattern    Status  Partially Met      PT LONG TERM GOAL #3   Title  L LE strength >/= 4/5 for improved stability    Status  Partially Met      PT LONG TERM GOAL #4   Title  Patient will ambulate with normal gait pattern w/o L foot/ankle pain    Status  Partially Met      PT LONG TERM GOAL #5   Title  Patient will report no limitaiton in standing or walking tolerance due to L foot or ankle pain to allow for return to work fullt-time    Status  On-going      PT LONG TERM GOAL #6   Title  L wrist and forearm strength >/= 4+/5 and grip strength within 5# of R hand to allow for functional use of L hand    Status  On-going      PT LONG TERM GOAL #7   Title  Patient to report ability to perform ADLs, household and work-related tasks without increased L wrist pain    Status  On-going            Plan - 10/15/18 0944    Clinical Impression Statement  Session focused on L wrist and forearm strengthening activities as pt. noting difficulty/pain pushing up from chair and opening doors.  Pt. reports she has been performing updated wrist focused HEP daily over weekend and  only required min cueing at times for slow eccentric lowering with wrist curls.  Mildly progressed wrist extension, flexion, radial deviation repetitions with 1# dumbbell today and initiated pronation/supination strengthening.  MT addressing palpable TPs in L wrist extensor group which were tender however seemed to respond well with reduction in tone following MT.  Ended session with moist heat to L wrist extensor group for hopeful relaxation of musculature and improved tolerance for functional tasks.     Personal Factors and Comorbidities  Profession;Comorbidity 2    Comorbidities  chronic LBP, HTN    Rehab Potential  Good    PT Treatment/Interventions  ADLs/Self Care Home Management;Cryotherapy;Electrical Stimulation;Iontophoresis 57m/ml Dexamethasone;Moist Heat;Ultrasound;Gait training;Stair training;Functional mobility training;Therapeutic activities;Therapeutic exercise;Balance training;Neuromuscular re-education;Patient/family education;Manual techniques;Passive range of motion;Dry needling;Taping;Vasopneumatic Device;Joint Manipulations    PT Next Visit Plan  L ankle, wrist ROM and strengthening; gait training    PT Home Exercise Plan  09/18/18 - Gastroc/soleus & plantar fascia stretches, APs & circles; 09/27/18 - Sidelying IV/EV, seated heel/toe raises, red TB PF ankle press; 10/04/18 - standing versions of stretches, 4-way ankle TB exercises (red DF/PF, yellow IV/EV), toe curls with yellow TB; 10/11/18 - L wrist flexor/extensor stretches, L wrist eccentric strengthening    Consulted and Agree with Plan of Care  Patient       Patient will benefit from skilled therapeutic intervention in order to improve the following deficits and impairments:  Abnormal gait, Decreased activity tolerance, Decreased balance, Decreased endurance, Decreased mobility, Decreased range of motion, Decreased strength, Difficulty walking, Hypomobility, Increased muscle spasms, Impaired perceived functional ability, Impaired flexibility, Impaired sensation, Improper body mechanics, Pain, Impaired UE functional use  Visit Diagnosis: Pain in left ankle and joints of left foot  Stiffness of left ankle, not elsewhere classified  Other abnormalities of gait and mobility  Muscle weakness (generalized)  Pain in left wrist     Problem List Patient Active Problem List   Diagnosis Date Noted  . Hypertension 07/30/2018  . Trigger finger, left middle finger 07/12/2018  . Common migraine 06/07/2017  . Chronic lower back  pain 12/21/2015  . Postmenopausal 05/15/2015  . Hyperlipidemia 11/17/2014  . 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 Obesity 11/17/2014  . Knee pain, right 04/09/2013  . Constipation 07/08/2009    MBess Harvest PTA 10/15/18 12:22 PM   [facility] [address] [place] [address]Phone: [phone]  Fax:  [phone] Name: Mary KAPPESMRN: [id number] of Birth: 612/19/1967

## 2018-10-18 ENCOUNTER — Other Ambulatory Visit: Payer: Self-pay

## 2018-10-18 ENCOUNTER — Encounter: Payer: Self-pay | Admitting: Physical Therapy

## 2018-10-18 ENCOUNTER — Ambulatory Visit: Payer: PRIVATE HEALTH INSURANCE | Attending: Orthopaedic Surgery | Admitting: Physical Therapy

## 2018-10-18 DIAGNOSIS — M25532 Pain in left wrist: Secondary | ICD-10-CM | POA: Diagnosis present

## 2018-10-18 DIAGNOSIS — M25572 Pain in left ankle and joints of left foot: Secondary | ICD-10-CM | POA: Insufficient documentation

## 2018-10-18 DIAGNOSIS — M25672 Stiffness of left ankle, not elsewhere classified: Secondary | ICD-10-CM | POA: Insufficient documentation

## 2018-10-18 DIAGNOSIS — M6281 Muscle weakness (generalized): Secondary | ICD-10-CM | POA: Insufficient documentation

## 2018-10-18 DIAGNOSIS — R2689 Other abnormalities of gait and mobility: Secondary | ICD-10-CM | POA: Diagnosis present

## 2018-10-18 NOTE — Therapy (Addendum)
[facility] [address]  [place], [place], [address] Phone: [phone]   Fax:  [phone]  Physical Therapy Treatment  Patient Details  Name: Mary [name] MRN: [id number] Date of Birth: 12/16/1965 Referring Provider (PT): Radene Journey, MD   Encounter Date: 10/18/2018  PT End of Session - 10/18/18 0929    Visit Number  8    Number of Visits  12    Date for PT Re-Evaluation  11/02/18    Authorization Type  WC - 8 visits from 10/05/18 -11/02/18    Authorization - Visit Number  8    Authorization - Number of Visits  12    PT Start Time  0929    PT Stop Time  1011    PT Time Calculation (min)  42 min    Activity Tolerance  Patient tolerated treatment well    Behavior During Therapy  [facility] for tasks assessed/performed       Past Medical History:  Diagnosis Date  . Allergy   . Arthritis   . Headache(784.0)   . Hyperlipidemia     Past Surgical History:  Procedure Laterality Date  . CESAREAN SECTION      There were no vitals filed for this visit.  Subjective Assessment - 10/18/18 0932    Subjective  Pt reporting no issues with HEPs for wrist or foot.    Patient Stated Goals  "be able to work normal shift (8 hrs) w/o pain"    Currently in Pain?  No/denies    Pain Onset  More than a month ago                       [facility] Adult PT Treatment/Exercise - 10/18/18 0929      Exercises   Exercises  Ankle      Knee/Hip Exercises: Standing   Hip Flexion  Right;Left;10 reps;Knee straight;Stengthening    Hip Flexion Limitations  red TB at ankle; UE support on back of chair    Hip ADduction  Right;Left;10 reps;Strengthening    Hip ADduction Limitations  red TB at ankle; UE support on back of chair    Hip Abduction  Right;Left;10 reps;Knee straight;Stengthening    Abduction Limitations  red TB at ankle; UE support on back of chair    Hip Extension  Right;Left;10 reps;Knee straight;Stengthening     Extension Limitations  red TB at ankle; UE support on back of chair      Ankle Exercises: Aerobic   Nustep  L5 x 6 min (UE/LE)      Ankle Exercises: Standing   Vector Stance  Left    Vector Stance Limitations  L SLS + 4-way R toe to balance pebbles; 1 pole A for balance    Rebounder  Lateral & heel-toe weight shifts, high-knee marching x 20 each (initially with UE support, but gradually progressing to unsupported); mini-squat x 15 (B UE support)    Heel Raises  Both;10 reps;5 seconds   2 sets   Heel Raises Limitations  2nd set - B con/L ecc    Toe Raise  10 reps;5 seconds    Side Shuffle (Round Trip)  B side stepping along counter with looped yellow TB at midfoot 3 x 10 ft               PT Short Term Goals - 10/04/18 0941      PT SHORT TERM GOAL #1   Title  Independent with initial HEP    Status  Achieved        PT Long Term Goals - 10/18/18 1011      PT LONG TERM GOAL #1   Title  Independent with ongoing/advanced HEP    Status  Partially Met    Target Date  11/02/18      PT LONG TERM GOAL #2   Title  L ankle AROM WFL and essentially equilavent to L to allow for normal gait pattern    Status  Partially Met    Target Date  11/02/18      PT LONG TERM GOAL #3   Title  L LE strength >/= 4/5 for improved stability    Status  Partially Met    Target Date  11/02/18      PT LONG TERM GOAL #4   Title  Patient will ambulate with normal gait pattern w/o L foot/ankle pain    Status  Partially Met    Target Date  11/02/18      PT LONG TERM GOAL #5   Title  Patient will report no limitaiton in standing or walking tolerance due to L foot or ankle pain to allow for return to work fullt-time    Status  On-going    Target Date  11/02/18      PT LONG TERM GOAL #6   Title  L wrist and forearm strength >/= 4+/5 and grip strength within 5# of R hand to allow for functional use of L hand    Status  On-going    Target Date  11/02/18      PT LONG TERM GOAL #7   Title   Patient to report ability to perform ADLs, household and work-related tasks without increased L wrist pain    Status  On-going    Target Date  11/02/18            Plan - 10/18/18 0934    Clinical Impression Statement  Mary [name] reporting good comfort with wrist and foot/ankle HEPs, denying need for review today. Able to progress foot/ankle and LE strengthening while incorporating balance and proprioceptive training to improve stability and reduce the risk for reinjury. Patient noting some fatigue with exercises but denies increased pain.    Comorbidities  chronic LBP, HTN    Rehab Potential  Good    PT Frequency  2x / week    PT Treatment/Interventions  ADLs/Self Care Home Management;Cryotherapy;Electrical Stimulation;Iontophoresis 67m/ml Dexamethasone;Moist Heat;Ultrasound;Gait training;Stair training;Functional mobility training;Therapeutic activities;Therapeutic exercise;Balance training;Neuromuscular re-education;Patient/family education;Manual techniques;Passive range of motion;Dry needling;Taping;Vasopneumatic Device;Joint Manipulations    PT Next Visit Plan  L ankle, wrist ROM and strengthening; gait training    PT Home Exercise Plan  09/18/18 - Gastroc/soleus & plantar fascia stretches, APs & circles; 09/27/18 - Sidelying IV/EV, seated heel/toe raises, red TB PF ankle press; 10/04/18 - standing versions of stretches, 4-way ankle TB exercises (red DF/PF, yellow IV/EV), toe curls with yellow TB; 10/11/18 - L wrist flexor/extensor stretches, L wrist eccentric strengthening    Consulted and Agree with Plan of Care  Patient       Patient will benefit from skilled therapeutic intervention in order to improve the following deficits and impairments:  Abnormal gait, Decreased activity tolerance, Decreased balance, Decreased endurance, Decreased mobility, Decreased range of motion, Decreased strength, Difficulty walking, Hypomobility, Increased muscle spasms, Impaired perceived functional ability,  Impaired flexibility, Impaired sensation, Improper body mechanics, Pain, Impaired UE functional use  Visit Diagnosis: Pain in left ankle and joints  of left foot  Stiffness of left ankle, not elsewhere classified  Other abnormalities of gait and mobility  Muscle weakness (generalized)  Pain in left wrist     Problem List Patient Active Problem List   Diagnosis Date Noted  . Hypertension 07/30/2018  . Trigger finger, left middle finger 07/12/2018  . Common migraine 06/07/2017  . Chronic lower back pain 12/21/2015  . Postmenopausal 05/15/2015  . Hyperlipidemia 11/17/2014  . 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 Obesity 11/17/2014  . Knee pain, right 04/09/2013  . Constipation 07/08/2009    Percival Spanish, PT, MPT 10/18/2018, 5:44 PM  [facility] [address]  [place], [place], [address] Phone: [phone]   Fax:  [phone]  Name: Mary [name] MRN: [id number] Date of Birth: 13-Feb-1965

## 2018-10-22 ENCOUNTER — Encounter: Payer: Self-pay | Admitting: Physical Therapy

## 2018-10-22 ENCOUNTER — Other Ambulatory Visit: Payer: Self-pay

## 2018-10-22 ENCOUNTER — Ambulatory Visit: Payer: PRIVATE HEALTH INSURANCE | Admitting: Physical Therapy

## 2018-10-22 DIAGNOSIS — M25572 Pain in left ankle and joints of left foot: Secondary | ICD-10-CM | POA: Diagnosis not present

## 2018-10-22 DIAGNOSIS — M25532 Pain in left wrist: Secondary | ICD-10-CM

## 2018-10-22 DIAGNOSIS — M6281 Muscle weakness (generalized): Secondary | ICD-10-CM

## 2018-10-22 DIAGNOSIS — R2689 Other abnormalities of gait and mobility: Secondary | ICD-10-CM

## 2018-10-22 DIAGNOSIS — M25672 Stiffness of left ankle, not elsewhere classified: Secondary | ICD-10-CM

## 2018-10-22 NOTE — Therapy (Signed)
[facility] [address]  [place], [place], [address] Phone: [phone]   Fax:  [phone]  Physical Therapy Treatment  Patient Details  Name: Mary [name] MRN: [id number] Date of Birth: 1965/04/12 Referring Provider (PT): Radene Journey, MD   Encounter Date: 10/22/2018  PT End of Session - 10/22/18 0933    Visit Number  9    Number of Visits  12    Date for PT Re-Evaluation  11/02/18    Authorization Type  WC - 8 visits from 10/05/18 -11/02/18    Authorization - Visit Number  9    Authorization - Number of Visits  12    PT Start Time  0933    PT Stop Time  1015    PT Time Calculation (min)  42 min    Activity Tolerance  Patient tolerated treatment well    Behavior During Therapy  [facility] for tasks assessed/performed       Past Medical History:  Diagnosis Date  . Allergy   . Arthritis   . Headache(784.0)   . Hyperlipidemia     Past Surgical History:  Procedure Laterality Date  . CESAREAN SECTION      There were no vitals filed for this visit.  Subjective Assessment - 10/22/18 0936    Subjective  Pt w/o complaints or concerns today. Pt reporting wrist HEP has helped with the nighttime pain but still has pain when putting pressure through L hand.    Patient Stated Goals  "be able to work normal shift (8 hrs) w/o pain"    Currently in Pain?  No/denies    Pain Onset  More than a month ago                       [facility] Adult PT Treatment/Exercise - 10/22/18 0933      Elbow Exercises   Forearm Supination  Left;10 reps;Seated    Forearm Supination Limitations  hammer at mid grip; forearm supported on table      Forearm Pronation  Left;10 reps;Seated    Forearm Pronation Limitations  hammer at mid grip; forearm supported on table        Hand Exercises   Other Hand Exercises  L grip strengthening & thumb flexion into yellow theraputty x 2 minutes each      Wrist Exercises   Wrist Flexion   Left;10 reps;Seated;Strengthening;Theraband    Theraband Level (Wrist Flexion)  Level 2 (Red)    Wrist Extension  Left;10 reps;Seated;Strengthening;Theraband    Theraband Level (Wrist Extension)  Level 2 (Red)    Wrist Radial Deviation  Left;10 reps;Seated;Strengthening;Theraband    Theraband Level (Radial Deviation)  Level 2 (Red)    Other wrist exercises  L wrist flexor & extensor stretch 2 x 30 sec each    Other wrist exercises  B wrist extension roll-up & unroll with red TB on PVC pipe x 5       Manual Therapy   Manual Therapy  Soft tissue mobilization;Myofascial release    Soft tissue mobilization  STM to L wrist flexor & extensor groups    Myofascial Release  manual TPR/MFR to L wrist flexor & extensor groups      Ankle Exercises: Aerobic   Nustep  L5 x 6 min (UE/LE)               PT Short Term Goals - 10/04/18 0941      PT SHORT  TERM GOAL #1   Title  Independent with initial HEP    Status  Achieved        PT Long Term Goals - 10/18/18 1011      PT LONG TERM GOAL #1   Title  Independent with ongoing/advanced HEP    Status  Partially Met    Target Date  11/02/18      PT LONG TERM GOAL #2   Title  L ankle AROM WFL and essentially equilavent to L to allow for normal gait pattern    Status  Partially Met    Target Date  11/02/18      PT LONG TERM GOAL #3   Title  L LE strength >/= 4/5 for improved stability    Status  Partially Met    Target Date  11/02/18      PT LONG TERM GOAL #4   Title  Patient will ambulate with normal gait pattern w/o L foot/ankle pain    Status  Partially Met    Target Date  11/02/18      PT LONG TERM GOAL #5   Title  Patient will report no limitaiton in standing or walking tolerance due to L foot or ankle pain to allow for return to work fullt-time    Status  On-going    Target Date  11/02/18      PT LONG TERM GOAL #6   Title  L wrist and forearm strength >/= 4+/5 and grip strength within 5# of R hand to allow for functional  use of L hand    Status  On-going    Target Date  11/02/18      PT LONG TERM GOAL #7   Title  Patient to report ability to perform ADLs, household and work-related tasks without increased L wrist pain    Status  On-going    Target Date  11/02/18            Plan - 10/22/18 0940    Clinical Impression Statement  Mary [name] reporting wrist HEP has helped reduce pain while sleeping at night but still reports pain with pressure through hand. Introduced grip strengthening along with thumb flexion with yellow theraputty with no c/o increased pain. Able to progress wrist exercises to red theraband strengthening with good tolerance for all motions although some post-exercise muscle soreness noted - patient declined need for modalities for pain relief.    Comorbidities  chronic LBP, HTN    Rehab Potential  Good    PT Frequency  2x / week    PT Treatment/Interventions  ADLs/Self Care Home Management;Cryotherapy;Electrical Stimulation;Iontophoresis 22m/ml Dexamethasone;Moist Heat;Ultrasound;Gait training;Stair training;Functional mobility training;Therapeutic activities;Therapeutic exercise;Balance training;Neuromuscular re-education;Patient/family education;Manual techniques;Passive range of motion;Dry needling;Taping;Vasopneumatic Device;Joint Manipulations    PT Next Visit Plan  L ankle, wrist ROM and strengthening; gait training    PT Home Exercise Plan  09/18/18 - Gastroc/soleus & plantar fascia stretches, APs & circles; 09/27/18 - Sidelying IV/EV, seated heel/toe raises, red TB PF ankle press; 10/04/18 - standing versions of stretches, 4-way ankle TB exercises (red DF/PF, yellow IV/EV), toe curls with yellow TB; 10/11/18 - L wrist flexor/extensor stretches, L wrist eccentric strengthening    Consulted and Agree with Plan of Care  Patient       Patient will benefit from skilled therapeutic intervention in order to improve the following deficits and impairments:  Abnormal gait, Decreased activity  tolerance, Decreased balance, Decreased endurance, Decreased mobility, Decreased range of motion, Decreased strength, Difficulty walking, Hypomobility, Increased muscle  spasms, Impaired perceived functional ability, Impaired flexibility, Impaired sensation, Improper body mechanics, Pain, Impaired UE functional use  Visit Diagnosis: Pain in left ankle and joints of left foot  Stiffness of left ankle, not elsewhere classified  Other abnormalities of gait and mobility  Muscle weakness (generalized)  Pain in left wrist     Problem List Patient Active Problem List   Diagnosis Date Noted  . Hypertension 07/30/2018  . Trigger finger, left middle finger 07/12/2018  . Common migraine 06/07/2017  . Chronic lower back pain 12/21/2015  . Postmenopausal 05/15/2015  . Hyperlipidemia 11/17/2014  . 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 Obesity 11/17/2014  . Knee pain, right 04/09/2013  . Constipation 07/08/2009    Percival Spanish, PT, MPT 10/22/2018, 12:42 PM  [facility] [address]  [place], [place], [address] Phone: [phone]   Fax:  [phone]  Name: Mary [name] MRN: [id number] Date of Birth: 10-29-1965

## 2018-10-25 ENCOUNTER — Encounter: Payer: Self-pay | Admitting: Physical Therapy

## 2018-10-25 ENCOUNTER — Ambulatory Visit: Payer: PRIVATE HEALTH INSURANCE | Admitting: Physical Therapy

## 2018-10-25 ENCOUNTER — Other Ambulatory Visit: Payer: Self-pay

## 2018-10-25 DIAGNOSIS — R2689 Other abnormalities of gait and mobility: Secondary | ICD-10-CM

## 2018-10-25 DIAGNOSIS — M6281 Muscle weakness (generalized): Secondary | ICD-10-CM

## 2018-10-25 DIAGNOSIS — M25532 Pain in left wrist: Secondary | ICD-10-CM

## 2018-10-25 DIAGNOSIS — M25572 Pain in left ankle and joints of left foot: Secondary | ICD-10-CM | POA: Diagnosis not present

## 2018-10-25 DIAGNOSIS — M25672 Stiffness of left ankle, not elsewhere classified: Secondary | ICD-10-CM

## 2018-10-25 NOTE — Patient Instructions (Signed)
    Home exercise program created by JoAnne Kreis, PT.  For questions, please contact JoAnne via phone at [phone] or email at [email or web address]  [facility] [address]  [address] [place], [place], [address] Phone: [phone]   Fax:  [phone]    

## 2018-10-25 NOTE — Therapy (Signed)
[facility] [address]  [place], [place], [address] Phone: [phone]   Fax:  [phone]  Physical Therapy Treatment  Patient Details  Name: Mary [name] MRN: [id number] Date of Birth: [birth year] Referring Provider (PT): Radene Journey, MD   Encounter Date: 10/25/2018  PT End of Session - 10/25/18 0932    Visit Number  10    Number of Visits  12    Date for PT Re-Evaluation  11/02/18    Authorization Type  WC - 8 visits from 10/05/18 -11/02/18    Authorization - Visit Number  10    Authorization - Number of Visits  12    PT Start Time  0932    PT Stop Time  1018    PT Time Calculation (min)  46 min    Activity Tolerance  Patient tolerated treatment well    Behavior During Therapy  [facility] for tasks assessed/performed       Past Medical History:  Diagnosis Date  . Allergy   . Arthritis   . Headache(784.0)   . Hyperlipidemia     Past Surgical History:  Procedure Laterality Date  . CESAREAN SECTION      There were no vitals filed for this visit.  Subjective Assessment - 10/25/18 0936    Subjective  Pt reporting mild pain today with some numbness on bottom of foot.    Patient Stated Goals  "be able to work normal shift (8 hrs) w/o pain"    Currently in Pain?  Yes    Pain Score  1     Pain Location  Foot    Pain Orientation  Left    Pain Descriptors / Indicators  Numbness    Pain Type  Acute pain    Pain Frequency  Intermittent    Pain Score  2    Pain Location  Wrist    Pain Orientation  Left    Pain Descriptors / Indicators  --   "bruised"   Pain Frequency  Intermittent    Pain Onset  More than a month ago                       [facility] Adult PT Treatment/Exercise - 10/25/18 0932      Ankle Exercises: Aerobic   Nustep  L5 x 6 min (UE/LE)      Ankle Exercises: Seated   Other Seated Ankle Exercises  L 4-way ankle TB - 2 x 10 reps - 1st set with red TB, 2nd set with green TB       Ankle Exercises: Standing   Heel Raises  Both;10 reps;5 seconds    Heel Raises Limitations  + looped yellow TB at ankles to prevent medial collapse    Toe Raise  10 reps;5 seconds    Side Shuffle (Round Trip)  B side stepping along counter with looped yellow TB at midfoot 4 x 10 ft             PT Education - 10/25/18 1018    Education Details  Foot/ankle HEP review & update -progressed 4-way ankle and toe curls to green TB, progressed heel/toe raises to standing with looped yellow TB at heels/ankles to avoid medial collapse and added side stepping with red TB at midfoot; discontinued 9/10 handout    Person(s) Educated  Patient    Methods  Explanation;Demonstration;Handout    Comprehension  Verbalized understanding;Returned demonstration  PT Short Term Goals - 10/04/18 0941      PT SHORT TERM GOAL #1   Title  Independent with initial HEP    Status  Achieved        PT Long Term Goals - 10/18/18 1011      PT LONG TERM GOAL #1   Title  Independent with ongoing/advanced HEP    Status  Partially Met    Target Date  11/02/18      PT LONG TERM GOAL #2   Title  L ankle AROM WFL and essentially equilavent to L to allow for normal gait pattern    Status  Partially Met    Target Date  11/02/18      PT LONG TERM GOAL #3   Title  L LE strength >/= 4/5 for improved stability    Status  Partially Met    Target Date  11/02/18      PT LONG TERM GOAL #4   Title  Patient will ambulate with normal gait pattern w/o L foot/ankle pain    Status  Partially Met    Target Date  11/02/18      PT LONG TERM GOAL #5   Title  Patient will report no limitaiton in standing or walking tolerance due to L foot or ankle pain to allow for return to work fullt-time    Status  On-going    Target Date  11/02/18      PT LONG TERM GOAL #6   Title  L wrist and forearm strength >/= 4+/5 and grip strength within 5# of R hand to allow for functional use of L hand    Status  On-going    Target  Date  11/02/18      PT LONG TERM GOAL #7   Title  Patient to report ability to perform ADLs, household and work-related tasks without increased L wrist pain    Status  On-going    Target Date  11/02/18            Plan - 10/25/18 0940    Clinical Impression Statement  Mary [name] reporting some of foot/ankle HEP exercises are getting easier and states she has been unable to do side lying ankle inversion/eversion due to back pain in side lying. Reviewed and updated existing foot/ankle HEP, discontinuing the handout from 9/10, progressing theraband resistance with 4-way ankle and toe curls to green TB, progressing heel/toe raises from sitting to standing with looped yellow TB at ankles to avoid medial collapse and adding side stepping with red TB at midfoot. Patient reporting she feels like she is improving to the point where she feels like she could return to work after f/u with MD next week.    Comorbidities  chronic LBP, HTN    Rehab Potential  Good    PT Frequency  2x / week    PT Treatment/Interventions  ADLs/Self Care Home Management;Cryotherapy;Electrical Stimulation;Iontophoresis 39m/ml Dexamethasone;Moist Heat;Ultrasound;Gait training;Stair training;Functional mobility training;Therapeutic activities;Therapeutic exercise;Balance training;Neuromuscular re-education;Patient/family education;Manual techniques;Passive range of motion;Dry needling;Taping;Vasopneumatic Device;Joint Manipulations    PT Next Visit Plan  L ankle, wrist ROM and strengthening; gait training    PT Home Exercise Plan  09/18/18 - Gastroc/soleus & plantar fascia stretches, APs & circles; 10/04/18 - standing versions of stretches, 4-way ankle TB exercises (red DF/PF, yellow IV/EV), toe curls with yellow TB; 10/11/18 - L wrist flexor/extensor stretches, L wrist eccentric strengthening; 10/25/18 - discontinued 9/10 exercises, added side-stepping with red TB at midfoot, progressed 4-way ankle to green  TB, progressed heel/toe raises  to standing with looped yellow TB at ankles to avoid medial collapse    Consulted and Agree with Plan of Care  Patient       Patient will benefit from skilled therapeutic intervention in order to improve the following deficits and impairments:  Abnormal gait, Decreased activity tolerance, Decreased balance, Decreased endurance, Decreased mobility, Decreased range of motion, Decreased strength, Difficulty walking, Hypomobility, Increased muscle spasms, Impaired perceived functional ability, Impaired flexibility, Impaired sensation, Improper body mechanics, Pain, Impaired UE functional use  Visit Diagnosis: Pain in left ankle and joints of left foot  Stiffness of left ankle, not elsewhere classified  Other abnormalities of gait and mobility  Muscle weakness (generalized)  Pain in left wrist     Problem List Patient Active Problem List   Diagnosis Date Noted  . Hypertension 07/30/2018  . Trigger finger, left middle finger 07/12/2018  . Common migraine 06/07/2017  . Chronic lower back pain 12/21/2015  . Postmenopausal 05/15/2015  . Hyperlipidemia 11/17/2014  . 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 Obesity 11/17/2014  . Knee pain, right 04/09/2013  . Constipation 07/08/2009    Percival Spanish, PT, MPT 10/25/2018, 12:41 PM  [facility] [address]  [place], [place], [address] Phone: [phone]   Fax:  [phone]  Name: Mary [name] MRN: [id number] Date of Birth: 08-Nov-1965

## 2018-10-29 ENCOUNTER — Ambulatory Visit: Payer: PRIVATE HEALTH INSURANCE

## 2018-10-29 ENCOUNTER — Other Ambulatory Visit: Payer: Self-pay

## 2018-10-29 DIAGNOSIS — M25672 Stiffness of left ankle, not elsewhere classified: Secondary | ICD-10-CM

## 2018-10-29 DIAGNOSIS — M25572 Pain in left ankle and joints of left foot: Secondary | ICD-10-CM

## 2018-10-29 DIAGNOSIS — R2689 Other abnormalities of gait and mobility: Secondary | ICD-10-CM

## 2018-10-29 DIAGNOSIS — M6281 Muscle weakness (generalized): Secondary | ICD-10-CM

## 2018-10-29 DIAGNOSIS — M25532 Pain in left wrist: Secondary | ICD-10-CM

## 2018-10-29 NOTE — Therapy (Signed)
[facility] [address]  [place], [place], [address] Phone: [phone]   Fax:  [phone]  Physical Therapy Treatment  Patient Details  Name: Mary [name] MRN: [id number] Date of Birth: 21-Jul-1965 Referring Provider (PT): Radene Journey, MD   Encounter Date: 10/29/2018  PT End of Session - 10/29/18 0942    Visit Number  11    Number of Visits  12    Date for PT Re-Evaluation  11/02/18    Authorization Type  WC - 8 visits from 10/05/18 -11/02/18    Authorization - Visit Number  11    Authorization - Number of Visits  12    PT Start Time  0933    PT Stop Time  1012    PT Time Calculation (min)  39 min    Activity Tolerance  Patient tolerated treatment well    Behavior During Therapy  [facility] for tasks assessed/performed       Past Medical History:  Diagnosis Date  . Allergy   . Arthritis   . Headache(784.0)   . Hyperlipidemia     Past Surgical History:  Procedure Laterality Date  . CESAREAN SECTION      There were no vitals filed for this visit.  Subjective Assessment - 10/29/18 0938    Subjective  Pt. reporting no "flare-ups" of pain over weekend.  Reports she is performing HEP daily.    Patient Stated Goals  "be able to work normal shift (8 hrs) w/o pain"    Currently in Pain?  Yes    Pain Score  2     Pain Location  Foot    Pain Orientation  Left    Pain Descriptors / Indicators  Sharp    Pain Type  Acute pain    Pain Onset  More than a month ago    Pain Frequency  Intermittent    Aggravating Factors   prolonged standing    Pain Relieving Factors  moving foot    Multiple Pain Sites  Yes    Pain Score  2    Pain Location  Wrist    Pain Orientation  Left    Pain Descriptors / Indicators  --   "bruised"   Pain Type  Acute pain;Chronic pain    Pain Frequency  Intermittent    Aggravating Factors   pressing on something    Pain Relieving Factors  massage it                        [facility] Adult PT Treatment/Exercise - 10/29/18 0001      Elbow Exercises   Forearm Supination  Left;10 reps;Seated    Forearm Supination Limitations  hammer mid/end grip    Forearm Pronation  Left;10 reps;Seated    Forearm Pronation Limitations  hammer mid/end grip       Hand Exercises   Other Hand Exercises  L Stovell squeeze grip yellow spring (20#) x 10 rpes; 3" eccentric       Wrist Exercises   Wrist Flexion  Left;10 reps;Seated;Strengthening;Bar weights/barbell    Bar Weights/Barbell (Wrist Flexion)  2 lbs    Wrist Extension  Left;10 reps;Seated;Strengthening;Bar weights/barbell    Bar Weights/Barbell (Wrist Extension)  2 lbs    Wrist Radial Deviation  Left;10 reps;Seated;Strengthening;Bar weights/barbell    Bar Weights/Barbell (Radial Deviation)  2 lbs    Wrist Ulnar Deviation  Left;10 reps;Strengthening    Bar Weights/Barbell (Ulnar Deviation)  2 lbs    Wrist Ulnar Deviation Limitations  seated     Other wrist exercises  L wrist flexor & extensor stretch 2 x 30 sec each      Manual Therapy   Manual Therapy  Soft tissue mobilization;Myofascial release    Soft tissue mobilization  STM to L wrist flexor & extensor groups    Myofascial Release  manual TPR/MFR to L wrist extensor groups      Ankle Exercises: Aerobic   Nustep  L5 x 6 min (UE/LE)               PT Short Term Goals - 10/04/18 0941      PT SHORT TERM GOAL #1   Title  Independent with initial HEP    Status  Achieved        PT Long Term Goals - 10/18/18 1011      PT LONG TERM GOAL #1   Title  Independent with ongoing/advanced HEP    Status  Partially Met    Target Date  11/02/18      PT LONG TERM GOAL #2   Title  L ankle AROM WFL and essentially equilavent to L to allow for normal gait pattern    Status  Partially Met    Target Date  11/02/18      PT LONG TERM GOAL #3   Title  L LE strength >/= 4/5 for improved stability    Status  Partially Met    Target  Date  11/02/18      PT LONG TERM GOAL #4   Title  Patient will ambulate with normal gait pattern w/o L foot/ankle pain    Status  Partially Met    Target Date  11/02/18      PT LONG TERM GOAL #5   Title  Patient will report no limitaiton in standing or walking tolerance due to L foot or ankle pain to allow for return to work fullt-time    Status  On-going    Target Date  11/02/18      PT LONG TERM GOAL #6   Title  L wrist and forearm strength >/= 4+/5 and grip strength within 5# of R hand to allow for functional use of L hand    Status  On-going    Target Date  11/02/18      PT LONG TERM GOAL #7   Title  Patient to report ability to perform ADLs, household and work-related tasks without increased L wrist pain    Status  On-going    Target Date  11/02/18            Plan - 10/29/18 0955    Clinical Impression Statement  Mary [name] doing well today reporting improvement in L ankle/foot pain when walking.  Still having occasional L foot numbness.  Session focused on L wrist strengthening.  Pt. still with L wrist pain at low-reported level with radial deviation however tolerated progression of resistance with forearm, wrist, hand strengthening without issue today.  Verbalized she may be ready to consider returning to work in near future.  Ended visit pain free thus modalities deferred.    Personal Factors and Comorbidities  Profession;Comorbidity 2    Comorbidities  chronic LBP, HTN    Rehab Potential  Good    PT Treatment/Interventions  ADLs/Self Care Home Management;Cryotherapy;Electrical Stimulation;Iontophoresis 87m/ml Dexamethasone;Moist Heat;Ultrasound;Gait training;Stair training;Functional mobility training;Therapeutic activities;Therapeutic exercise;Balance training;Neuromuscular re-education;Patient/family education;Manual techniques;Passive range of motion;Dry needling;Taping;Vasopneumatic Device;Joint Manipulations    PT  Next Visit Plan  L ankle, wrist ROM and strengthening;  gait training    PT Home Exercise Plan  09/18/18 - Gastroc/soleus & plantar fascia stretches, APs & circles; 10/04/18 - standing versions of stretches, 4-way ankle TB exercises (red DF/PF, yellow IV/EV), toe curls with yellow TB; 10/11/18 - L wrist flexor/extensor stretches, L wrist eccentric strengthening; 10/25/18 - discontinued 9/10 exercises, added side-stepping with red TB at midfoot, progressed 4-way ankle to green TB, progressed heel/toe raises to standing with looped yellow TB at ankles to avoid medial collapse    Consulted and Agree with Plan of Care  Patient       Patient will benefit from skilled therapeutic intervention in order to improve the following deficits and impairments:  Abnormal gait, Decreased activity tolerance, Decreased balance, Decreased endurance, Decreased mobility, Decreased range of motion, Decreased strength, Difficulty walking, Hypomobility, Increased muscle spasms, Impaired perceived functional ability, Impaired flexibility, Impaired sensation, Improper body mechanics, Pain, Impaired UE functional use  Visit Diagnosis: Pain in left ankle and joints of left foot  Stiffness of left ankle, not elsewhere classified  Other abnormalities of gait and mobility  Muscle weakness (generalized)  Pain in left wrist     Problem List Patient Active Problem List   Diagnosis Date Noted  . Hypertension 07/30/2018  . Trigger finger, left middle finger 07/12/2018  . Common migraine 06/07/2017  . Chronic lower back pain 12/21/2015  . Postmenopausal 05/15/2015  . Hyperlipidemia 11/17/2014  . 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 Obesity 11/17/2014  . Knee pain, right 04/09/2013  . Constipation 07/08/2009    Bess Harvest, PTA 10/29/18 1:10 PM   [facility] [address]  [place], [place], [address] Phone: [phone]   Fax:  [phone]  Name: Mary [name] MRN: [id number] Date of Birth: 08-12-1965

## 2018-11-01 ENCOUNTER — Ambulatory Visit: Payer: PRIVATE HEALTH INSURANCE | Admitting: Physical Therapy

## 2018-11-01 ENCOUNTER — Encounter: Payer: Self-pay | Admitting: Physical Therapy

## 2018-11-01 ENCOUNTER — Other Ambulatory Visit: Payer: Self-pay

## 2018-11-01 DIAGNOSIS — M25532 Pain in left wrist: Secondary | ICD-10-CM

## 2018-11-01 DIAGNOSIS — R2689 Other abnormalities of gait and mobility: Secondary | ICD-10-CM

## 2018-11-01 DIAGNOSIS — M25672 Stiffness of left ankle, not elsewhere classified: Secondary | ICD-10-CM

## 2018-11-01 DIAGNOSIS — M6281 Muscle weakness (generalized): Secondary | ICD-10-CM

## 2018-11-01 DIAGNOSIS — M25572 Pain in left ankle and joints of left foot: Secondary | ICD-10-CM | POA: Diagnosis not present

## 2018-11-01 NOTE — Therapy (Addendum)
[facility] [address]  [place], [place], [address] Phone: [phone]   Fax:  [phone]  Physical Therapy Treatment / Discharge Summary  Patient Details  Name: Mary [name] MRN: [id number] Date of Birth: January 14, 1966 Referring Provider (PT): Radene Journey, MD   Encounter Date: 11/01/2018  PT End of Session - 11/01/18 0930    Visit Number  12    Number of Visits  12    Date for PT Re-Evaluation  11/02/18    Authorization Type  WC - 8 visits from 10/05/18 -11/02/18    Authorization - Visit Number  12    Authorization - Number of Visits  12    PT Start Time  0930    PT Stop Time  1020    PT Time Calculation (min)  50 min    Activity Tolerance  Patient tolerated treatment well    Behavior During Therapy  [facility] for tasks assessed/performed       Past Medical History:  Diagnosis Date  . Allergy   . Arthritis   . Headache(784.0)   . Hyperlipidemia     Past Surgical History:  Procedure Laterality Date  . CESAREAN SECTION      There were no vitals filed for this visit.  Subjective Assessment - 11/01/18 0935    Subjective  No pain today, but still notes some foot/ankle pain after prolonged standing or walking.    How long can you stand comfortably?  10 minutes    How long can you walk comfortably?  2 hrs    Patient Stated Goals  "be able to work normal shift (8 hrs) w/o pain"    Currently in Pain?  No/denies    Pain Score  0-No pain   up to 2/10   Pain Location  Foot    Pain Orientation  Left    Pain Frequency  Intermittent    Pain Score  0    Pain Location  Wrist    Pain Orientation  Left    Pain Frequency  Intermittent         [facility] PT Assessment - 11/01/18 0930      Assessment   Medical Diagnosis  L foot & ankle sprain; L wrist sprain    Referring Provider (PT)  Radene Journey, MD    Onset Date/Surgical Date  07/19/18    Hand Dominance  Right    Next MD Visit  11/02/18      AROM   Right  Ankle Dorsiflexion  10    Right Ankle Plantar Flexion  42    Right Ankle Inversion  26    Right Ankle Eversion  17    Left Ankle Dorsiflexion  10    Left Ankle Plantar Flexion  40    Left Ankle Inversion  24    Left Ankle Eversion  18      Strength   Left Forearm Pronation  4+/5    Left Forearm Supination  4+/5   mild pain on resistance   Left Wrist Flexion  4+/5   mild pain on resistance   Left Wrist Extension  5/5    Left Wrist Radial Deviation  5/5    Left Wrist Ulnar Deviation  4+/5   mild pain on resistance   Right Hand Grip (lbs)  24.67   24, 22, 28   Right Hand Lateral Pinch  15.67 lbs   16, 16, 15   Right Hand 3 Point  Pinch  16 lbs   14, 17, 17   Left Hand Grip (lbs)  21.33   20, 24, 20   Left Hand Lateral Pinch  13 lbs   13, 13, 13   Left Hand 3 Point Pinch  13 lbs   14, 13, 12   Right Hip Flexion  5/5    Right Hip Extension  4/5    Right Hip External Rotation   4+/5    Right Hip Internal Rotation  5/5    Right Hip ABduction  4/5    Right Hip ADduction  4/5    Left Hip Flexion  4+/5    Left Hip Extension  4-/5    Left Hip External Rotation  4/5    Left Hip Internal Rotation  4+/5    Left Hip ABduction  4-/5    Left Hip ADduction  4/5    Right Knee Flexion  4+/5    Right Knee Extension  5/5    Left Knee Flexion  4+/5    Left Knee Extension  4+/5    Right Ankle Dorsiflexion  4+/5    Right Ankle Plantar Flexion  4+/5    Right Ankle Inversion  4+/5    Right Ankle Eversion  4+/5    Left Ankle Dorsiflexion  4+/5    Left Ankle Plantar Flexion  4+/5    Left Ankle Inversion  4+/5    Left Ankle Eversion  4+/5                   [facility] Adult PT Treatment/Exercise - 11/01/18 0930      Ankle Exercises: Aerobic   Nustep  L5 x 6 min (UE/LE)               PT Short Term Goals - 10/04/18 0941      PT SHORT TERM GOAL #1   Title  Independent with initial HEP    Status  Achieved        PT Long Term Goals - 11/01/18 0941      PT LONG  TERM GOAL #1   Title  Independent with ongoing/advanced HEP    Status  Achieved   11/01/18     PT LONG TERM GOAL #2   Title  L ankle AROM WFL and essentially equilavent to L to allow for normal gait pattern    Status  Achieved   11/01/18     PT LONG TERM GOAL #3   Title  L LE strength >/= 4/5 for improved stability    Status  Partially Met      PT LONG TERM GOAL #4   Title  Patient will ambulate with normal gait pattern w/o L foot/ankle pain    Status  Achieved   11/01/18     PT LONG TERM GOAL #5   Title  Patient will report no limitaiton in standing or walking tolerance due to L foot or ankle pain to allow for return to work fullt-time    Status  Partially Met      PT LONG TERM GOAL #6   Title  L wrist and forearm strength >/= 4+/5 and grip strength within 5# of R hand to allow for functional use of L hand    Status  Achieved   11/01/18     PT LONG TERM GOAL #7   Title  Patient to report ability to perform ADLs, household and work-related tasks without increased L wrist pain    Status  Partially Met            Plan - 11/01/18 1008    Clinical Impression Statement  Mary [name] in L foot/ankle and 50% [name] in L wrist with PT. L foot/ankle ROM now symmetrical to R with improved overall B LE strength with only mild proximal L LE weakness still present. L forearm and wrist strength now at least 4+/5 and L grip strength now within <5# of R. Functionally she still notes limitation in static standing tolerance on L foot but can walk up to 2 hours without limitation. With her L wrist, her greatest ongoing issue is in her limited ability to push or bear weight on her medial palm due to pain. All LTGs met or at least partially met. Mary [name] hopeful to be allowed to return to work soon but would benefit from continued PT to address above ongoing deficits.    Comorbidities  chronic LBP, HTN    Rehab Potential  Good    PT Frequency  2x / week    PT  Treatment/Interventions  ADLs/Self Care Home Management;Cryotherapy;Electrical Stimulation;Iontophoresis 24m/ml Dexamethasone;Moist Heat;Ultrasound;Gait training;Stair training;Functional mobility training;Therapeutic activities;Therapeutic exercise;Balance training;Neuromuscular re-education;Patient/family education;Manual techniques;Passive range of motion;Dry needling;Taping;Vasopneumatic Device;Joint Manipulations    PT Next Visit Plan  L LE strengthening; pain management for L wrist and ankle/foot pending further WC authorization    PT Home Exercise Plan  09/18/18 - Gastroc/soleus & plantar fascia stretches, APs & circles; 10/04/18 - standing versions of stretches, 4-way ankle TB exercises (red DF/PF, yellow IV/EV), toe curls with yellow TB; 10/11/18 - L wrist flexor/extensor stretches, L wrist eccentric strengthening; 10/25/18 - discontinued 9/10 exercises, added side-stepping with red TB at midfoot, progressed 4-way ankle to green TB, progressed heel/toe raises to standing with looped yellow TB at ankles to avoid medial collapse    Consulted and Agree with Plan of Care  Patient       Patient will benefit from skilled therapeutic intervention in order to improve the following deficits and impairments:  Abnormal gait, Decreased activity tolerance, Decreased balance, Decreased endurance, Decreased mobility, Decreased range of motion, Decreased strength, Difficulty walking, Hypomobility, Increased muscle spasms, Impaired perceived functional ability, Impaired flexibility, Impaired sensation, Improper body mechanics, Pain, Impaired UE functional use  Visit Diagnosis: Pain in left ankle and joints of left foot  Stiffness of left ankle, not elsewhere classified  Other abnormalities of gait and mobility  Muscle weakness (generalized)  Pain in left wrist     Problem List Patient Active Problem List   Diagnosis Date Noted  . Hypertension 07/30/2018  . Trigger finger, left middle finger 07/12/2018   . Common migraine 06/07/2017  . Chronic lower back pain 12/21/2015  . Postmenopausal 05/15/2015  . Hyperlipidemia 11/17/2014  . 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 Obesity 11/17/2014  . Knee pain, right 04/09/2013  . Constipation 07/08/2009    Mary [name] PT, MPT 11/01/2018, 6:11 PM  [facility] [place] [address]Phone: [phone]  Fax:  [phone] Name: JLIVIER HENDELMRN: [id number] of Birth: [birth year]  PHYSICAL THERAPY DISCHARGE SUMMARY  Visits from Start of Care: 12  Current functional level related to goals / functional outcomes:   Refer to above clinical impression for status as of last visit on 11/01/18. As of last MD follow up on 11/02/18, patient was to be referred for work conditioning PT and has not returned to this clinic, therefore will proceed  with discharge from PT for this episode.   Remaining deficits:   As above.   Education / Equipment:   HEP  Plan: Patient agrees to discharge.  Patient goals were partially met. Patient is being discharged due to                                                    referred for work conditioning PT. ?????     Percival [name], PT, MPT 12/10/18, 8:57 AM  [facility] [address]  [place], [place], [address] Phone: [phone]   Fax:  [phone]

## 2018-11-02 MED FILL — DICLOFENAC SODIUM 1 % GEL: 1 | 16 days supply | Qty: 100 | Fill #0

## 2018-11-02 MED FILL — tiZANidine HCL 4 MG TABS: 4 | 30 days supply | Qty: 30 | Fill #0

## 2018-11-05 ENCOUNTER — Ambulatory Visit: Payer: PRIVATE HEALTH INSURANCE | Admitting: Physical Therapy

## 2018-11-08 ENCOUNTER — Encounter: Payer: 59 | Admitting: Physical Therapy

## 2018-11-15 ENCOUNTER — Encounter: Payer: 59 | Admitting: Physical Therapy

## 2018-11-15 MED FILL — ATORVASTATIN 10 MG TABLET: 10 | 90 days supply | Qty: 90 | Fill #0

## 2018-11-15 MED FILL — PROPRANOLOL 60 MG TABLET: 60 | 30 days supply | Qty: 60 | Fill #2

## 2019-01-07 ENCOUNTER — Ambulatory Visit (INDEPENDENT_AMBULATORY_CARE_PROVIDER_SITE_OTHER): Payer: 59 | Admitting: Internal Medicine

## 2019-01-07 ENCOUNTER — Other Ambulatory Visit: Payer: Self-pay

## 2019-01-07 ENCOUNTER — Encounter: Payer: Self-pay | Admitting: Internal Medicine

## 2019-01-07 DIAGNOSIS — M549 Dorsalgia, unspecified: Secondary | ICD-10-CM

## 2019-01-07 MED ORDER — MELOXICAM 15 MG PO TABS: 15.0000 mg | ORAL_TABLET | Freq: Every day | ORAL | 0 refills | Status: DC | PRN

## 2019-01-07 MED ORDER — CYCLOBENZAPRINE HCL 10 MG PO TABS: 5.0000 mg | ORAL_TABLET | Freq: Three times a day (TID) | ORAL | 1 refills | Status: DC | PRN

## 2019-01-07 MED FILL — CYCLOBENZAPRINE HCL 10 MG T: 10 | 10 days supply | Qty: 30 | Fill #0

## 2019-01-07 MED FILL — MELOXICAM 15 MG TABLET: 15 | 90 days supply | Qty: 90 | Fill #0

## 2019-01-07 NOTE — Assessment & Plan Note (Signed)
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

## 2019-01-07 NOTE — Patient Instructions (Addendum)
Your back pain is likely muscular in nature.    Take the medication prescribed, apply heat and stretching gently.     You can apply topical muscle medications such as salon pas, biofreeze, bengay.    Please call if there is no improvement in your symptoms.

## 2019-01-07 NOTE — Progress Notes (Signed)
Subjective:    Patient ID: Mary [name], female    DOB: Nov 08, 1965, 53 y.o.   MRN: [id number]  HPI The patient is here for an acute visit.  She woke up with back pain three days ago.  She does not recall doing anything specific that caused it.  She does have a very physical job.  She took Tylenol and the pain did get better.  She went to work and it got worse.  She took Tylenol again and it again improve the pain, but had to continue to work the rest of the day.  The pain got worse and by the evening she was having difficulty walking and was not able to get to sleep.  She had intense pain initially in the left lower back and it now is across the entire back and radiates up to her middle back.  She also has some discomfort in her upper back and neck.  She denies any radiation into the legs.  On occasion she will experience some stiffness in the posterior left knee.  Tylenol helps.  She does have a lot of muscle tightness in the back.  She did have some methocarbamol and meloxicam leftover and started taking that over the weekend and that has helped.  She is almost run out of that medication.  Heat helps temporarily.  This is not a new issue this has been a recurrent issue because of her job.     BP was good three days ago at home.   Medications and allergies reviewed with patient and updated if appropriate.  Patient Active Problem List   Diagnosis Date Noted  . Hypertension 07/30/2018  . Trigger finger, left middle finger 07/12/2018  . Common migraine 06/07/2017  . Chronic lower back pain 12/21/2015  . Postmenopausal 05/15/2015  . Hyperlipidemia 11/17/2014  . 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 Obesity 11/17/2014  . Knee pain, right 04/09/2013  . Constipation 07/08/2009    Current Outpatient Medications on File Prior to Visit  Medication Sig Dispense Refill  . Aspirin-Acetaminophen-Caffeine (EXCEDRIN MIGRAINE PO) Take by mouth as needed.    Marland Kitchen atorvastatin (LIPITOR) 10 MG tablet Take 1  tablet (10 mg total) by mouth daily. 90 tablet 1  . Calcium Carbonate-Vitamin D (CALCIUM 600+D3) 600-400 MG-UNIT per tablet Take 1 tablet by mouth daily.    . cyclobenzaprine (FLEXERIL) 10 MG tablet Take 0.5-1 tablets (5-10 mg total) by mouth at bedtime as needed for muscle spasms. 30 tablet 1  . diclofenac sodium (VOLTAREN) 1 % GEL     . ibuprofen (ADVIL) 800 MG tablet Take 1 tablet (800 mg total) by mouth 3 (three) times daily. 21 tablet 0  . meloxicam (MOBIC) 15 MG tablet Take 1 tablet (15 mg total) by mouth daily as needed. 90 tablet 0  . methocarbamol (ROBAXIN) 500 MG tablet     . Multiple Vitamin (MULTIVITAMIN) tablet Take 1 tablet by mouth daily.    . propranolol (INDERAL) 60 MG tablet Take 1 tablet (60 mg total) by mouth 2 (two) times daily. 60 tablet 5   No current facility-administered medications on file prior to visit.    Past Medical History:  Diagnosis Date  . Allergy   . Arthritis   . Headache(784.0)   . Hyperlipidemia     Past Surgical History:  Procedure Laterality Date  . CESAREAN SECTION      Social History   Socioeconomic History  . Marital status: Widowed    Spouse name: [name]  .  Number of children: 2  . Years of education: 24  . Highest education level: Not on file  Occupational History    Employer: [employer]  Tobacco Use  . Smoking status: Never Smoker  . Smokeless tobacco: Never Used  Substance and Sexual Activity  . Alcohol use: No  . Drug use: No  . Sexual activity: Yes  Other Topics Concern  . Not on file  Social History Narrative   Patient is right handed, resides with husband([name]),two children in a home.      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 Difficulty of Paying Living Expenses: Not on file  Food Insecurity:   . Worried About Charity fundraiser in the Last Year: Not on file  . Ran Out of Food in the Last Year: Not on file  Transportation Needs:   . Lack of  Transportation (Medical): Not on file  . Lack of Transportation (Non-Medical): Not on file  Physical Activity:   . Days of Exercise per Week: Not on file  . Minutes of Exercise per Session: Not on file  Stress:   . Feeling of Stress : Not on file  Social Connections:   . Frequency of Communication with Friends and Family: Not on file  . Frequency of Social Gatherings with Friends and Family: Not on file  . Attends Religious Services: Not on file  . Active Member of Clubs or Organizations: Not on file  . Attends Archivist Meetings: Not on file  . Marital Status: Not on file    Family History  Problem Relation Age of Onset  . Migraines Mother   . Colon cancer Neg Hx   . Esophageal cancer Neg Hx   . Rectal cancer Neg Hx   . Stomach cancer Neg Hx   . Colon polyps Neg Hx     Review of Systems  Constitutional: Negative for fever.  Musculoskeletal: Positive for back pain, myalgias, neck pain and neck stiffness.  Neurological: Negative for weakness and numbness.       Objective:   Vitals:   01/07/19 1105  BP: (!) 162/100  Pulse: (!) 57  Resp: 16  Temp: 99.4 F (37.4 C)  SpO2: 99%   BP Readings from Last 3 Encounters:  01/07/19 (!) 162/100  08/29/18 119/82  08/20/18 128/78   Wt Readings from Last 3 Encounters:  01/07/19 198 lb (89.8 kg)  08/29/18 191 lb (86.6 kg)  08/22/18 190 lb (86.2 kg)   Body mass index is 31.96 kg/m.   Physical Exam Constitutional:      General: She is not in acute distress.    Appearance: Normal appearance. She is not ill-appearing.  HENT:     Head: Normocephalic and atraumatic.  Musculoskeletal:        General: Tenderness (Across lower back up left side of back, upper back into the neck-all muscular in nature) present. No swelling or deformity.  Skin:    General: Skin is warm and dry.     Findings: No erythema.  Neurological:     General: No focal deficit present.     Mental Status: She is alert.     Sensory: No sensory  deficit.     Motor: No weakness.            Assessment & Plan:    See Problem List for Assessment and Plan of chronic medical problems.    This  visit occurred during the SARS-CoV-2 public health emergency.  Safety protocols were in place, including screening questions prior to the visit, additional usage of staff PPE, and extensive cleaning of exam room while observing appropriate contact time as indicated for disinfecting solutions.

## 2019-01-21 MED FILL — PROPRANOLOL 60 MG TABLET: 60 | 30 days supply | Qty: 60 | Fill #3

## 2019-01-29 NOTE — Progress Notes (Signed)
Subjective:    Patient ID: Mary [name], female    DOB: 1965/07/26, 54 y.o.   MRN: [id number]  HPI The patient is here for follow up of their chronic medical problems, including hypertension, hyperlipidemia, musculoskeletal back pain.  She is taking all of her medications as prescribed.    She is very active with work.    She was here last month for her back pain - it has improved. She is taking the muscle relaxer and meloxicam.  She takes the muscle relaxer at night.  She is still having pain.  She has not seen anyone for back pain in the past.      Medications and allergies reviewed with patient and updated if appropriate.  Patient Active Problem List   Diagnosis Date Noted  . Hypertension 07/30/2018  . Trigger finger, left middle finger 07/12/2018  . Musculoskeletal back pain 12/01/2017  . Common migraine 06/07/2017  . Chronic lower back pain 12/21/2015  . Postmenopausal 05/15/2015  . Hyperlipidemia 11/17/2014  . 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 Obesity 11/17/2014  . Knee pain, right 04/09/2013  . Constipation 07/08/2009    Current Outpatient Medications on File Prior to Visit  Medication Sig Dispense Refill  . Aspirin-Acetaminophen-Caffeine (EXCEDRIN MIGRAINE PO) Take by mouth as needed.    Marland Kitchen atorvastatin (LIPITOR) 10 MG tablet Take 1 tablet (10 mg total) by mouth daily. 90 tablet 1  . Calcium Carbonate-Vitamin D (CALCIUM 600+D3) 600-400 MG-UNIT per tablet Take 1 tablet by mouth daily.    . cyclobenzaprine (FLEXERIL) 10 MG tablet Take 0.5-1 tablets (5-10 mg total) by mouth 3 (three) times daily as needed for muscle spasms. 30 tablet 1  . diclofenac sodium (VOLTAREN) 1 % GEL     . meloxicam (MOBIC) 15 MG tablet Take 1 tablet (15 mg total) by mouth daily as needed. 90 tablet 0  . Multiple Vitamin (MULTIVITAMIN) tablet Take 1 tablet by mouth daily.    . propranolol (INDERAL) 60 MG tablet Take 1 tablet (60 mg total) by mouth 2 (two) times daily. 60 tablet 5   No current  facility-administered medications on file prior to visit.    Past Medical History:  Diagnosis Date  . Allergy   . Arthritis   . Headache(784.0)   . Hyperlipidemia     Past Surgical History:  Procedure Laterality Date  . CESAREAN SECTION      Social History   Socioeconomic History  . Marital status: Widowed    Spouse name: [name]  . Number of children: 2  . Years of education: 79  . Highest education level: Not on file  Occupational History    Employer: [employer]  Tobacco Use  . Smoking status: Never Smoker  . Smokeless tobacco: Never Used  Substance and Sexual Activity  . Alcohol use: No  . Drug use: No  . Sexual activity: Yes  Other Topics Concern  . Not on file  Social History Narrative   Patient is right handed, resides with husband([name]),two children in a home.      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 Difficulty of Paying Living Expenses: Not on file  Food Insecurity:   . Worried About Charity fundraiser in the Last Year: Not on file  . Ran Out of Food in the Last Year: Not on file  Transportation Needs:   . Lack of Transportation (Medical): Not on file  . Lack  of Transportation (Non-Medical): Not on file  Physical Activity:   . Days of Exercise per Week: Not on file  . Minutes of Exercise per Session: Not on file  Stress:   . Feeling of Stress : Not on file  Social Connections:   . Frequency of Communication with Friends and Family: Not on file  . Frequency of Social Gatherings with Friends and Family: Not on file  . Attends Religious Services: Not on file  . Active Member of Clubs or Organizations: Not on file  . Attends Archivist Meetings: Not on file  . Marital Status: Not on file    Family History  Problem Relation Age of Onset  . Migraines Mother   . Colon cancer Neg Hx   . Esophageal cancer Neg Hx   . Rectal cancer Neg Hx   . Stomach cancer Neg Hx   . Colon polyps  Neg Hx     Review of Systems  Constitutional: Negative for chills and fever.  Respiratory: Negative for cough, shortness of breath and wheezing.   Cardiovascular: Positive for leg swelling (after hours of being on feet). Negative for chest pain and palpitations.  Musculoskeletal: Positive for back pain.  Neurological: Positive for headaches. Negative for light-headedness.       Objective:   Vitals:   01/30/19 0902  BP: (!) 146/92  Pulse: (!) 56  Resp: 16  Temp: 97.7 F (36.5 C)  SpO2: 99%   BP Readings from Last 3 Encounters:  01/30/19 (!) 146/92  01/07/19 (!) 162/100  08/29/18 119/82   Wt Readings from Last 3 Encounters:  01/30/19 196 lb (88.9 kg)  01/07/19 198 lb (89.8 kg)  08/29/18 191 lb (86.6 kg)   Body mass index is 31.64 kg/m.   Physical Exam    Constitutional: Appears well-developed and well-nourished. No distress.  HENT:  Head: Normocephalic and atraumatic.  Neck: Neck supple. No tracheal deviation present. No thyromegaly present.  No cervical lymphadenopathy Cardiovascular: Normal rate, regular rhythm and normal heart sounds.   No murmur heard. No carotid bruit .  No edema Pulmonary/Chest: Effort normal and breath sounds normal. No respiratory distress. No has no wheezes. No rales.  Skin: Skin is warm and dry. Not diaphoretic.  Psychiatric: Normal mood and affect. Behavior is normal.      Assessment & Plan:    See Problem List for Assessment and Plan of chronic medical problems.    This visit occurred during the SARS-CoV-2 public health emergency.  Safety protocols were in place, including screening questions prior to the visit, additional usage of staff PPE, and extensive cleaning of exam room while observing appropriate contact time as indicated for disinfecting solutions.

## 2019-01-29 NOTE — Patient Instructions (Addendum)
  Blood work was ordered.     Medications reviewed and updated.  Changes include :   Start losartan 25 mg daily for your blood pressure.    Your prescription(s) have been submitted to your pharmacy. Please take as directed and contact our office if you believe you are having problem(s) with the medication(s).    Please followup in 6 months

## 2019-01-30 ENCOUNTER — Encounter: Payer: Self-pay | Admitting: Family Medicine

## 2019-01-30 ENCOUNTER — Encounter: Payer: Self-pay | Admitting: Internal Medicine

## 2019-01-30 ENCOUNTER — Other Ambulatory Visit: Payer: Self-pay

## 2019-01-30 ENCOUNTER — Ambulatory Visit (INDEPENDENT_AMBULATORY_CARE_PROVIDER_SITE_OTHER): Payer: 59 | Admitting: Family Medicine

## 2019-01-30 ENCOUNTER — Ambulatory Visit (INDEPENDENT_AMBULATORY_CARE_PROVIDER_SITE_OTHER): Payer: 59 | Admitting: Internal Medicine

## 2019-01-30 VITALS — BP 146/92 | HR 56 | Temp 97.7°F | Resp 16 | Ht 66.0 in | Wt 196.0 lb

## 2019-01-30 VITALS — BP 146/92 | HR 56 | Ht 66.0 in | Wt 196.0 lb

## 2019-01-30 DIAGNOSIS — E782 Mixed hyperlipidemia: Secondary | ICD-10-CM | POA: Diagnosis not present

## 2019-01-30 DIAGNOSIS — S39012A Strain of muscle, fascia and tendon of lower back, initial encounter: Secondary | ICD-10-CM | POA: Diagnosis not present

## 2019-01-30 DIAGNOSIS — I10 Essential (primary) hypertension: Secondary | ICD-10-CM | POA: Diagnosis not present

## 2019-01-30 DIAGNOSIS — G43009 Migraine without aura, not intractable, without status migrainosus: Secondary | ICD-10-CM

## 2019-01-30 DIAGNOSIS — M549 Dorsalgia, unspecified: Secondary | ICD-10-CM | POA: Diagnosis not present

## 2019-01-30 LAB — LIPID PANEL
Cholesterol: 165 mg/dL (ref 0–200)
HDL: 74.3 mg/dL (ref >39.00)
LDL Cholesterol: 80 mg/dL (ref 0–99)
NonHDL: 90.45
Total CHOL/HDL Ratio: 2
Triglycerides: 50 mg/dL (ref 0.0–149.0)
VLDL: 10 mg/dL (ref 0.0–40.0)

## 2019-01-30 LAB — COMPREHENSIVE METABOLIC PANEL
ALT: 12 U/L (ref 0–35)
AST: 16 U/L (ref 0–37)
Albumin: 4 g/dL (ref 3.5–5.2)
Alkaline Phosphatase: 75 U/L (ref 39–117)
BUN: 14 mg/dL (ref 6–23)
CO2: 30 mEq/L (ref 19–32)
Calcium: 9.5 mg/dL (ref 8.4–10.5)
Chloride: 102 mEq/L (ref 96–112)
Creatinine, Ser: 0.88 mg/dL (ref 0.40–1.20)
GFR: 81.79 mL/min (ref >60.00)
Glucose, Bld: 85 mg/dL (ref 70–99)
Potassium: 4.2 mEq/L (ref 3.5–5.1)
Sodium: 138 mEq/L (ref 135–145)
Total Bilirubin: 0.9 mg/dL (ref 0.2–1.2)
Total Protein: 7.7 g/dL (ref 6.0–8.3)

## 2019-01-30 MED ORDER — CYCLOBENZAPRINE HCL 10 MG PO TABS: 10.0000 mg | ORAL_TABLET | Freq: Every day | ORAL | 3 refills | Status: DC

## 2019-01-30 MED FILL — CYCLOBENZAPRINE HCL 10 MG T: 10 | 30 days supply | Qty: 30 | Fill #0

## 2019-01-30 NOTE — Patient Instructions (Addendum)
Thank you for coming in today. Attend PT.  Use heating pad and TENS unit.   Come back or go to the emergency room if you notice new weakness new numbness problems walking or bowel or bladder problems.   TENS UNIT: This is helpful for muscle pain and spasm.   Search and Purchase a TENS 7000 2nd edition at  www.tenspros.com or www.[employer].com It should be less than $30.     TENS unit instructions: Do not shower or bathe with the unit on Turn the unit off before removing electrodes or batteries If the electrodes lose stickiness add a drop of water to the electrodes after they are disconnected from the unit and place on plastic sheet. If you continued to have difficulty, call the TENS unit company to purchase more electrodes. Do not apply lotion on the skin area prior to use. Make sure the skin is clean and dry as this will help prolong the life of the electrodes. After use, always check skin for unusual red areas, rash or other skin difficulties. If there are any skin problems, does not apply electrodes to the same area. Never remove the electrodes from the unit by pulling the wires. Do not use the TENS unit or electrodes other than as directed. Do not change electrode placement without consultating your therapist or physician. Keep 2 fingers with between each electrode. Wear time ratio is 2:1, on to off times.    For example on for 30 minutes off for 15 minutes and then on for 30 minutes off for 15 minutes     Lumbosacral Strain Lumbosacral strain is an injury that causes pain in the lower back (lumbosacral spine). This injury usually happens from overstretching the muscles or ligaments along your spine. Ligaments are cord-like tissues that connect bones to other bones. A strain can affect one or more muscles or ligaments. What are the causes? This condition may be caused by:  A hard, direct hit to the back.  Overstretching the lower back muscles. This may result from: ? A  fall. ? Lifting something heavy. ? Repetitive movements such as bending or crouching. What increases the risk? The following factors may make you more likely to develop this condition:  Participating in sports or activities that involve: ? A sudden twist of the back. ? Pushing or pulling motions.  Being overweight or obese.  Having poor strength and flexibility, especially tight hamstrings or weak muscles in the back or abdomen.  Having too much of a curve in the lower back.  Having a pelvis that is tilted forward. What are the signs or symptoms? The main symptom of this condition is pain in the lower back, at the site of the strain. Pain may also be felt down one or both legs. How is this diagnosed? This condition is diagnosed based on your symptoms, your medical history, and a physical exam. During the physical exam, your health care provider may push on certain areas of your back to find the source of your pain. You may be asked to bend forward, backward, and side to side to check your pain and range of motion. You may also have imaging tests, such as X-rays and an MRI. How is this treated? This condition may be treated by:  Applying heat and cold on the affected area.  Taking medicines to help relieve pain and relax your muscles.  Taking NSAIDs, such as ibuprofen, to help reduce swelling and discomfort.  Doing stretching and strengthening exercises for your lower  back. Symptoms usually improve within several weeks of treatment. However, recovery time varies. When your symptoms improve, gradually return to your normal routine as soon as possible to reduce pain, avoid stiffness, and keep muscle strength. Follow these instructions at home: Medicines  Take over-the-counter and prescription medicines only as told by your health care provider.  Ask your health care provider if the medicine prescribed to you: ? Requires you to avoid driving or using heavy machinery. ? Can cause  constipation. You may need to take these actions to prevent or treat constipation:  Drink enough fluid to keep your urine pale yellow.  Take over-the-counter or prescription medicines.  Eat foods that are high in fiber, such as beans, whole grains, and fresh fruits and vegetables.  Limit foods that are high in fat and processed sugars, such as fried or sweet foods. Managing pain, stiffness, and swelling      If directed, put ice on the injured area. To do this: ? Put ice in a plastic bag. ? Place a towel between your skin and the bag. ? Leave the ice on for 20 minutes, 2-3 times a day.  If directed, apply heat on the affected area as often as told by your health care provider. Use the heat source that your health care provider recommends, such as a moist heat pack or a heating pad. ? Place a towel between your skin and the heat source. ? Leave the heat on for 20-30 minutes. ? Remove the heat if your skin turns bright red. This is especially important if you are unable to feel pain, heat, or cold. You may have a greater risk of getting burned. Activity  Rest as told by your health care provider.  Do not stay in bed. Staying in bed for more than 1-2 days can delay your recovery.  Return to your normal activities as told by your health care provider. Ask your health care provider what activities are safe for you.  Avoid activities that take a lot of energy for as long as told by your health care provider.  Do exercises as told by your health care provider. This includes stretching and strengthening exercises. General instructions  Sit up and stand up straight. Avoid leaning forward when you sit, or hunching over when you stand.  Do not use any products that contain nicotine or tobacco, such as cigarettes, e-cigarettes, and chewing tobacco. If you need help quitting, ask your health care provider.  Keep all follow-up visits as told by your health care provider. This is  important. How is this prevented?   Use correct form when playing sports and lifting heavy objects.  Use good posture when sitting and standing.  Maintain a healthy weight.  Sleep on a mattress with medium firmness to support your back.  Do at least 150 minutes of moderate-intensity exercise each week, such as brisk walking or water aerobics. Try a form of exercise that takes stress off your back, such as swimming or stationary cycling.  Maintain physical fitness, including: ? Strength. ? Flexibility. Contact a health care provider if:  Your back pain does not improve after several weeks of treatment.  Your symptoms get worse. Get help right away if:  Your back pain is severe.  You cannot stand or walk.  You have difficulty controlling when you urinate or when you have a bowel movement.  You feel nauseous or you vomit.  Your feet or legs get very cold, turn pale, or look blue.  You  have numbness, tingling, weakness, or problems using your arms or legs.  You develop any of the following: ? Shortness of breath. ? Dizziness. ? Pain in your legs. ? Weakness in your buttocks or legs. Summary  Lumbosacral strain is an injury that causes pain in the lower back (lumbosacral spine).  This injury usually happens from overstretching the muscles or ligaments along your spine.  This condition may be caused by a direct hit to the lower back or by overstretching the lower back muscles.  Symptoms usually improve within several weeks of treatment. This information is not intended to replace advice given to you by your health care provider. Make sure you discuss any questions you have with your health care provider. Document Revised: 05/29/2018 Document Reviewed: 05/29/2018 Elsevier Patient Education  [place].

## 2019-01-30 NOTE — Assessment & Plan Note (Signed)
Chronic, intermittent Improved since she was here last month Taking meloxicam daily and Flexeril at night Will refer to sports medicine for further evaluation-she may benefit from exercises/physical therapy Advised to only take the medication as needed-ideally we want to avoid daily use

## 2019-01-30 NOTE — Assessment & Plan Note (Signed)
Taking propranolol once a day only Migraines controlled Takes excedrin migraine prn

## 2019-01-30 NOTE — Assessment & Plan Note (Signed)
Chronic Check lipid panel  Continue daily statin Regular exercise and healthy diet encouraged  

## 2019-01-30 NOTE — Progress Notes (Signed)
Subjective:    I'm seeing this patient as a consultation for:  Dr. Quay Burow. Note will be routed back to referring provider/PCP.  CC: Low back pain  I, Molly Weber, LAT, ATC, am serving as scribe for Dr. Lynne Leader.  HPI: Pt is a 54 y/o female presenting w/ c/o low back pain x 3 years that has been worsening over the past 3 weeks.  She has seen her PCP for this previously including an appt earlier today and was referred to Sports Medicine.  She rates her pain at a 3/10.  She has occasional radiating pain into the L post-lat thigh.  She denies any numbness/tingling in her B LEs.  Aggravating factors include prolonged sitting and laying and spinal flexion.  She has tried Meloxicam and a muscle relaxer for her symptoms which do help alleviate her pain.    Past medical history, Surgical history, Family history, Social history, Allergies, and medications have been entered into the medical record, reviewed.   Review of Systems: No new headache, visual changes, nausea, vomiting, diarrhea, constipation, dizziness, abdominal pain, skin rash, fevers, chills, night sweats, weight loss, swollen lymph nodes, body aches, joint swelling, muscle aches, chest pain, shortness of breath, mood changes, visual or auditory hallucinations.   Objective:    Vitals:   01/30/19 0957  BP: (!) 146/92  Pulse: (!) 56  SpO2: 99%   General: Well Developed, well nourished, and in no acute distress.  Neuro/Psych: Alert and oriented x3, extra-ocular muscles intact, able to move all 4 extremities, sensation grossly intact. Skin: Warm and dry, no rashes noted.  Respiratory: Not using accessory muscles, speaking in full sentences, trachea midline.  Cardiovascular: Pulses palpable, no extremity edema. Abdomen: Does not appear distended. MSK:  L-spine: Normal-appearing Range of motion: Limited rotation lateral flexion bilaterally.  Normal extension and flexion. Nontender spinal midline. Mildly tender palpation left lumbar  paraspinal musculature. Lower extremity strength reflexes and sensation are equal and throughout bilateral extremities. Normal gait.  Normal  Lab and Radiology Results  EXAM: LUMBAR SPINE - COMPLETE 4+ VIEW date of service 02/21/2017  COMPARISON:  None in PACs  FINDINGS: The lumbar vertebral bodies are preserved in height. The pedicles and transverse processes are intact. There is mild disc space narrowing at L1-2. There is no spondylolisthesis. There is mild facet joint hypertrophy at L5-S1.  IMPRESSION: Mild degenerative disc disease at L1-2 and facet joint hypertrophy at L5-S1. No compression fracture or spondylolisthesis.   Electronically Signed   By: David  Martinique M.D.   On: 02/21/2017 10:29 I, Lynne Leader, personally (independently) visualized and performed the interpretation of the images attached in this note.   Impression and Recommendations:    Assessment and Plan: 54 y.o. female with exacerbation of mild chronic low back pain.  Patient has degenerative changes seen on x-ray dated February 2019 mostly present at L5-S1.  This corresponds to the level of her pain.  Current pain today seems to be however mostly due to lumbosacral strain myofascial dysfunction and spasm.  She has had trial of typical conservative management already with meloxicam, Tylenol, and Flexeril.  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  We will proceed with trial of physical therapy along with heating pad and TENS unit and medications as above.  Check back in 1 month.  Return sooner if needed..   Orders Placed This Encounter  Procedures  . Ambulatory referral to Physical Therapy    Referral Priority:  Routine    Referral Type:   Physical Medicine    Referral Reason:   Specialty Services Required    Requested Specialty:   Physical Therapy   No orders of the defined types were placed in this encounter.   Discussed warning  signs or symptoms. Please see discharge instructions. Patient expresses understanding.  The above documentation has been reviewed and is accurate and complete Lynne Leader

## 2019-01-30 NOTE — Assessment & Plan Note (Addendum)
Chronic BP not controlled Start losartan 25 mg Continue propranolol at current dose-taking prophylactically for migraines and taking only once a day cmp Low sodium  diet Exercise  Weight loss advised

## 2019-01-31 MED ORDER — LOSARTAN POTASSIUM 25 MG PO TABS: 25.0000 mg | ORAL_TABLET | Freq: Every day | ORAL | 5 refills | Status: DC

## 2019-01-31 MED FILL — LOSARTAN POTASSIUM 25 MG TA: 25 | 30 days supply | Qty: 30 | Fill #0

## 2019-02-04 ENCOUNTER — Other Ambulatory Visit: Payer: Self-pay

## 2019-02-04 ENCOUNTER — Ambulatory Visit: Payer: 59 | Attending: Family Medicine | Admitting: Physical Therapy

## 2019-02-04 DIAGNOSIS — G8929 Other chronic pain: Secondary | ICD-10-CM | POA: Insufficient documentation

## 2019-02-04 DIAGNOSIS — M545 Low back pain, unspecified: Secondary | ICD-10-CM

## 2019-02-04 DIAGNOSIS — M6281 Muscle weakness (generalized): Secondary | ICD-10-CM | POA: Diagnosis not present

## 2019-02-04 DIAGNOSIS — R29898 Other symptoms and signs involving the musculoskeletal system: Secondary | ICD-10-CM | POA: Insufficient documentation

## 2019-02-04 DIAGNOSIS — R293 Abnormal posture: Secondary | ICD-10-CM | POA: Insufficient documentation

## 2019-02-04 NOTE — Therapy (Signed)
[facility] [place] [address]  [place], [place], [address] Phone: [phone]   Fax:  [phone]  Physical Therapy Evaluation  Patient Details  Name: Mary [name] MRN: [id number] Date of Birth: 1965-08-28 Referring Provider (PT): Gregor Hams, MD   Encounter Date: 02/04/2019  PT End of Session - 02/04/19 0847    Visit Number  1    Number of Visits  12    Date for PT Re-Evaluation  03/18/19    Authorization Type  [facility] - VL: [place]    PT Start Time  [id number]    PT Stop Time  0941    PT Time Calculation (min)  54 min    Activity Tolerance  Patient tolerated treatment well    Behavior During Therapy  [facility] for tasks assessed/performed       Past Medical History:  Diagnosis Date  . Allergy   . Arthritis   . Headache(784.0)   . Hyperlipidemia     Past Surgical History:  Procedure Laterality Date  . CESAREAN SECTION      There were no vitals filed for this visit.   Subjective Assessment - 02/04/19 0850    Subjective  Pt reports "off and on" back pain progressively worsening over past month.    Limitations  Sitting    How long can you sit comfortably?  pain upon sit to stand after prolonged sitting    Diagnostic tests  Lumbar x-ray 03/07/18: Mild degenerative disc disease at L1-2 and facet joint hypertrophy at L5-S1. No compression fracture or spondylolisthesis.    Patient Stated Goals  "I want to be able to do exercise or work w/o pain all the time."    Currently in Pain?  Yes    Pain Score  2     Pain Location  Back    Pain Orientation  Lower;Right;Left    Pain Descriptors / Indicators  Other (Comment);Tightness;Numbness;Tingling   "stiff"   Pain Type  Acute pain    Pain Radiating Towards  B buttocks    Pain Onset  More than a month ago   ~1 month   Pain Frequency  Intermittent    Aggravating Factors   bending over, prolonged sitting or lying  (stiff upon getting up), sit <> stand transitions, repositioning in  bed & getting in/out of bed    Pain Relieving Factors  heating pad, hot shower, walking or moving around    Effect of Pain on Daily Activities  trouble bending over to pick something up from floor, increased discomfort with sit <> stand, difficulty repositioning in bed and getting in/out of bed         [facility] PT Assessment - 02/04/19 0847      Assessment   Medical Diagnosis  Lumbosacral sprain    Referring Provider (PT)  Gregor Hams, MD    Onset Date/Surgical Date  --   ~1 month   Hand Dominance  Right    Next MD Visit  03/06/19    Prior Therapy  PT 09/2018 -10/2018 for L foot & ankle sprain, L wrist sprain       Precautions   Precautions  None      Balance Screen   Has the patient fallen in the past 6 months  Yes    How many times?  1    Has the patient had a decrease in activity level because of a fear of falling?   No  Is the patient reluctant to leave their home because of a fear of falling?   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      Cognition   Overall Cognitive Status  Within Functional Limits for tasks assessed      Observation/Other Assessments   Focus on Therapeutic Outcomes (FOTO)   Lumbar - 59% (41% limitation); Predicted 68% (32% limitation)      Posture/Postural Control   Posture/Postural Control  Postural limitations    Postural Limitations  Increased lumbar lordosis;Anterior pelvic tilt      ROM / Strength   AROM / PROM / Strength  AROM;Strength      AROM   AROM Assessment Site  Lumbar    Lumbar Flexion  WFL - finger tips to floor    Lumbar Extension  25% limited    Lumbar - Right Side Bend  WFL   pain in L low back   Lumbar -  Left Side Bend  WFL   pain in L low back   Lumbar - Right Rotation  25% limited   pain in R low back   Lumbar - Left Rotation  25% limited      Strength   Strength Assessment Site  Hip;Knee    Right/Left Hip  Right;Left    Right Hip Flexion  4/5    Right Hip Extension  4-/5    Right Hip External Rotation   4-/5    Right Hip Internal Rotation  4-/5    Right Hip ABduction  4/5    Right Hip ADduction  4-/5    Left Hip Flexion  4/5    Left Hip Extension  4-/5    Left Hip External Rotation  4-/5    Left Hip Internal Rotation  4-/5    Left Hip ABduction  4/5    Left Hip ADduction  4-/5    Right/Left Knee  Right;Left    Right Knee Flexion  4/5    Right Knee Extension  4+/5    Left Knee Flexion  4/5    Left Knee Extension  4+/5      Flexibility   Soft Tissue Assessment /Muscle Length  yes    Hamstrings  WFL    Quadriceps  mild tight B    ITB  WFL    Piriformis  mild/mod tight B      Palpation   Palpation comment  increased muscle tension with ttp in B lumbar paraspinals, glutes & piriformis (L>R)                Objective measurements completed on examination: See above findings.      [facility] Adult PT Treatment/Exercise - 02/04/19 0847      Transfers   Transfers  Supine to Sit    Supine to Sit  5: Supervision    Supine to Sit Details (indicate cue type and reason)  cues for log rolling & sidelying to sit to minimize low back strain    Supine to Sit Details  Verbal cues for sequencing;Verbal  cues for technique      Exercises   Exercises  Lumbar      Lumbar Exercises: Stretches   Single Knee to Chest Stretch  Right;Left;30 seconds;1 rep    Lower Trunk Rotation  30 seconds;2 reps    Figure 4 Stretch  30 seconds;2 reps;Supine;With overpressure    Figure 4 Stretch Limitations  figure 4 with downward pressure on knee & single leg figure 4 to chest      Lumbar Exercises: Supine   Pelvic Tilt  5 reps;5 seconds             PT Education - 02/04/19 0940     Education Details  PT eval findings, anticipated POC & initial HEP including proper body mechanics for transtions in/out of bed    Person(s) Educated  Patient    Methods  Explanation;Demonstration;Verbal cues;Tactile cues;Handout    Comprehension  Verbalized understanding;Returned demonstration;Verbal cues required;Tactile cues required;Need further instruction       PT Short Term Goals - 02/04/19 0941      PT SHORT TERM GOAL #1   Title  Patient will be independent with initial HEP    Status  New    Target Date  02/18/19      PT SHORT TERM GOAL #2   Title  Patient will verbalize/demonstrate understanding of neutral spine posture and proper body mechanics to reduce strain on lumbar spine    Status  New    Target Date  02/25/19        PT Long Term Goals - 02/04/19 0941      PT LONG TERM GOAL #1   Title  Patient will be independent with ongoing/advanced HEP    Status  New    Target Date  03/18/19      PT LONG TERM GOAL #2   Title  Patient will verbalize/demonstrate good awareness of neutral spine posture and proper body mechanics for daily tasks    Status  New    Target Date  03/18/19      PT LONG TERM GOAL #3   Title  Patient to improve lumbar AROM to WNL without pain provocation    Status  New    Target Date  03/18/19      PT LONG TERM GOAL #4   Title  Patient will demonstrate improved proximal strength to >/= 4/5 to 4+/5 for improved core stability    Status  New    Target Date  03/18/19      PT LONG TERM GOAL #5   Title  Patient to report ability to perform ADLs, household and work-related tasks without increased low back pain    Status  New    Target Date  03/18/19      PT LONG TERM GOAL #6   Title  Patient will be able to resume exercising/working out without limitation due to low back pain    Status  New    Target Date  03/18/19             Plan - 02/04/19 0941    Clinical Impression Statement  Mary [name] is a 54 y/o female who presents to OP PT for  acute on chronic low back pain associated with lumbosacral sprain. She reports chronic "off and on" low back pain x ~3 years worsening over the past month with no specific MOI reported. She did suffer a fall at work last year in August resulting in a L foot and ankle sprain and L wrist sprain  for which she received PT through workers comp in Sept-Oct 2020. Lumbar x-rays from ~1 yr ago revealed mild degenerative disc disease at L1-2 and facet joint hypertrophy at L5-S1. Lumbar ROM mildly limited in extension & rotation (R LBP with R rotation) with pain also noted in L low back with flexion and B lateral flexion although ROM WFL. Mild to moderate restriction noted in proximal LE/lumbopelvic flexibility with reduced B proximal LE and core strength. Pain limits her with bed mobility, sit <> stand transitions and bending over. Quadira will benefit from skilled PT services to address above impairments and allow for performance normal daily activities with decreased pain interference and allow return to working out.    Personal Factors and Comorbidities  Profession;Comorbidity 3+    Comorbidities  chronic LBP; HTN; recent WC PT for L foot & ankle sprain, L wrist sprain  resulting from a fall at work    Examination-Activity Limitations  Bend;Lift;Carry;Sit;Sleep;Transfers    Examination-Participation Restrictions  Cleaning;Laundry    Stability/Clinical Decision Making  Stable/Uncomplicated    Clinical Decision Making  Low    Rehab Potential  Good    PT Frequency  2x / week    PT Duration  6 weeks    PT Treatment/Interventions  ADLs/Self Care Home Management;Cryotherapy;Electrical Stimulation;Iontophoresis 4mg /ml Dexamethasone;Moist Heat;Traction;Ultrasound;Functional mobility training;Therapeutic activities;Therapeutic exercise;Neuromuscular re-education;Patient/family education;Manual techniques;Passive range of motion;Dry needling;Taping;Spinal Manipulations;Joint Manipulations    PT Next Visit Plan  Review  initial HEP; posture and body mechanics education; lumbopelvic flexibility and strengthening; manual therapy and modalities PRN    Consulted and Agree with Plan of Care  Patient       Patient will benefit from skilled therapeutic intervention in order to improve the following deficits and impairments:  Decreased activity tolerance, Decreased knowledge of precautions, Decreased mobility, Decreased range of motion, Decreased strength, Increased fascial restricitons, Increased muscle spasms, Impaired perceived functional ability, Impaired flexibility, Impaired sensation, Improper body mechanics, Postural dysfunction, Pain  Visit Diagnosis: Chronic bilateral low back pain without sciatica  Abnormal posture  Muscle weakness (generalized)  Other symptoms and signs involving the musculoskeletal system     Problem List Patient Active Problem List   Diagnosis Date Noted  . Hypertension 07/30/2018  . Trigger finger, left middle finger 07/12/2018  . Musculoskeletal back pain 12/01/2017  . Common migraine 06/07/2017  . Chronic lower back pain 12/21/2015  . Postmenopausal 05/15/2015  . Hyperlipidemia 11/17/2014  . 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 Obesity 11/17/2014  . Knee pain, right 04/09/2013  . Constipation 07/08/2009    Percival Spanish, PT, MPT 02/04/2019, 10:21 AM  [facility] [address]  [place], [place], [address] Phone: [phone]   Fax:  [phone]  Name: Mary [name] MRN: [id number] Date of Birth: 1965/11/29

## 2019-02-04 NOTE — Patient Instructions (Signed)
    Home exercise program created by Malley Hauter, PT.  For questions, please contact Dalal Livengood via phone at [phone] or email at [email or web address]  [name] [place] [address]  [address] [place], [place], [address] Phone: [phone]   Fax:  [phone]    

## 2019-02-07 ENCOUNTER — Ambulatory Visit: Payer: 59 | Admitting: Physical Therapy

## 2019-02-07 ENCOUNTER — Other Ambulatory Visit: Payer: Self-pay

## 2019-02-07 ENCOUNTER — Encounter: Payer: Self-pay | Admitting: Physical Therapy

## 2019-02-07 DIAGNOSIS — M6281 Muscle weakness (generalized): Secondary | ICD-10-CM | POA: Diagnosis not present

## 2019-02-07 DIAGNOSIS — M545 Low back pain, unspecified: Secondary | ICD-10-CM

## 2019-02-07 DIAGNOSIS — R29898 Other symptoms and signs involving the musculoskeletal system: Secondary | ICD-10-CM

## 2019-02-07 DIAGNOSIS — R293 Abnormal posture: Secondary | ICD-10-CM

## 2019-02-07 DIAGNOSIS — G8929 Other chronic pain: Secondary | ICD-10-CM

## 2019-02-07 NOTE — Patient Instructions (Addendum)

## 2019-02-07 NOTE — Therapy (Signed)
[facility] [address]  [place], [place], [address] Phone: [phone]   Fax:  [phone]  Physical Therapy Treatment  Patient Details  Name: Mary [name] MRN: [id number] Date of Birth: 1965/06/16 Referring Provider (PT): Gregor Hams, MD   Encounter Date: 02/07/2019  PT End of Session - 02/07/19 0933    Visit Number  2    Number of Visits  12    Date for PT Re-Evaluation  03/18/19    Authorization Type  [facility] - VL: [place]    PT Start Time  0933    PT Stop Time  1014    PT Time Calculation (min)  41 min    Activity Tolerance  Patient tolerated treatment well    Behavior During Therapy  [facility] for tasks assessed/performed       Past Medical History:  Diagnosis Date  . Allergy   . Arthritis   . Headache(784.0)   . Hyperlipidemia     Past Surgical History:  Procedure Laterality Date  . CESAREAN SECTION      There were no vitals filed for this visit.  Subjective Assessment - 02/07/19 0935    Subjective  Pt requesting review of initial HEP.    Diagnostic tests  Lumbar x-ray 03/07/18: Mild degenerative disc disease at L1-2 and facet joint hypertrophy at L5-S1. No compression fracture or spondylolisthesis.    Patient Stated Goals  "I want to be able to do exercise or work w/o pain all the time."    Currently in Pain?  Yes    Pain Score  1     Pain Location  Back    Pain Orientation  Lower;Right;Left    Pain Descriptors / Indicators  --   "stiff"   Pain Type  Acute pain    Pain Frequency  Intermittent                       [facility] Adult PT Treatment/Exercise - 02/07/19 0933      Self-Care   Self-Care  Posture    Posture  Provided education in proper posture and body mechanics for typical daily household and work tasks.      Exercises   Exercises  Lumbar      Lumbar Exercises: Stretches   Single Knee to Chest Stretch  Right;Left;30 seconds;2 reps    Single Knee to Chest Stretch  Limitations  opp LE straight, cues to hold behind knee rather than on top    Lower Trunk Rotation  30 seconds;3 reps    Piriformis Stretch  Right;Left;30 seconds;1 rep    Piriformis Stretch Limitations  KTOS with opp LE straight    Figure 4 Stretch  30 seconds;2 reps;Supine;With overpressure    Figure 4 Stretch Limitations  figure 4 with downward pressure on knee & single leg figure 4 to chest (latter modified to KTOS)      Lumbar Exercises: Aerobic   Nustep  L4 x 6 min      Lumbar Exercises: Supine   Pelvic Tilt  10 reps;5 seconds             PT Education - 02/07/19 1014    Education Details  HEP modification - KTOS piriformis stretch substituted for single leg figure 4 to chest; Posture & body mechanics education    Person(s) Educated  Patient    Methods  Explanation;Demonstration;Handout    Comprehension  Verbalized understanding;Returned demonstration;Need further instruction  PT Short Term Goals - 02/07/19 0937      PT SHORT TERM GOAL #1   Title  Patient will be independent with initial HEP    Status  On-going    Target Date  02/18/19      PT SHORT TERM GOAL #2   Title  Patient will verbalize/demonstrate understanding of neutral spine posture and proper body mechanics to reduce strain on lumbar spine    Status  On-going    Target Date  02/25/19        PT Long Term Goals - 02/07/19 0937      PT LONG TERM GOAL #1   Title  Patient will be independent with ongoing/advanced HEP    Status  On-going    Target Date  03/18/19      PT LONG TERM GOAL #2   Title  Patient will verbalize/demonstrate good awareness of neutral spine posture and proper body mechanics for daily tasks    Status  On-going    Target Date  03/18/19      PT LONG TERM GOAL #3   Title  Patient to improve lumbar AROM to WNL without pain provocation    Status  On-going    Target Date  03/18/19      PT LONG TERM GOAL #4   Title  Patient will demonstrate improved proximal strength to >/=  4/5 to 4+/5 for improved core stability    Status  On-going    Target Date  03/18/19      PT LONG TERM GOAL #5   Title  Patient to report ability to perform ADLs, household and work-related tasks without increased low back pain    Status  On-going    Target Date  03/18/19      PT LONG TERM GOAL #6   Title  Patient will be able to resume exercising/working out without limitation due to low back pain    Status  On-going    Target Date  03/18/19            Plan - 02/07/19 [id number]    Clinical Impression Statement  Elder Negus requesting review of initial HEP as she is unsure if she is performing exercises properly - review completed with cueing provided for clarification of hand placement, angle and intensity of pull during [facility] & piriformis stretches with single leg figure 4 to chest modified to single leg KTOS. Also reviewed and clarified proper bed mobility technique to minimize low back strain as well as provided education for proper posture and body mechanics with typical daily tasks and mobility with patient acknowledging areas for improvement in daily home and work activities.    Personal Factors and Comorbidities  Profession;Comorbidity 3+    Comorbidities  chronic LBP; HTN; recent WC PT for L foot & ankle sprain, L wrist sprain  resulting from a fall at work    Examination-Activity Limitations  Bend;Lift;Carry;Sit;Sleep;Transfers    Examination-Participation Restrictions  Cleaning;Laundry    Stability/Clinical Decision Making  Stable/Uncomplicated    Rehab Potential  Good    PT Frequency  2x / week    PT Duration  6 weeks    PT Treatment/Interventions  ADLs/Self Care Home Management;Cryotherapy;Electrical Stimulation;Iontophoresis 4mg /ml Dexamethasone;Moist Heat;Traction;Ultrasound;Functional mobility training;Therapeutic activities;Therapeutic exercise;Neuromuscular re-education;Patient/family education;Manual techniques;Passive range of motion;Dry needling;Taping;Spinal  Manipulations;Joint Manipulations    PT Next Visit Plan  lumbopelvic flexibility and strengthening; manual therapy and modalities PRN    PT Home Exercise Plan  02/04/19 - SKTC, piriformis stretches, LTR, pelvic tilts, bed  mobility with neutral spine    Consulted and Agree with Plan of Care  Patient       Patient will benefit from skilled therapeutic intervention in order to improve the following deficits and impairments:  Decreased activity tolerance, Decreased knowledge of precautions, Decreased mobility, Decreased range of motion, Decreased strength, Increased fascial restricitons, Increased muscle spasms, Impaired perceived functional ability, Impaired flexibility, Impaired sensation, Improper body mechanics, Postural dysfunction, Pain  Visit Diagnosis: Chronic bilateral low back pain without sciatica  Abnormal posture  Muscle weakness (generalized)  Other symptoms and signs involving the musculoskeletal system     Problem List Patient Active Problem List   Diagnosis Date Noted  . Hypertension 07/30/2018  . Trigger finger, left middle finger 07/12/2018  . Musculoskeletal back pain 12/01/2017  . Common migraine 06/07/2017  . Chronic lower back pain 12/21/2015  . Postmenopausal 05/15/2015  . Hyperlipidemia 11/17/2014  . 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 Obesity 11/17/2014  . Knee pain, right 04/09/2013  . Constipation 07/08/2009    Percival Spanish, PT, MPT 02/07/2019, 12:04 PM  [facility] [address]  [place], [place], [address] Phone: [phone]   Fax:  [phone]  Name: KEIONNA BASCIANO MRN: [id number] Date of Birth: March 05, 1965

## 2019-02-11 ENCOUNTER — Ambulatory Visit: Payer: 59 | Admitting: Physical Therapy

## 2019-02-11 ENCOUNTER — Encounter: Payer: Self-pay | Admitting: Physical Therapy

## 2019-02-11 ENCOUNTER — Other Ambulatory Visit: Payer: Self-pay

## 2019-02-11 DIAGNOSIS — R293 Abnormal posture: Secondary | ICD-10-CM

## 2019-02-11 DIAGNOSIS — G8929 Other chronic pain: Secondary | ICD-10-CM

## 2019-02-11 DIAGNOSIS — M545 Low back pain, unspecified: Secondary | ICD-10-CM

## 2019-02-11 DIAGNOSIS — R29898 Other symptoms and signs involving the musculoskeletal system: Secondary | ICD-10-CM | POA: Diagnosis not present

## 2019-02-11 DIAGNOSIS — M6281 Muscle weakness (generalized): Secondary | ICD-10-CM

## 2019-02-11 NOTE — Therapy (Signed)
[facility] [place] [address]  [place], [place], [address] Phone: [phone]   Fax:  [phone]  Physical Therapy Treatment  Patient Details  Name: Mary [name] MRN: [id number] Date of Birth: January 13, 1966 Referring Provider (PT): Gregor Hams, MD   Encounter Date: 02/11/2019  PT End of Session - 02/11/19 0843    Visit Number  3    Number of Visits  12    Date for PT Re-Evaluation  03/18/19    Authorization Type  [facility] - VL: [place]    PT Start Time  0843    PT Stop Time  0945    PT Time Calculation (min)  62 min    Activity Tolerance  Patient tolerated treatment well    Behavior During Therapy  [facility] for tasks assessed/performed       Past Medical History:  Diagnosis Date  . Allergy   . Arthritis   . Headache(784.0)   . Hyperlipidemia     Past Surgical History:  Procedure Laterality Date  . CESAREAN SECTION      There were no vitals filed for this visit.  Subjective Assessment - 02/11/19 0846    Subjective  Pt reporting pain more loacalized to L side today and only with movement.    Diagnostic tests  Lumbar x-ray 03/07/18: Mild degenerative disc disease at L1-2 and facet joint hypertrophy at L5-S1. No compression fracture or spondylolisthesis.    Patient Stated Goals  "I want to be able to do exercise or work w/o pain all the time."    Currently in Pain?  Yes    Pain Score  0-No pain   3/10 with movement   Pain Location  Back    Pain Orientation  Left;Lower    Pain Descriptors / Indicators  Sharp    Pain Type  Acute pain    Pain Frequency  Intermittent                       [facility] Adult PT Treatment/Exercise - 02/11/19 0843      Lumbar Exercises: Stretches   Quadruped Mid Back Stretch  30 seconds;3 reps    Quadruped Mid Back Stretch Limitations  seated 3-way prayer stretch with green Pball      Lumbar Exercises: Aerobic   Nustep  L4 x 6 min (UE/LE)      Lumbar Exercises: Supine    Pelvic Tilt  10 reps;5 seconds    Clam  10 reps;3 seconds   2 sets   Clam Limitations  TrA + bent knee fall-out; 2nd set: alt hip ABD/ER with red TB    Bent Knee Raise  10 reps;3 seconds    Bent Knee Raise Limitations  brace marching    Dead Bug  10 reps;3 seconds    Dead Bug Limitations  LE only    Bridge  10 reps;5 seconds    Bridge Limitations  + red TB hip ABD isometric    Isometric Hip Flexion  10 reps;5 seconds    Isometric Hip Flexion Limitations  alt LE      Lumbar Exercises: Sidelying   Clam  Right;Left;10 reps;3 seconds    Clam Limitations  red TB      Modalities   Modalities  Electrical Stimulation;Moist Heat      Moist Heat Therapy   Number Minutes Moist Heat  15 Minutes    Moist Heat Location  Lumbar Spine  Acupuncturist Location  B lumbar paraspinals    Electrical Stimulation Action  IFC    Electrical Stimulation Parameters  90-150 Hz, intensity to pt tolerance x 15'    Electrical Stimulation Goals  Pain;Tone             PT Education - 02/11/19 0930    Education Details  HEP update - lumbopelvic strengthening - brace marching, hip flexion isometric, red TB hooklying clam, bridge + red TB hip ABD isometric    Person(s) Educated  Patient    Methods  Explanation;Demonstration;Handout;Verbal cues    Comprehension  Verbalized understanding;Returned demonstration;Verbal cues required;Need further instruction       PT Short Term Goals - 02/11/19 0848      PT SHORT TERM GOAL #1   Title  Patient will be independent with initial HEP    Status  Achieved      PT SHORT TERM GOAL #2   Title  Patient will verbalize/demonstrate understanding of neutral spine posture and proper body mechanics to reduce strain on lumbar spine    Status  Achieved        PT Long Term Goals - 02/07/19 0937      PT LONG TERM GOAL #1   Title  Patient will be independent with ongoing/advanced HEP    Status  On-going    Target Date  03/18/19       PT LONG TERM GOAL #2   Title  Patient will verbalize/demonstrate good awareness of neutral spine posture and proper body mechanics for daily tasks    Status  On-going    Target Date  03/18/19      PT LONG TERM GOAL #3   Title  Patient to improve lumbar AROM to WNL without pain provocation    Status  On-going    Target Date  03/18/19      PT LONG TERM GOAL #4   Title  Patient will demonstrate improved proximal strength to >/= 4/5 to 4+/5 for improved core stability    Status  On-going    Target Date  03/18/19      PT LONG TERM GOAL #5   Title  Patient to report ability to perform ADLs, household and work-related tasks without increased low back pain    Status  On-going    Target Date  03/18/19      PT LONG TERM GOAL #6   Title  Patient will be able to resume exercising/working out without limitation due to low back pain    Status  On-going    Target Date  03/18/19            Plan - 02/11/19 0849    Clinical Impression Statement  Mary [name] reporting no further questions with HEP and improving awareness of posture and body mechanics following instruction last visit - STGs met. Treatment session today focusing on core stabilization with introduction of lumbopelvic strengthening - intermittent cues necessary to maintain neutral spine and slight muscle soreness noted with side lying clams, but otherwise well tolerated. Treatment concluded with trial of estim as patient reporting MD had recommended she purchase a TENS unit - will assess response next visit and provide info on home unit options if benefit noted.    Personal Factors and Comorbidities  Profession;Comorbidity 3+    Comorbidities  chronic LBP; HTN; recent WC PT for L foot & ankle sprain, L wrist sprain  resulting from a fall at work    Examination-Activity Limitations  Bend;Lift;Carry;Sit;Sleep;Transfers    Examination-Participation Restrictions  Cleaning;Laundry    Stability/Clinical Decision Making   Stable/Uncomplicated    Rehab Potential  Good    PT Frequency  2x / week    PT Duration  6 weeks    PT Treatment/Interventions  ADLs/Self Care Home Management;Cryotherapy;Electrical Stimulation;Iontophoresis 73m/ml Dexamethasone;Moist Heat;Traction;Ultrasound;Functional mobility training;Therapeutic activities;Therapeutic exercise;Neuromuscular re-education;Patient/family education;Manual techniques;Passive range of motion;Dry needling;Taping;Spinal Manipulations;Joint Manipulations    PT Next Visit Plan  lumbopelvic flexibility and strengthening; manual therapy and modalities PRN - assess response to Estim and provide info on home TENS unit options as benefit noted    PT Home Exercise Plan  02/04/19 - SKTC, piriformis stretches, LTR, pelvic tilts, bed mobility with neutral spine; 02/11/19 - brace marching, hip flexion isometric, red TB hooklying clam, bridge + red TB hip ABD isometric    Consulted and Agree with Plan of Care  Patient       Patient will benefit from skilled therapeutic intervention in order to improve the following deficits and impairments:  Decreased activity tolerance, Decreased knowledge of precautions, Decreased mobility, Decreased range of motion, Decreased strength, Increased fascial restricitons, Increased muscle spasms, Impaired perceived functional ability, Impaired flexibility, Impaired sensation, Improper body mechanics, Postural dysfunction, Pain  Visit Diagnosis: Chronic bilateral low back pain without sciatica  Abnormal posture  Muscle weakness (generalized)  Other symptoms and signs involving the musculoskeletal system     Problem List Patient Active Problem List   Diagnosis Date Noted  . Hypertension 07/30/2018  . Trigger finger, left middle finger 07/12/2018  . Musculoskeletal back pain 12/01/2017  . Common migraine 06/07/2017  . Chronic lower back pain 12/21/2015  . Postmenopausal 05/15/2015  . Hyperlipidemia 11/17/2014  . 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  Obesity 11/17/2014  . Knee pain, right 04/09/2013  . Constipation 07/08/2009    JPercival Spanish PT, MPT 02/11/2019, 12:14 PM  [facility] [place] [address]Phone: [phone]  Fax:  [phone] Name: Mary CEDOTALMRN: [id number] of Birth: 626-Jul-1967

## 2019-02-11 NOTE — Patient Instructions (Signed)
    Home exercise program created by Shawnika Pepin, PT.  For questions, please contact Arlone Lenhardt via phone at [phone] or email at [email or web address]  [facility] [place] [address]  [address] [place], [place], [address] Phone: [phone]   Fax:  [phone]    

## 2019-02-14 ENCOUNTER — Ambulatory Visit: Payer: 59

## 2019-02-14 ENCOUNTER — Other Ambulatory Visit: Payer: Self-pay

## 2019-02-14 DIAGNOSIS — R29898 Other symptoms and signs involving the musculoskeletal system: Secondary | ICD-10-CM | POA: Diagnosis not present

## 2019-02-14 DIAGNOSIS — G8929 Other chronic pain: Secondary | ICD-10-CM | POA: Diagnosis not present

## 2019-02-14 DIAGNOSIS — M545 Low back pain, unspecified: Secondary | ICD-10-CM

## 2019-02-14 DIAGNOSIS — R293 Abnormal posture: Secondary | ICD-10-CM

## 2019-02-14 DIAGNOSIS — M6281 Muscle weakness (generalized): Secondary | ICD-10-CM

## 2019-02-14 NOTE — Patient Instructions (Signed)
TENS UNIT ? ?This is helpful for muscle pain and spasm.  ? ?Search and Purchase a TENS 7000 2nd edition at www.tenspros.com or www.amazon.com  (It should be less than $30)  ? ? ? ?TENS unit instructions:  ?Do not shower or bathe with the unit on ?Turn the unit off before removing electrodes or batteries ?If the electrodes lose stickiness add a drop of water to the electrodes after they are disconnected from the unit and place on plastic sheet. If you continued to have difficulty, call the TENS unit company to purchase more electrodes. ?Do not apply lotion on the skin area prior to use. Make sure the skin is clean and dry as this will help prolong the life of the electrodes. ?After use, always check skin for unusual red areas, rash or other skin difficulties. If there are any skin problems, does not apply electrodes to the same area. ?Never remove the electrodes from the unit by pulling the wires. ?Do not use the TENS unit or electrodes other than as directed. ?Do not change electrode placement without consulting your therapist or physician. ?Keep 2 fingers with between each electrode.  ? ?TENS stands for Transcutaneous Electrical Nerve Stimulation. In other words, electrical impulses are allowed to pass through the skin in order to excite a nerve.  ? ?Purpose and Use of TENS:  ?TENS is a method used to manage acute and chronic pain without the use of drugs. It has been effective in managing pain associated with surgery, sprains, strains, trauma, rheumatoid arthritis, and neuralgias. It is a non-addictive, low risk, and non-invasive technique used to control pain. It is not, by any means, a curative form of treatment.  ? ?How TENS Works:  ?Most TENS units are a small pocket-sized unit powered by one 9 volt battery. Attached to the outside of the unit are two lead wires where two pins and/or snaps connect on each wire. All units come with a set of four reusable pads or electrodes. These are placed on the skin  surrounding the area involved. By inserting the leads into  the pads, the electricity can pass from the unit making the circuit complete.  ?As the intensity is turned up slowly, the electrical current enters the body from the electrodes through the skin to the surrounding nerve fibers. This triggers the release of hormones from within the body. These hormones contain pain relievers. By increasing the circulation of these hormones, the person?s pain may be lessened. It is also believed that the electrical stimulation itself helps to block the pain messages being sent to the brain, thus also decreasing the body?s perception of pain.  ? ?Hazards:  ?TENS units are NOT to be used by patients with PACEMAKERS, DEFIBRILLATORS, DIABETIC PUMPS, PREGNANT WOMEN, and patients with SEIZURE DISORDERS.  ?TENS units are NOT to be used over the heart, throat, brain, or spinal cord.  ?One of the major side effects from the TENS unit may be skin irritation. Some people may develop a rash if they are sensitive to the materials used in the electrodes or the connecting wires.  ? ?Wear the unit for 15 min.  ? ?Avoid overuse due the body getting used to the stem making it not as effective over time.  ?  ?

## 2019-02-14 NOTE — Therapy (Signed)
[facility] [place] [address]  [place], [place], [address] Phone: [phone]   Fax:  [phone]  Physical Therapy Treatment  Patient Details  Name: Mary [name] MRN: [id number] Date of Birth: Dec 12, 1965 Referring Provider (PT): Gregor Hams, MD   Encounter Date: 02/14/2019  PT End of Session - 02/14/19 0937    Visit Number  4    Number of Visits  12    Date for PT Re-Evaluation  03/18/19    Authorization Type  [facility] - VL: [place]    PT Start Time  0930    PT Stop Time  1024    PT Time Calculation (min)  54 min    Activity Tolerance  Patient tolerated treatment well    Behavior During Therapy  [facility] for tasks assessed/performed       Past Medical History:  Diagnosis Date  . Allergy   . Arthritis   . Headache(784.0)   . Hyperlipidemia     Past Surgical History:  Procedure Laterality Date  . CESAREAN SECTION      There were no vitals filed for this visit.  Subjective Assessment - 02/14/19 0935    Subjective  Pt. reporting she is performing HEP 2x/week between therapy visits.    Diagnostic tests  Lumbar x-ray 03/07/18: Mild degenerative disc disease at L1-2 and facet joint hypertrophy at L5-S1. No compression fracture or spondylolisthesis.    Patient Stated Goals  "I want to be able to do exercise or work w/o pain all the time."    Currently in Pain?  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 noting benefit from E-stim. utilized last session;  Reviewed self-tennis ball  massage on wall to B glutes as pt. noting occasional tenderness in buttocks musculature during acute LBP flare-ups      Lumbar Exercises: Stretches   Lower Trunk Rotation Limitations  5" x 10 reps       Lumbar Exercises: Aerobic   Nustep  L4 x 6 min (UE/LE)      Lumbar Exercises: Standing   Row  Both;10 reps;Strengthening;Theraband    Theraband Level (Row)  Level 2 (Red)    Row Limitations  tactile cues for scapular retraction     Shoulder Extension  Both;10 reps;Strengthening;Theraband    Theraband Level (Shoulder Extension)  Level 2 (Red)    Shoulder Extension Limitations  Tactile cueing required for scap. depression/retraction     Other Standing Lumbar Exercises  B pallof press with red TB x 10 rpes each way       Lumbar Exercises: Supine   Clam  10 reps;3 seconds    Clam Limitations  Alt hip ABD/ER with red TB    Bent Knee Raise  10 reps;3 seconds   cues required for slow performance    Bent Knee Raise Limitations  brace marching  Bridge  10 reps;5 seconds   half ROM    Bridge Limitations  + red TB hip ABD isometric   cues required for increased height of hip extension motion   Isometric Hip Flexion  10 reps;5 seconds    Isometric Hip Flexion Limitations  alt LE             PT Education - 02/14/19 1221    Education Details  General TENS unit educational handout issued to pt. along with electrode placement diagram and TENS unit rationale, purpose    Person(s) Educated  Patient    Methods  Explanation    Comprehension  Verbalized understanding;Returned demonstration;Verbal cues required       PT Short Term Goals - 02/11/19 0848      PT SHORT TERM GOAL #1   Title  Patient will be independent with initial HEP    Status  Achieved      PT SHORT TERM GOAL #2   Title  Patient will verbalize/demonstrate understanding of neutral spine posture and proper body mechanics to reduce strain on lumbar spine    Status  Achieved        PT Long Term Goals - 02/07/19 0937       PT LONG TERM GOAL #1   Title  Patient will be independent with ongoing/advanced HEP    Status  On-going    Target Date  03/18/19      PT LONG TERM GOAL #2   Title  Patient will verbalize/demonstrate good awareness of neutral spine posture and proper body mechanics for daily tasks    Status  On-going    Target Date  03/18/19      PT LONG TERM GOAL #3   Title  Patient to improve lumbar AROM to WNL without pain provocation    Status  On-going    Target Date  03/18/19      PT LONG TERM GOAL #4   Title  Patient will demonstrate improved proximal strength to >/= 4/5 to 4+/5 for improved core stability    Status  On-going    Target Date  03/18/19      PT LONG TERM GOAL #5   Title  Patient to report ability to perform ADLs, household and work-related tasks without increased low back pain    Status  On-going    Target Date  03/18/19      PT LONG TERM GOAL #6   Title  Patient will be able to resume exercising/working out without limitation due to low back pain    Status  On-going    Target Date  03/18/19            Plan - 02/14/19 0944    Clinical Impression Statement  Pt. reporting that she is performing HEP 2x/week on days between therapy visits.  Pt. encouraged to perform full HEP everyday she is not at PT sessions and pt. verbalizing understanding.  Pt. did note benefit from E-stim utilized last session thus provided her educational handout for possible purchase of home TENS unit and briefly covered TENS 7000 2nd edition home TENS unit setup to answer a few of patient's initial questions regarding unit.  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  Pt. progressing toward LTG #2.  Reviewed use of tennis ball self-massage on wall for home use to B glutes as pt. noting occasional tenderness in this area.  Able to  initiate scapular strengthening today with min cues required for scapular  mechanics required.  Ended visit pain free thus modalities deferred.    Comorbidities  chronic LBP; HTN; recent WC PT for L foot & ankle sprain, L wrist sprain  resulting from a fall at work    Rehab Potential  Good    PT Treatment/Interventions  ADLs/Self Care Home Management;Cryotherapy;Electrical Stimulation;Iontophoresis 4mg /ml Dexamethasone;Moist Heat;Traction;Ultrasound;Functional mobility training;Therapeutic activities;Therapeutic exercise;Neuromuscular re-education;Patient/family education;Manual techniques;Passive range of motion;Dry needling;Taping;Spinal Manipulations;Joint Manipulations    PT Next Visit Plan  lumbopelvic flexibility and strengthening; manual therapy and modalities PRN    PT Home Exercise Plan  02/04/19 - SKTC, piriformis stretches, LTR, pelvic tilts, bed mobility with neutral spine; 02/11/19 - brace marching, hip flexion isometric, red TB hooklying clam, bridge + red TB hip ABD isometric    Consulted and Agree with Plan of Care  Patient       Patient will benefit from skilled therapeutic intervention in order to improve the following deficits and impairments:  Decreased activity tolerance, Decreased knowledge of precautions, Decreased mobility, Decreased range of motion, Decreased strength, Increased fascial restricitons, Increased muscle spasms, Impaired perceived functional ability, Impaired flexibility, Impaired sensation, Improper body mechanics, Postural dysfunction, Pain  Visit Diagnosis: Chronic bilateral low back pain without sciatica  Abnormal posture  Muscle weakness (generalized)  Other symptoms and signs involving the musculoskeletal system     Problem List Patient Active Problem List   Diagnosis Date Noted  . Hypertension 07/30/2018  . Trigger finger, left middle finger 07/12/2018  . Musculoskeletal back pain 12/01/2017  . Common migraine 06/07/2017  . Chronic lower back pain 12/21/2015  . Postmenopausal 05/15/2015  . Hyperlipidemia 11/17/2014   . 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 Obesity 11/17/2014  . Knee pain, right 04/09/2013  . Constipation 07/08/2009    Bess Harvest, PTA 02/14/19 12:22 PM    [facility] [place] [address]  [place], [place], [address] Phone: [phone]   Fax:  [phone]  Name: Mary [name] MRN: [id number] Date of Birth: 22-Nov-1965

## 2019-02-18 ENCOUNTER — Ambulatory Visit: Payer: 59 | Attending: Family Medicine | Admitting: Physical Therapy

## 2019-02-18 ENCOUNTER — Other Ambulatory Visit: Payer: Self-pay

## 2019-02-18 ENCOUNTER — Encounter: Payer: Self-pay | Admitting: Physical Therapy

## 2019-02-18 DIAGNOSIS — M545 Low back pain, unspecified: Secondary | ICD-10-CM

## 2019-02-18 DIAGNOSIS — M6281 Muscle weakness (generalized): Secondary | ICD-10-CM | POA: Diagnosis not present

## 2019-02-18 DIAGNOSIS — R29898 Other symptoms and signs involving the musculoskeletal system: Secondary | ICD-10-CM | POA: Insufficient documentation

## 2019-02-18 DIAGNOSIS — R293 Abnormal posture: Secondary | ICD-10-CM | POA: Diagnosis not present

## 2019-02-18 DIAGNOSIS — G8929 Other chronic pain: Secondary | ICD-10-CM | POA: Diagnosis not present

## 2019-02-18 NOTE — Therapy (Signed)
[facility] [address]  [place], [place], [address] Phone: [phone]   Fax:  [phone]  Physical Therapy Treatment  Patient Details  Name: Mary [name] MRN: [id number] Date of Birth: November 20, 1965 Referring Provider (PT): Gregor Hams, MD   Encounter Date: 02/18/2019  PT End of Session - 02/18/19 0930    Visit Number  5    Number of Visits  12    Date for PT Re-Evaluation  03/18/19    Authorization Type  [facility] - VL: [place]    PT Start Time  0930    PT Stop Time  1028    PT Time Calculation (min)  58 min    Activity Tolerance  Patient tolerated treatment well    Behavior During Therapy  [facility] for tasks assessed/performed       Past Medical History:  Diagnosis Date  . Allergy   . Arthritis   . Headache(784.0)   . Hyperlipidemia     Past Surgical History:  Procedure Laterality Date  . CESAREAN SECTION      There were no vitals filed for this visit.  Subjective Assessment - 02/18/19 0933    Subjective  Pt reporting pain more right sided today, mostly with movement such as sit <> stand transitions.    Diagnostic tests  Lumbar x-ray 03/07/18: Mild degenerative disc disease at L1-2 and facet joint hypertrophy at L5-S1. No compression fracture or spondylolisthesis.    Patient Stated Goals  "I want to be able to do exercise or work w/o pain all the time."    Currently in Pain?  Yes    Pain Score  2    with movement   Pain Location  Back    Pain Orientation  Right    Pain Descriptors / Indicators  --   "stiff"   Pain Type  Acute pain    Pain Frequency  Intermittent                       [facility] Adult PT Treatment/Exercise - 02/18/19 0930      Lumbar Exercises: Stretches   Prone on Elbows Stretch  30 seconds;2 reps    Press Ups  5 reps;5 seconds      Lumbar Exercises: Aerobic   Nustep  L4 x 6 min (UE/LE)      Lumbar Exercises: Supine   Bridge  10 reps;5 seconds    Bridge Limitations   + red TB hip ABD isometric      Lumbar Exercises: Sidelying   Clam  Right;Left;10 reps;3 seconds    Clam Limitations  red TB      Lumbar Exercises: Prone   Straight Leg Raise  10 reps;3 seconds      Lumbar Exercises: Quadruped   Madcat/Old Horse  10 reps    Madcat/Old Horse Limitations  limited tolerance d/t knee discomfort      Modalities   Modalities  Electrical Stimulation;Moist Heat      Moist Heat Therapy   Number Minutes Moist Heat  15 Minutes    Moist Heat Location  Lumbar Spine      Electrical Stimulation   Electrical Stimulation Location  B lumbar paraspinals & upper glutes    Electrical Stimulation Action  TENS    Electrical Stimulation Parameters  intensity to pt tolerance x 15'    Electrical Stimulation Goals  Pain;Tone      Manual Therapy   Manual Therapy  Soft tissue mobilization;Myofascial release    Manual therapy comments  prone    Soft tissue mobilization  STM/DTM to R glutes/piriformis    Myofascial Release  manual TPR to R glute medius/minimus - good relief noted               PT Short Term Goals - 02/18/19 0936      PT SHORT TERM GOAL #1   Title  Patient will be independent with initial HEP    Status  Achieved   02/11/19     PT SHORT TERM GOAL #2   Title  Patient will verbalize/demonstrate understanding of neutral spine posture and proper body mechanics to reduce strain on lumbar spine    Status  Achieved   02/11/19       PT Long Term Goals - 02/07/19 0937      PT LONG TERM GOAL #1   Title  Patient will be independent with ongoing/advanced HEP    Status  On-going    Target Date  03/18/19      PT LONG TERM GOAL #2   Title  Patient will verbalize/demonstrate good awareness of neutral spine posture and proper body mechanics for daily tasks    Status  On-going    Target Date  03/18/19      PT LONG TERM GOAL #3   Title  Patient to improve lumbar AROM to WNL without pain provocation    Status  On-going    Target Date  03/18/19       PT LONG TERM GOAL #4   Title  Patient will demonstrate improved proximal strength to >/= 4/5 to 4+/5 for improved core stability    Status  On-going    Target Date  03/18/19      PT LONG TERM GOAL #5   Title  Patient to report ability to perform ADLs, household and work-related tasks without increased low back pain    Status  On-going    Target Date  03/18/19      PT LONG TERM GOAL #6   Title  Patient will be able to resume exercising/working out without limitation due to low back pain    Status  On-going    Target Date  03/18/19            Plan - 02/18/19 0934    Clinical Impression Statement  Mary [name] reporting some mild stiffness today primarily with transitional movements. More relief noted with extension movements, therefore introduced POE with stretching, mobility and strengthening exercises from prone and quadruped positions. Limited tolerance for quadruped due to knee discomfort but tolerated prone exercises well. Manual therapy to R upper lateral buttocks providing some relief of pain, with treatment concluded with estim and moist heat to promote further muscle relaxation and pain relief.    Personal Factors and Comorbidities  Profession;Comorbidity 3+    Comorbidities  chronic LBP; HTN; recent WC PT for L foot & ankle sprain, L wrist sprain  resulting from a fall at work    Examination-Activity Limitations  Bend;Lift;Carry;Sit;Sleep;Transfers    Examination-Participation Restrictions  Cleaning;Laundry    Rehab Potential  Good    PT Frequency  2x / week    PT Duration  6 weeks    PT Treatment/Interventions  ADLs/Self Care Home Management;Cryotherapy;Electrical Stimulation;Iontophoresis 4mg /ml Dexamethasone;Moist Heat;Traction;Ultrasound;Functional mobility training;Therapeutic activities;Therapeutic exercise;Neuromuscular re-education;Patient/family education;Manual techniques;Passive range of motion;Dry needling;Taping;Spinal Manipulations;Joint Manipulations    PT Next  Visit Plan  lumbopelvic flexibility and strengthening; manual therapy and modalities PRN  PT Home Exercise Plan  02/04/19 - SKTC, piriformis stretches, LTR, pelvic tilts, bed mobility with neutral spine; 02/11/19 - brace marching, hip flexion isometric, red TB hooklying clam, bridge + red TB hip ABD isometric    Consulted and Agree with Plan of Care  Patient       Patient will benefit from skilled therapeutic intervention in order to improve the following deficits and impairments:  Decreased activity tolerance, Decreased knowledge of precautions, Decreased mobility, Decreased range of motion, Decreased strength, Increased fascial restricitons, Increased muscle spasms, Impaired perceived functional ability, Impaired flexibility, Impaired sensation, Improper body mechanics, Postural dysfunction, Pain  Visit Diagnosis: Chronic bilateral low back pain without sciatica  Abnormal posture  Muscle weakness (generalized)  Other symptoms and signs involving the musculoskeletal system     Problem List Patient Active Problem List   Diagnosis Date Noted  . Hypertension 07/30/2018  . Trigger finger, left middle finger 07/12/2018  . Musculoskeletal back pain 12/01/2017  . Common migraine 06/07/2017  . Chronic lower back pain 12/21/2015  . Postmenopausal 05/15/2015  . Hyperlipidemia 11/17/2014  . 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 Obesity 11/17/2014  . Knee pain, right 04/09/2013  . Constipation 07/08/2009    Percival Spanish, PT, MPT 02/18/2019, 12:28 PM  [facility] [address]  [place], [place], [address] Phone: [phone]   Fax:  [phone]  Name: Mary [name] MRN: [id number] Date of Birth: 06/25/1965

## 2019-02-21 ENCOUNTER — Other Ambulatory Visit: Payer: Self-pay

## 2019-02-21 ENCOUNTER — Ambulatory Visit: Payer: 59

## 2019-02-21 DIAGNOSIS — R293 Abnormal posture: Secondary | ICD-10-CM

## 2019-02-21 DIAGNOSIS — R29898 Other symptoms and signs involving the musculoskeletal system: Secondary | ICD-10-CM

## 2019-02-21 DIAGNOSIS — G8929 Other chronic pain: Secondary | ICD-10-CM | POA: Diagnosis not present

## 2019-02-21 DIAGNOSIS — M6281 Muscle weakness (generalized): Secondary | ICD-10-CM

## 2019-02-21 DIAGNOSIS — M545 Low back pain, unspecified: Secondary | ICD-10-CM

## 2019-02-21 NOTE — Therapy (Signed)
[facility] [address]  [place], [place], [address] Phone: [phone]   Fax:  [phone]  Physical Therapy Treatment  Patient Details  Name: KELAIAH CACAL MRN: [id number] Date of Birth: [birth year] Referring Provider (PT): Gregor Hams, MD   Encounter Date: 02/21/2019  PT End of Session - 02/21/19 0937    Visit Number  6    Number of Visits  12    Date for PT Re-Evaluation  03/18/19    Authorization Type  [facility] - VL: [place]    PT Start Time  [id number]    PT Stop Time  1014    PT Time Calculation (min)  43 min    Activity Tolerance  Patient tolerated treatment well    Behavior During Therapy  [facility] for tasks assessed/performed       Past Medical History:  Diagnosis Date  . Allergy   . Arthritis   . Headache(784.0)   . Hyperlipidemia     Past Surgical History:  Procedure Laterality Date  . CESAREAN SECTION      There were no vitals filed for this visit.  Subjective Assessment - 02/21/19 0936    Subjective  Pt. reporting some pain yesterday when rising to stand up from the bed.    Diagnostic tests  Lumbar x-ray 03/07/18: Mild degenerative disc disease at L1-2 and facet joint hypertrophy at L5-S1. No compression fracture or spondylolisthesis.    Patient Stated Goals  "I want to be able to do exercise or work w/o pain all the time."    Currently in Pain?  Yes    Pain Score  1     Pain Location  Back    Pain Orientation  Right;Lower    Pain Descriptors / Indicators  Aching   " stiff "   Pain Type  Acute pain    Pain Onset  More than a month ago    Aggravating Factors   sit<>stand from bed    Pain Relieving Factors  heating pad    Multiple Pain Sites  No         [facility] PT Assessment - 02/21/19 0001      Assessment   Medical Diagnosis  Lumbosacral sprain    Referring Provider (PT)  Gregor Hams, MD    Hand Dominance  Right    Next MD Visit  03/06/19    Prior Therapy  PT 09/2018 -10/2018 for L foot &  ankle sprain, L wrist sprain                    [facility] Adult PT Treatment/Exercise - 02/21/19 0001      Lumbar Exercises: Stretches   Piriformis Stretch  Right;Left;30 seconds;1 rep    Piriformis Stretch Limitations  KTOS with opp LE straight      Lumbar Exercises: Aerobic   Nustep  L5 x 7 min (UE/LE)      Lumbar Exercises: Machines for Strengthening   Other Lumbar Machine Exercise  BATCA low row 10# x 10 rpes - low handles       Lumbar Exercises: Standing   Functional Squats  3 seconds   x 12 reps    Functional Squats Limitations  counter       Lumbar Exercises: Supine   Bridge  5 seconds   x 12 reps    Bridge Limitations  + red TB hip ABD isometric      Lumbar Exercises: Sidelying  Clam  Right;Left   x 12 reps    Clam Limitations  red TB      Knee/Hip Exercises: Standing   Hip Abduction  Right;Left;10 reps;Knee straight   cues required for abdominal bracing   Abduction Limitations  Alternating at counter     Hip Extension  Left;Right;10 reps;Knee straight;Stengthening   cues required for abdominal bracing   Extension Limitations  alternating at counter       Manual Therapy   Manual Therapy  Soft tissue mobilization;Myofascial release;Other (comment)    Manual therapy comments  L sidelying with R LE elevated on bolster     Soft tissue mobilization  STM/DTM to R glute medius, R glute max    Myofascial Release  manual TPR to R glute medius - pt. started out "very tender" and noted reduction in tenderness after ~ 5 min     Other Manual Therapy  Encouraged pt. to use tennis ball self-massage on wall for relief at home                PT Short Term Goals - 02/18/19 0936      PT SHORT TERM GOAL #1   Title  Patient will be independent with initial HEP    Status  Achieved   02/11/19     PT SHORT TERM GOAL #2   Title  Patient will verbalize/demonstrate understanding of neutral spine posture and proper body mechanics to reduce strain on lumbar spine     Status  Achieved   02/11/19       PT Long Term Goals - 02/07/19 0937      PT LONG TERM GOAL #1   Title  Patient will be independent with ongoing/advanced HEP    Status  On-going    Target Date  03/18/19      PT LONG TERM GOAL #2   Title  Patient will verbalize/demonstrate good awareness of neutral spine posture and proper body mechanics for daily tasks    Status  On-going    Target Date  03/18/19      PT LONG TERM GOAL #3   Title  Patient to improve lumbar AROM to WNL without pain provocation    Status  On-going    Target Date  03/18/19      PT LONG TERM GOAL #4   Title  Patient will demonstrate improved proximal strength to >/= 4/5 to 4+/5 for improved core stability    Status  On-going    Target Date  03/18/19      PT LONG TERM GOAL #5   Title  Patient to report ability to perform ADLs, household and work-related tasks without increased low back pain    Status  On-going    Target Date  03/18/19      PT LONG TERM GOAL #6   Title  Patient will be able to resume exercising/working out without limitation due to low back pain    Status  On-going    Target Date  03/18/19            Plan - 02/21/19 0942    Clinical Impression Statement  Pt. noting her LBP has improved with work-related tasks.  Progressing toward LTG #5.  MT addressing palpable tightness and multiple tender areas in R glutes with reduction in tenderness noted afterward and pt. tolerated standing hip strengthening activities well as treatment progressed.  Pt. noting ~ 50% improvement in overall pain since starting physical therapy.  Ended visit pain free thus  modalities deferred.    Comorbidities  chronic LBP; HTN; recent WC PT for L foot & ankle sprain, L wrist sprain  resulting from a fall at work    Rehab Potential  Good    PT Treatment/Interventions  ADLs/Self Care Home Management;Cryotherapy;Electrical Stimulation;Iontophoresis 4mg /ml Dexamethasone;Moist Heat;Traction;Ultrasound;Functional mobility  training;Therapeutic activities;Therapeutic exercise;Neuromuscular re-education;Patient/family education;Manual techniques;Passive range of motion;Dry needling;Taping;Spinal Manipulations;Joint Manipulations    PT Next Visit Plan  lumbopelvic flexibility and strengthening; manual therapy and modalities PRN    PT Home Exercise Plan  02/04/19 - SKTC, piriformis stretches, LTR, pelvic tilts, bed mobility with neutral spine; 02/11/19 - brace marching, hip flexion isometric, red TB hooklying clam, bridge + red TB hip ABD isometric    Consulted and Agree with Plan of Care  Patient       Patient will benefit from skilled therapeutic intervention in order to improve the following deficits and impairments:  Decreased activity tolerance, Decreased knowledge of precautions, Decreased mobility, Decreased range of motion, Decreased strength, Increased fascial restricitons, Increased muscle spasms, Impaired perceived functional ability, Impaired flexibility, Impaired sensation, Improper body mechanics, Postural dysfunction, Pain  Visit Diagnosis: Chronic bilateral low back pain without sciatica  Abnormal posture  Muscle weakness (generalized)  Other symptoms and signs involving the musculoskeletal system     Problem List Patient Active Problem List   Diagnosis Date Noted  . Hypertension 07/30/2018  . Trigger finger, left middle finger 07/12/2018  . Musculoskeletal back pain 12/01/2017  . Common migraine 06/07/2017  . Chronic lower back pain 12/21/2015  . Postmenopausal 05/15/2015  . Hyperlipidemia 11/17/2014  . 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 Obesity 11/17/2014  . Knee pain, right 04/09/2013  . Constipation 07/08/2009    Bess Harvest, PTA 02/21/19 12:38 PM   [facility] [address]  [place], [place], [address] Phone: [phone]   Fax:  [phone]  Name: MIYU DEMARZO MRN: [id number] Date of Birth: October 13, 1965

## 2019-02-25 ENCOUNTER — Encounter: Payer: Self-pay | Admitting: Physical Therapy

## 2019-02-25 ENCOUNTER — Ambulatory Visit: Payer: 59 | Admitting: Physical Therapy

## 2019-02-25 ENCOUNTER — Other Ambulatory Visit: Payer: Self-pay

## 2019-02-25 DIAGNOSIS — R29898 Other symptoms and signs involving the musculoskeletal system: Secondary | ICD-10-CM

## 2019-02-25 DIAGNOSIS — G8929 Other chronic pain: Secondary | ICD-10-CM

## 2019-02-25 DIAGNOSIS — R293 Abnormal posture: Secondary | ICD-10-CM | POA: Diagnosis not present

## 2019-02-25 DIAGNOSIS — M545 Low back pain, unspecified: Secondary | ICD-10-CM

## 2019-02-25 DIAGNOSIS — M6281 Muscle weakness (generalized): Secondary | ICD-10-CM

## 2019-02-25 MED FILL — LOSARTAN POTASSIUM 25 MG TA: 25 | 90 days supply | Qty: 90 | Fill #1

## 2019-02-25 MED FILL — ATORVASTATIN 10 MG TABLET: 10 | 90 days supply | Qty: 90 | Fill #1

## 2019-02-25 NOTE — Therapy (Signed)
[facility] [address]  [place], [place], [address] Phone: [phone]   Fax:  [phone]  Physical Therapy Treatment  Patient Details  Name: Mary [name] MRN: [id number] Date of Birth: 28-Aug-1965 Referring Provider (PT): Gregor Hams, MD   Encounter Date: 02/25/2019  PT End of Session - 02/25/19 0940    Visit Number  7    Number of Visits  12    Date for PT Re-Evaluation  03/18/19    Authorization Type  [facility] - VL: [place]    PT Start Time  0940    PT Stop Time  1020    PT Time Calculation (min)  40 min    Activity Tolerance  Patient tolerated treatment well    Behavior During Therapy  [facility] for tasks assessed/performed       Past Medical History:  Diagnosis Date  . Allergy   . Arthritis   . Headache(784.0)   . Hyperlipidemia     Past Surgical History:  Procedure Laterality Date  . CESAREAN SECTION      There were no vitals filed for this visit.  Subjective Assessment - 02/25/19 0943    Subjective  Pt denies pain this morning, reporting onyl fatigue    Diagnostic tests  Lumbar x-ray 03/07/18: Mild degenerative disc disease at L1-2 and facet joint hypertrophy at L5-S1. No compression fracture or spondylolisthesis.    Patient Stated Goals  "I want to be able to do exercise or work w/o pain all the time."    Currently in Pain?  No/denies    Pain Onset  More than a month ago                       [facility] Adult PT Treatment/Exercise - 02/25/19 0940      Lumbar Exercises: Stretches   Prone on Elbows Stretch  30 seconds;2 reps    Press Ups  10 seconds;5 reps      Lumbar Exercises: Aerobic   Nustep  L5 x 6 min (UE/LE)   seat #8     Lumbar Exercises: Supine   Clam  10 reps;3 seconds    Clam Limitations  Abd bracing + alt hip ABD/ER with green TB    Bridge  10 reps;5 seconds    Bridge Limitations  + green TB hip ABD isometric      Lumbar Exercises: Sidelying   Clam  Right;Left;10  reps;3 seconds    Clam Limitations  green TB      Lumbar Exercises: Prone   Straight Leg Raise  10 reps;3 seconds    Opposite Arm/Leg Raise  10 reps;2 seconds;Right arm/Left leg;Left arm/Right leg      Knee/Hip Exercises: Standing   Hip Flexion  Right;Left;10 reps;Knee straight;Stengthening    Hip Flexion Limitations  yellow TB at ankle, cues for abd bracing, 1 pole A     Hip ADduction  Right;Left;10 reps;Strengthening    Hip ADduction Limitations  yellow TB at ankle, cues for abd bracing, 1 pole A     Hip Abduction  Right;Left;10 reps;Knee straight;Stengthening    Abduction Limitations  yellow TB at ankle, cues for abd bracing, 1 pole A     Hip Extension  Right;Left;10 reps;Knee straight;Stengthening    Extension Limitations  yellow TB at ankle, cues for abd bracing, 1 pole A                PT Short Term Goals -  02/18/19 0936      PT SHORT TERM GOAL #1   Title  Patient will be independent with initial HEP    Status  Achieved   02/11/19     PT SHORT TERM GOAL #2   Title  Patient will verbalize/demonstrate understanding of neutral spine posture and proper body mechanics to reduce strain on lumbar spine    Status  Achieved   02/11/19       PT Long Term Goals - 02/07/19 0937      PT LONG TERM GOAL #1   Title  Patient will be independent with ongoing/advanced HEP    Status  On-going    Target Date  03/18/19      PT LONG TERM GOAL #2   Title  Patient will verbalize/demonstrate good awareness of neutral spine posture and proper body mechanics for daily tasks    Status  On-going    Target Date  03/18/19      PT LONG TERM GOAL #3   Title  Patient to improve lumbar AROM to WNL without pain provocation    Status  On-going    Target Date  03/18/19      PT LONG TERM GOAL #4   Title  Patient will demonstrate improved proximal strength to >/= 4/5 to 4+/5 for improved core stability    Status  On-going    Target Date  03/18/19      PT LONG TERM GOAL #5   Title  Patient  to report ability to perform ADLs, household and work-related tasks without increased low back pain    Status  On-going    Target Date  03/18/19      PT LONG TERM GOAL #6   Title  Patient will be able to resume exercising/working out without limitation due to low back pain    Status  On-going    Target Date  03/18/19            Plan - 02/25/19 1017    Clinical Impression Statement  Mary [name] reporting no pain this morning, noting only fatigue. Treatment focusing on continued core and proximal LE strengthening adding resisted 4-way standing SLR with abdominal bracing, progressing prone extension exercises and progressing supine exercises to green TB. Patient noting some fatigue with standing exercises and prone extension strengthening but no increased pain, with good tolerance noted for green TB. Patient remaining pain free by end of session.    Comorbidities  chronic LBP; HTN; recent WC PT for L foot & ankle sprain, L wrist sprain  resulting from a fall at work    Rehab Potential  Good    PT Frequency  2x / week    PT Duration  6 weeks    PT Treatment/Interventions  ADLs/Self Care Home Management;Cryotherapy;Electrical Stimulation;Iontophoresis 4mg /ml Dexamethasone;Moist Heat;Traction;Ultrasound;Functional mobility training;Therapeutic activities;Therapeutic exercise;Neuromuscular re-education;Patient/family education;Manual techniques;Passive range of motion;Dry needling;Taping;Spinal Manipulations;Joint Manipulations    PT Next Visit Plan  lumbopelvic flexibility and strengthening; manual therapy and modalities PRN    PT Home Exercise Plan  02/04/19 - SKTC, piriformis stretches, LTR, pelvic tilts, bed mobility with neutral spine; 02/11/19 - brace marching, hip flexion isometric, red TB hooklying clam, bridge + red TB hip ABD isometric    Consulted and Agree with Plan of Care  Patient       Patient will benefit from skilled therapeutic intervention in order to improve the following deficits  and impairments:  Decreased activity tolerance, Decreased knowledge of precautions, Decreased mobility, Decreased range of motion, Decreased  strength, Increased fascial restricitons, Increased muscle spasms, Impaired perceived functional ability, Impaired flexibility, Impaired sensation, Improper body mechanics, Postural dysfunction, Pain  Visit Diagnosis: Chronic bilateral low back pain without sciatica  Abnormal posture  Muscle weakness (generalized)  Other symptoms and signs involving the musculoskeletal system     Problem List Patient Active Problem List   Diagnosis Date Noted  . Hypertension 07/30/2018  . Trigger finger, left middle finger 07/12/2018  . Musculoskeletal back pain 12/01/2017  . Common migraine 06/07/2017  . Chronic lower back pain 12/21/2015  . Postmenopausal 05/15/2015  . Hyperlipidemia 11/17/2014  . 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 Obesity 11/17/2014  . Knee pain, right 04/09/2013  . Constipation 07/08/2009    Percival Spanish, PT, MPT 02/25/2019, 11:57 AM  [facility] [address]  [place], [place], [address] Phone: [phone]   Fax:  [phone]  Name: Mary [name] MRN: [id number] Date of Birth: January 05, 1966

## 2019-02-28 ENCOUNTER — Other Ambulatory Visit: Payer: Self-pay

## 2019-02-28 ENCOUNTER — Ambulatory Visit: Payer: 59

## 2019-02-28 DIAGNOSIS — R293 Abnormal posture: Secondary | ICD-10-CM | POA: Diagnosis not present

## 2019-02-28 DIAGNOSIS — R29898 Other symptoms and signs involving the musculoskeletal system: Secondary | ICD-10-CM | POA: Diagnosis not present

## 2019-02-28 DIAGNOSIS — M545 Low back pain, unspecified: Secondary | ICD-10-CM

## 2019-02-28 DIAGNOSIS — M6281 Muscle weakness (generalized): Secondary | ICD-10-CM

## 2019-02-28 DIAGNOSIS — G8929 Other chronic pain: Secondary | ICD-10-CM | POA: Diagnosis not present

## 2019-02-28 NOTE — Therapy (Signed)
[facility] [address]  [place], [place], [address] Phone: [phone]   Fax:  [phone]  Physical Therapy Treatment  Patient Details  Name: Mary [name] MRN: [id number] Date of Birth: [birth year] Referring Provider (PT): Gregor Hams, MD   Encounter Date: 02/28/2019  PT End of Session - 02/28/19 0940    Visit Number  8    Number of Visits  12    Date for PT Re-Evaluation  03/18/19    Authorization Type  [facility] - VL: [place]    PT Start Time  0934    PT Stop Time  1025   Ended visit with 10 min moist heat   PT Time Calculation (min)  51 min    Activity Tolerance  Patient tolerated treatment well    Behavior During Therapy  [facility] for tasks assessed/performed       Past Medical History:  Diagnosis Date  . Allergy   . Arthritis   . Headache(784.0)   . Hyperlipidemia     Past Surgical History:  Procedure Laterality Date  . CESAREAN SECTION      There were no vitals filed for this visit.  Subjective Assessment - 02/28/19 0938    Subjective  Pt. reporting some R buttocks pain which started last night without known trigger.  denies soreness after last session.    Diagnostic tests  Lumbar x-ray 03/07/18: Mild degenerative disc disease at L1-2 and facet joint hypertrophy at L5-S1. No compression fracture or spondylolisthesis.    Patient Stated Goals  "I want to be able to do exercise or work w/o pain all the time."    Currently in Pain?  Yes    Pain Score  3     Pain Location  Buttocks    Pain Orientation  Right    Pain Descriptors / Indicators  Aching    Pain Type  Acute pain    Pain Radiating Towards  R lateral thigh    Pain Onset  More than a month ago    Multiple Pain Sites  No                       [facility] Adult PT Treatment/Exercise - 02/28/19 0001      Lumbar Exercises: Stretches   Piriformis Stretch  Right;30 seconds;1 rep    Piriformis Stretch Limitations  seated     Figure 4  Stretch  1 rep;30 seconds    Figure 4 Stretch Limitations  R seated       Lumbar Exercises: Aerobic   Nustep  L5 x 7 min (UE/LE)      Knee/Hip Exercises: Standing   Hip Flexion  Right;Left;10 reps;Knee straight;Stengthening    Hip Flexion Limitations  yellow TB at ankle, cues for abd bracing, 1 pole A     Hip ADduction  Right;Left;10 reps;Strengthening    Hip ADduction Limitations  yellow TB at ankle, cues for abd bracing, 1 pole A     Hip Abduction  Right;Left;10 reps;Knee straight;Stengthening    Abduction Limitations  yellow TB at ankle, cues for abd bracing, 1 pole A     Hip Extension  Right;Left;10 reps;Knee straight;Stengthening    Extension Limitations  yellow TB at ankle, cues for abd bracing, 1 pole A       Moist Heat Therapy   Number Minutes Moist Heat  10 Minutes    Moist Heat Location  Hip   R  Manual Therapy   Manual Therapy  Soft tissue mobilization;Myofascial release    Manual therapy comments  L sidelying with R LE elevated on bolster     Soft tissue mobilization  STM/DTM to R glute med in area of reported tenderness     Myofascial Release  Manual TPR to R glute med             PT Education - 02/28/19 1024    Education Details  HEP update;  4-way hip kicker with yellow TB issued to pt.    Person(s) Educated  Patient    Methods  Explanation;Demonstration;Verbal cues;Handout    Comprehension  Verbalized understanding;Returned demonstration;Verbal cues required       PT Short Term Goals - 02/18/19 0936      PT SHORT TERM GOAL #1   Title  Patient will be independent with initial HEP    Status  Achieved   02/11/19     PT SHORT TERM GOAL #2   Title  Patient will verbalize/demonstrate understanding of neutral spine posture and proper body mechanics to reduce strain on lumbar spine    Status  Achieved   02/11/19       PT Long Term Goals - 02/07/19 0937      PT LONG TERM GOAL #1   Title  Patient will be independent with ongoing/advanced HEP     Status  On-going    Target Date  03/18/19      PT LONG TERM GOAL #2   Title  Patient will verbalize/demonstrate good awareness of neutral spine posture and proper body mechanics for daily tasks    Status  On-going    Target Date  03/18/19      PT LONG TERM GOAL #3   Title  Patient to improve lumbar AROM to WNL without pain provocation    Status  On-going    Target Date  03/18/19      PT LONG TERM GOAL #4   Title  Patient will demonstrate improved proximal strength to >/= 4/5 to 4+/5 for improved core stability    Status  On-going    Target Date  03/18/19      PT LONG TERM GOAL #5   Title  Patient to report ability to perform ADLs, household and work-related tasks without increased low back pain    Status  On-going    Target Date  03/18/19      PT LONG TERM GOAL #6   Title  Patient will be able to resume exercising/working out without limitation due to low back pain    Status  On-going    Target Date  03/18/19            Plan - 02/28/19 0945    Clinical Impression Statement  Mary [name] denies soreness after last visit.  Tolerated continued 4-way hip kicker with yellow TB at ankle + abdom. bracing well without pain.  updated HEP with 4-way hip kicker with yellow TB for home performance.  MT addressing complaint of R posterior/lateral hip pain revealing increased tension in glute Medius.  Ended visit with trial of moist heat for improved R lateral glute comfort/relaxation.  Pt. noting relief following heat.  Pt. reporting improved tolerated for daily tasks around home progressing toward LTG #5.    Comorbidities  chronic LBP; HTN; recent WC PT for L foot & ankle sprain, L wrist sprain  resulting from a fall at work    Rehab Potential  Good    PT Treatment/Interventions  ADLs/Self Care Home Management;Cryotherapy;Electrical Stimulation;Iontophoresis 4mg /ml Dexamethasone;Moist Heat;Traction;Ultrasound;Functional mobility training;Therapeutic activities;Therapeutic exercise;Neuromuscular  re-education;Patient/family education;Manual techniques;Passive range of motion;Dry needling;Taping;Spinal Manipulations;Joint Manipulations    PT Next Visit Plan  lumbopelvic flexibility and strengthening; manual therapy and modalities PRN    PT Home Exercise Plan  02/04/19 - SKTC, piriformis stretches, LTR, pelvic tilts, bed mobility with neutral spine; 02/11/19 - brace marching, hip flexion isometric, red TB hooklying clam, bridge + red TB hip ABD isometric;  02/28/19 - 4-way hip kicker with yellow TB    Consulted and Agree with Plan of Care  Patient       Patient will benefit from skilled therapeutic intervention in order to improve the following deficits and impairments:  Decreased activity tolerance, Decreased knowledge of precautions, Decreased mobility, Decreased range of motion, Decreased strength, Increased fascial restricitons, Increased muscle spasms, Impaired perceived functional ability, Impaired flexibility, Impaired sensation, Improper body mechanics, Postural dysfunction, Pain  Visit Diagnosis: Chronic bilateral low back pain without sciatica  Abnormal posture  Muscle weakness (generalized)  Other symptoms and signs involving the musculoskeletal system     Problem List Patient Active Problem List   Diagnosis Date Noted  . Hypertension 07/30/2018  . Trigger finger, left middle finger 07/12/2018  . Musculoskeletal back pain 12/01/2017  . Common migraine 06/07/2017  . Chronic lower back pain 12/21/2015  . Postmenopausal 05/15/2015  . Hyperlipidemia 11/17/2014  . 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 Obesity 11/17/2014  . Knee pain, right 04/09/2013  . Constipation 07/08/2009    Bess Harvest, PTA 02/28/19 12:07 PM    [facility] [address]  [place], [place], [address] Phone: [phone]   Fax:  [phone]  Name: Mary [name] MRN: [id number] Date of Birth: 06/11/1965

## 2019-03-04 ENCOUNTER — Other Ambulatory Visit: Payer: Self-pay

## 2019-03-04 ENCOUNTER — Ambulatory Visit: Payer: 59 | Admitting: Physical Therapy

## 2019-03-04 ENCOUNTER — Encounter: Payer: Self-pay | Admitting: Physical Therapy

## 2019-03-04 DIAGNOSIS — R29898 Other symptoms and signs involving the musculoskeletal system: Secondary | ICD-10-CM

## 2019-03-04 DIAGNOSIS — M6281 Muscle weakness (generalized): Secondary | ICD-10-CM | POA: Diagnosis not present

## 2019-03-04 DIAGNOSIS — R293 Abnormal posture: Secondary | ICD-10-CM

## 2019-03-04 DIAGNOSIS — G8929 Other chronic pain: Secondary | ICD-10-CM | POA: Diagnosis not present

## 2019-03-04 DIAGNOSIS — M545 Low back pain, unspecified: Secondary | ICD-10-CM

## 2019-03-04 NOTE — Therapy (Signed)
[facility] [address]  [place], [place], [address] Phone: [phone]   Fax:  [phone]  Physical Therapy Treatment / Progress Note  Patient Details  Name: Mary [name] MRN: [id number] Date of Birth: 06/30/1965 Referring Provider (PT): Gregor Hams, MD   Encounter Date: 03/04/2019  PT End of Session - 03/04/19 0931    Visit Number  9    Number of Visits  12    Date for PT Re-Evaluation  03/18/19    Authorization Type  [facility] - VL: [place]    PT Start Time  0931    PT Stop Time  1017    PT Time Calculation (min)  46 min    Activity Tolerance  Patient tolerated treatment well    Behavior During Therapy  [facility] for tasks assessed/performed       Past Medical History:  Diagnosis Date  . Allergy   . Arthritis   . Headache(784.0)   . Hyperlipidemia     Past Surgical History:  Procedure Laterality Date  . CESAREAN SECTION      There were no vitals filed for this visit.  Subjective Assessment - 03/04/19 0934    Subjective  Pt reporting more pain in B buttocks recently with some radicular pain into posterior thighs.    How long can you walk comfortably?  30-35 minutes    Diagnostic tests  Lumbar x-ray 03/07/18: Mild degenerative disc disease at L1-2 and facet joint hypertrophy at L5-S1. No compression fracture or spondylolisthesis.    Patient Stated Goals  "I want to be able to do exercise or work w/o pain all the time."    Currently in Pain?  Yes    Pain Score  2    3-4/10 at worst   Pain Location  Buttocks    Pain Orientation  Right;Left    Pain Descriptors / Indicators  Aching    Pain Type  Acute pain    Pain Radiating Towards  down back of B thighs while walking    Pain Frequency  Intermittent    Aggravating Factors   mopping at work    Pain Relieving Factors  stretching         [facility] PT Assessment - 03/04/19 0931      Assessment   Medical Diagnosis  Lumbosacral sprain    Referring Provider  (PT)  Gregor Hams, MD    Onset Date/Surgical Date  --   ~2 months   Hand Dominance  Right    Next MD Visit  03/06/19      AROM   Lumbar Flexion  WFL - finger tips to floor    Lumbar Extension  [facility]    Lumbar - Right Side Bend  [facility]    Lumbar - Left Side Bend  [facility]    Lumbar - Right Rotation  10% limited   no pain   Lumbar - Left Rotation  [facility]      Strength   Right Hip Flexion  4+/5    Right Hip Extension  4/5    Right Hip External Rotation   4/5    Right Hip Internal Rotation  4+/5    Right Hip ABduction  4/5    Right Hip ADduction  4-/5    Left Hip Flexion  4+/5    Left Hip Extension  4/5    Left Hip External Rotation  4-/5    Left Hip Internal Rotation  4+/5  Left Hip ABduction  4/5    Left Hip ADduction  4-/5    Right Knee Flexion  4+/5    Right Knee Extension  5/5    Left Knee Flexion  4+/5    Left Knee Extension  5/5                   [facility] Adult PT Treatment/Exercise - 03/04/19 0931      Exercises   Exercises  Lumbar      Lumbar Exercises: Stretches   Piriformis Stretch  Right;Left;30 seconds;2 reps    Piriformis Stretch Limitations  supine & seated KTOS with ankle propped on opp knee    Figure 4 Stretch  10 seconds;1 rep;Supine    Figure 4 Stretch Limitations  deferred as pt feels it is too difficult      Lumbar Exercises: Aerobic   Nustep  L5 x 6 min (UE/LE)   seat #8     Manual Therapy   Manual Therapy  Soft tissue mobilization;Myofascial release;Other (comment)    Manual therapy comments  L/R sidelying    Soft tissue mobilization  STM/DTM to B glutes & piriformis    Myofascial Release  Manual TPR to B piriformis    Other Manual Therapy  Instructed pt in seated alterantive using tennis ball for piriformis self-STM as she reports she is unable to get tennis ball low enough on wall to targert tight/tender area w/o the ball falling out.               PT Short Term Goals - 02/18/19 0936      PT SHORT TERM GOAL #1   Title  Patient  will be independent with initial HEP    Status  Achieved   02/11/19     PT SHORT TERM GOAL #2   Title  Patient will verbalize/demonstrate understanding of neutral spine posture and proper body mechanics to reduce strain on lumbar spine    Status  Achieved   02/11/19       PT Long Term Goals - 03/04/19 0938      PT LONG TERM GOAL #1   Title  Patient will be independent with ongoing/advanced HEP    Status  Partially Met   met for current HEP   Target Date  03/18/19      PT LONG TERM GOAL #2   Title  Patient will verbalize/demonstrate good awareness of neutral spine posture and proper body mechanics for daily tasks    Status  Partially Met   03/04/19 - overall more aware of posture but still notes tendency to slouch when sitting and 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   Target Date  03/18/19      PT LONG TERM GOAL #3   Title  Patient to improve lumbar AROM to WNL without pain provocation    Status  Achieved   03/04/19   Target Date  --      PT LONG TERM GOAL #4   Title  Patient will demonstrate improved proximal strength to >/= 4/5 to 4+/5 for improved core stability    Status  Partially Met    Target Date  03/18/19      PT LONG TERM GOAL #5   Title  Patient to report ability to perform ADLs, household and work-related tasks without increased low back pain    Status  Partially Met    Target Date  03/18/19      PT LONG TERM  GOAL #6   Title  Patient will be able to resume exercising/working out without limitation due to low back pain    Status  Partially Met   03/04/19 - has started walking 30-35 minutes at a time for exercise w/o limitation due to LBP   Target Date  03/18/19            Plan - 03/04/19 1017    Clinical Impression Statement  Elder Negus reporting 30% improvement with PT thus far. Low back pain improved with pain now mostly localized to the B lower buttocks with continued ttp and increased muscle tension persisting in B piriformis - relief  noted after manual therapy today. Lumbar ROM now essentially WFL/WNL with only very mild limitation noted in R rotation as compared to L. LE strength improving with average LE strength increased by at least  MMT grade and greatest remaining weakness in B hip adduction and ER and to a lesser degree in extension and abduction. 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 She is progressing well toward goals with all STGs met and all LTGs at least partially met at this time.    Personal Factors and Comorbidities  Profession;Comorbidity 3+    Comorbidities  chronic LBP; HTN; recent WC PT for L foot & ankle sprain, L wrist sprain  resulting from a fall at work    Examination-Participation Restrictions  Cleaning    Rehab Potential  Good    PT Frequency  2x / week    PT Duration  6 weeks    PT Treatment/Interventions  ADLs/Self Care Home Management;Cryotherapy;Electrical Stimulation;Iontophoresis '4mg'$ /ml Dexamethasone;Moist Heat;Traction;Ultrasound;Functional mobility training;Therapeutic activities;Therapeutic exercise;Neuromuscular re-education;Patient/family education;Manual techniques;Passive range of motion;Dry needling;Taping;Spinal Manipulations;Joint Manipulations    PT Next Visit Plan  10th visit FOTO; lumbopelvic flexibility and strengthening; manual therapy and modalities PRN    PT Home Exercise Plan  02/04/19 - SKTC, piriformis stretches, LTR, pelvic tilts, bed mobility with neutral spine; 02/11/19 - brace marching, hip flexion isometric, red TB hooklying clam, bridge + red TB hip ABD isometric;  02/28/19 - 4-way hip kicker with yellow TB    Consulted and Agree with Plan of Care  Patient       Patient will benefit from skilled therapeutic intervention in order to improve the following deficits and impairments:  Decreased activity  tolerance, Decreased knowledge of precautions, Decreased mobility, Decreased range of motion, Decreased strength, Increased fascial restricitons, Increased muscle spasms, Impaired perceived functional ability, Impaired flexibility, Impaired sensation, Improper body mechanics, Postural dysfunction, Pain  Visit Diagnosis: Chronic bilateral low back pain without sciatica  Abnormal posture  Muscle weakness (generalized)  Other symptoms and signs involving the musculoskeletal system     Problem List Patient Active Problem List   Diagnosis Date Noted  . Hypertension 07/30/2018  . Trigger finger, left middle finger 07/12/2018  . Musculoskeletal back pain 12/01/2017  . Common migraine 06/07/2017  . Chronic lower back pain 12/21/2015  . Postmenopausal 05/15/2015  . Hyperlipidemia 11/17/2014  . 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 Obesity 11/17/2014  . Knee pain, right 04/09/2013  . Constipation 07/08/2009    Percival Spanish, PT, MPT 03/04/2019, 12:42 PM  [facility] [address]  [place], [place], [address] Phone: [phone]   Fax:  [phone]  Name: DYLANA SHAW MRN: [id number] Date of Birth:  10/16/1965   

## 2019-03-06 ENCOUNTER — Ambulatory Visit (INDEPENDENT_AMBULATORY_CARE_PROVIDER_SITE_OTHER): Payer: 59 | Admitting: Family Medicine

## 2019-03-06 ENCOUNTER — Other Ambulatory Visit: Payer: Self-pay

## 2019-03-06 ENCOUNTER — Encounter: Payer: Self-pay | Admitting: Family Medicine

## 2019-03-06 VITALS — BP 128/84 | HR 58 | Ht 66.0 in | Wt 195.8 lb

## 2019-03-06 DIAGNOSIS — G8929 Other chronic pain: Secondary | ICD-10-CM | POA: Diagnosis not present

## 2019-03-06 DIAGNOSIS — M545 Low back pain: Secondary | ICD-10-CM

## 2019-03-06 NOTE — Progress Notes (Signed)
   I, Mary [name], LAT, ATC, am serving as scribe for Dr. Lynne Leader.  Mary [name] is a 54 y.o. female who presents to [facility] today for f/u of low back pain and L post-lat thigh pain.  She was last seen by Dr. Georgina Snell on 01/30/19 and was referred to outpatient PT.  She has completed 9 PT sessions and notes approximately 30% improvement in her symptoms.  Since her last visit w/ Dr. Georgina Snell, pt reports slight improvement in her low back pain.  She states that she is not currently having any pain in her L leg.  Aggravating factors include mopping the floor, forward flexion of her lumbar spine and transitioning from sit-to-stand.  She has been doing her HEP as prescribed by PT.   Pertinent review of systems: No fevers or chills.  Relevant historical information: Hypertension   Exam:  BP 128/84 (BP Location: Left Arm, Patient Position: Sitting, Cuff Size: Large)   Pulse (!) 58   Ht 5\' 6"  (1.676 m)   Wt 195 lb 12.8 oz (88.8 kg)   SpO2 99%   BMI 31.60 kg/m  General: Well Developed, well nourished, and in no acute distress.   MSK: L-spine: Normal-appearing.   Nontender spinal midline.  Tender right lumbar paraspinal musculature. Normal motion. Lower extremity strength intact. Normal gait.     Assessment and Plan: 54 y.o. female with moderately improved low back pain.  Patient had significant trial of physical therapy with moderate improvement.  She continues to have residual pain.  When asked she states that her pain is manageable and not bothersome enough that she wishes to pursue next steps which would be likely MRI and facet injections.  She is happy to leave things as they are and continue home exercise program.  Recheck back with me as needed.   Total encounter time 20 minutes including charting time date of service.    Discussed warning signs or symptoms. Please see discharge instructions. Patient expresses understanding.   The above  documentation has been reviewed and is accurate and complete Lynne Leader

## 2019-03-06 NOTE — Patient Instructions (Signed)
Thank you for coming in today. Continue home exercises taught by PT.  Continue tylenol and infrequent meloxicam.  We could do more if needed.  Please keep me updated.  Come back or go to the emergency room if you notice new weakness new numbness problems walking or bowel or bladder problems.

## 2019-03-07 ENCOUNTER — Ambulatory Visit: Payer: 59

## 2019-03-11 ENCOUNTER — Encounter: Payer: Self-pay | Admitting: Physical Therapy

## 2019-03-11 ENCOUNTER — Ambulatory Visit: Payer: 59 | Admitting: Physical Therapy

## 2019-03-11 ENCOUNTER — Other Ambulatory Visit: Payer: Self-pay

## 2019-03-11 DIAGNOSIS — M545 Low back pain, unspecified: Secondary | ICD-10-CM

## 2019-03-11 DIAGNOSIS — R29898 Other symptoms and signs involving the musculoskeletal system: Secondary | ICD-10-CM

## 2019-03-11 DIAGNOSIS — G8929 Other chronic pain: Secondary | ICD-10-CM | POA: Diagnosis not present

## 2019-03-11 DIAGNOSIS — R293 Abnormal posture: Secondary | ICD-10-CM

## 2019-03-11 DIAGNOSIS — M6281 Muscle weakness (generalized): Secondary | ICD-10-CM

## 2019-03-11 NOTE — Therapy (Signed)
[facility] [place] [address]  [place], [place], [address] Phone: [phone]   Fax:  [phone]  Physical Therapy Treatment  Patient Details  Name: Mary [name] MRN: [id number] Date of Birth: 1965/04/10 Referring Provider (PT): Gregor Hams, MD   Encounter Date: 03/11/2019  PT End of Session - 03/11/19 0932    Visit Number  10    Number of Visits  12    Date for PT Re-Evaluation  03/18/19    Authorization Type  [facility] - VL: [place]    PT Start Time  0932    PT Stop Time  1017    PT Time Calculation (min)  45 min    Activity Tolerance  Patient tolerated treatment well    Behavior During Therapy  [facility] for tasks assessed/performed       Past Medical History:  Diagnosis Date  . Allergy   . Arthritis   . Headache(784.0)   . Hyperlipidemia     Past Surgical History:  Procedure Laterality Date  . CESAREAN SECTION      There were no vitals filed for this visit.  Subjective Assessment - 03/11/19 0934    Subjective  Pt reporting no pain today. MD released her to f/u only as needed.    How long can you walk comfortably?  30-35 minutes    Diagnostic tests  Lumbar x-ray 03/07/18: Mild degenerative disc disease at L1-2 and facet joint hypertrophy at L5-S1. No compression fracture or spondylolisthesis.    Patient Stated Goals  "I want to be able to do exercise or work w/o pain all the time."    Currently in Pain?  No/denies         [facility] PT Assessment - 03/11/19 0932      Assessment   Medical Diagnosis  Lumbosacral sprain    Referring Provider (PT)  Gregor Hams, MD    Next MD Visit  PRN      Observation/Other Assessments   Focus on Therapeutic Outcomes (FOTO)   Lumbar - 94% (6% limitation)                   [facility] Adult PT Treatment/Exercise - 03/11/19 0932      Lumbar Exercises: Stretches   Single Knee to Chest Stretch  Right;Left;30 seconds;2 reps    Single Knee to Chest Stretch Limitations  opp  LE straight, cues to hold behind knee rather than on top    Lower Trunk Rotation  30 seconds;3 reps    Piriformis Stretch  Right;Left;30 seconds;2 reps    Piriformis Stretch Limitations  supine KTOS    Figure 4 Stretch  30 seconds;1 rep;Supine;With overpressure    Figure 4 Stretch Limitations  figure 4 with downward pressure on knee      Lumbar Exercises: Aerobic   Nustep  L5 x 6 min (UE/LE)   seat #8     Lumbar Exercises: Supine   Bent Knee Raise  10 reps;3 seconds    Bent Knee Raise Limitations  brace marching with looped green TB at knees    Bridge with clamshell  10 reps;5 seconds    Bridge with [employer] Limitations  + green TB alt hip ABD/ER      Knee/Hip Exercises: Standing   Hip Flexion  Right;Left;10 reps;Knee straight;Stengthening    Hip Flexion Limitations  yellow TB at ankle, cues for abd bracing, 1 pole A     Hip ADduction  Right;Left;10 reps;Strengthening  Hip ADduction Limitations  yellow TB at ankle, cues for abd bracing, 1 pole A     Hip Abduction  Right;Left;10 reps;Knee straight;Stengthening    Abduction Limitations  yellow TB at ankle, cues for abd bracing, 1 pole A     Hip Extension  Right;Left;10 reps;Knee straight;Stengthening    Extension Limitations  yellow TB at ankle, cues for abd bracing, 1 pole A                PT Short Term Goals - 02/18/19 0936      PT SHORT TERM GOAL #1   Title  Patient will be independent with initial HEP    Status  Achieved   02/11/19     PT SHORT TERM GOAL #2   Title  Patient will verbalize/demonstrate understanding of neutral spine posture and proper body mechanics to reduce strain on lumbar spine    Status  Achieved   02/11/19       PT Long Term Goals - 03/04/19 0938      PT LONG TERM GOAL #1   Title  Patient will be independent with ongoing/advanced HEP    Status  Partially Met   met for current HEP   Target Date  03/18/19      PT LONG TERM GOAL #2   Title  Patient will verbalize/demonstrate good  awareness of neutral spine posture and proper body mechanics for daily tasks    Status  Partially Met   03/04/19 - overall more aware of posture but still notes tendency to slouch when sitting and 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   Target Date  03/18/19      PT LONG TERM GOAL #3   Title  Patient to improve lumbar AROM to WNL without pain provocation    Status  Achieved   03/04/19   Target Date  --      PT LONG TERM GOAL #4   Title  Patient will demonstrate improved proximal strength to >/= 4/5 to 4+/5 for improved core stability    Status  Partially Met    Target Date  03/18/19      PT LONG TERM GOAL #5   Title  Patient to report ability to perform ADLs, household and work-related tasks without increased low back pain    Status  Partially Met    Target Date  03/18/19      PT LONG TERM GOAL #6   Title  Patient will be able to resume exercising/working out without limitation due to low back pain    Status  Partially Met   03/04/19 - has started walking 30-35 minutes at a time for exercise w/o limitation due to LBP   Target Date  03/18/19            Plan - 03/11/19 0936    Clinical Impression Statement  Mary [name] reporting follow-up with MD went well and he has released her to follow up only as needed. Lumbar FOTO revealing significant improvement to 94% (6% limitation). Patient feeling like she will be ready to transition to HEP as of end of current POC in 2 visits, therefore initiated HEP review and update, progressing/combining some exercises and eliminating some of the more basic initial exercise. Will plan to complete HEP review/update next visit based on responses to today's changes and anticipate discharge on final visit.    Personal Factors and Comorbidities  Profession;Comorbidity 3+    Comorbidities  chronic LBP; HTN; recent WC PT  for L foot & ankle sprain, L wrist sprain  resulting from a fall at work    Examination-Participation Restrictions  Cleaning     Rehab Potential  Good    PT Frequency  2x / week    PT Duration  6 weeks    PT Treatment/Interventions  ADLs/Self Care Home Management;Cryotherapy;Electrical Stimulation;Iontophoresis 74m/ml Dexamethasone;Moist Heat;Traction;Ultrasound;Functional mobility training;Therapeutic activities;Therapeutic exercise;Neuromuscular re-education;Patient/family education;Manual techniques;Passive range of motion;Dry needling;Taping;Spinal Manipulations;Joint Manipulations    PT Next Visit Plan  final HEP review/update; lumbopelvic flexibility and strengthening; manual therapy and modalities PRN    PT Home Exercise Plan  02/04/19 - SKTC, piriformis stretches, LTR, pelvic tilts, bed mobility with neutral spine; 02/11/19 - brace marching, hip flexion isometric, red TB hooklying clam, bridge + red TB hip ABD isometric;  02/28/19 - 4-way hip kicker with yellow TB    Consulted and Agree with Plan of Care  Patient       Patient will benefit from skilled therapeutic intervention in order to improve the following deficits and impairments:  Decreased activity tolerance, Decreased knowledge of precautions, Decreased mobility, Decreased range of motion, Decreased strength, Increased fascial restricitons, Increased muscle spasms, Impaired perceived functional ability, Impaired flexibility, Impaired sensation, Improper body mechanics, Postural dysfunction, Pain  Visit Diagnosis: Chronic bilateral low back pain without sciatica  Abnormal posture  Muscle weakness (generalized)  Other symptoms and signs involving the musculoskeletal system     Problem List Patient Active Problem List   Diagnosis Date Noted  . Hypertension 07/30/2018  . Trigger finger, left middle finger 07/12/2018  . Musculoskeletal back pain 12/01/2017  . Common migraine 06/07/2017  . Chronic low back pain 12/21/2015  . Postmenopausal 05/15/2015  . Hyperlipidemia 11/17/2014  . 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 Obesity 11/17/2014  . Knee pain, right  04/09/2013  . Constipation 07/08/2009    JPercival Spanish PT, MPT 03/11/2019, 8:36 PM  [facility] [place] [address]Phone: [phone]  Fax:  [phone] Name: Mary LEMKEMRN: [id number] of Birth: 6March 05, 1967

## 2019-03-14 ENCOUNTER — Encounter: Payer: Self-pay | Admitting: Physical Therapy

## 2019-03-14 ENCOUNTER — Ambulatory Visit: Payer: 59 | Admitting: Physical Therapy

## 2019-03-14 ENCOUNTER — Other Ambulatory Visit: Payer: Self-pay

## 2019-03-14 DIAGNOSIS — G8929 Other chronic pain: Secondary | ICD-10-CM | POA: Diagnosis not present

## 2019-03-14 DIAGNOSIS — M545 Low back pain, unspecified: Secondary | ICD-10-CM

## 2019-03-14 DIAGNOSIS — R29898 Other symptoms and signs involving the musculoskeletal system: Secondary | ICD-10-CM | POA: Diagnosis not present

## 2019-03-14 DIAGNOSIS — M6281 Muscle weakness (generalized): Secondary | ICD-10-CM

## 2019-03-14 DIAGNOSIS — R293 Abnormal posture: Secondary | ICD-10-CM | POA: Diagnosis not present

## 2019-03-14 NOTE — Patient Instructions (Signed)
    Home exercise program created by Kalif Kattner, PT.  For questions, please contact Kimani Hovis via phone at [phone] or email at [email or web address]  [facility] [place] [facility] [address]  [address] [place], [place], [address] Phone: [phone]   Fax:  [phone]    

## 2019-03-14 NOTE — Therapy (Signed)
[facility] [place] [address]  [place], [place], [address] Phone: [phone]   Fax:  [phone]  Physical Therapy Treatment  Patient Details  Name: Mary [name] MRN: [id number] Date of Birth: Oct 01, 1965 Referring Provider (PT): Gregor Hams, MD   Encounter Date: 03/14/2019  PT End of Session - 03/14/19 0935    Visit Number  11    Number of Visits  12    Date for PT Re-Evaluation  03/18/19    Authorization Type  [facility] - VL: [place]    PT Start Time  0935    PT Stop Time  1021    PT Time Calculation (min)  46 min    Activity Tolerance  Patient tolerated treatment well    Behavior During Therapy  [facility] for tasks assessed/performed       Past Medical History:  Diagnosis Date  . Allergy   . Arthritis   . Headache(784.0)   . Hyperlipidemia     Past Surgical History:  Procedure Laterality Date  . CESAREAN SECTION      There were no vitals filed for this visit.  Subjective Assessment - 03/14/19 0938    Subjective  Pt noting some discomfort with standing hip abduction from last visit which seemed to linger for a while after visit, but now resolved.    How long can you walk comfortably?  --    Diagnostic tests  Lumbar x-ray 03/07/18: Mild degenerative disc disease at L1-2 and facet joint hypertrophy at L5-S1. No compression fracture or spondylolisthesis.    Patient Stated Goals  "I want to be able to do exercise or work w/o pain all the time."    Currently in Pain?  No/denies                       [facility] Adult PT Treatment/Exercise - 03/14/19 0935      Lumbar Exercises: Stretches   Prone on Elbows Stretch  30 seconds;2 reps    Press Ups  10 seconds;5 reps    Piriformis Stretch  Right;Left;30 seconds;2 reps    Piriformis Stretch Limitations  supine KTOS - opposite LE straight      Lumbar Exercises: Aerobic   Nustep  L5 x 6 min (UE/LE)   seat #8     Lumbar Exercises: Standing   Row  Both;10  reps;Strengthening;Theraband    Theraband Level (Row)  Level 2 (Red)    Row Limitations  cues for abd bracing & scap retraction    Shoulder Extension  Both;10 reps;Strengthening;Theraband    Theraband Level (Shoulder Extension)  Level 2 (Red)    Shoulder Extension Limitations  cues for abd bracing & scap retraction - pt noting better activation in staggered stance    Other Standing Lumbar Exercises  B pallof press with red TB 10 x 5"      Lumbar Exercises: Supine   Bridge with clamshell  10 reps;5 seconds    Bridge with Ball Squeeze Limitations  + green TB alt hip ABD/ER             PT Education - 03/14/19 1021    Education Details  HEP review/update - refer to Pt Instructions    Person(s) Educated  Patient    Methods  Explanation;Demonstration;Handout;Verbal cues;Tactile cues    Comprehension  Verbalized understanding;Returned demonstration;Tactile cues required;Verbal cues required       PT Short Term Goals - 02/18/19 7972  PT SHORT TERM GOAL #1   Title  Patient will be independent with initial HEP    Status  Achieved   02/11/19     PT SHORT TERM GOAL #2   Title  Patient will verbalize/demonstrate understanding of neutral spine posture and proper body mechanics to reduce strain on lumbar spine    Status  Achieved   02/11/19       PT Long Term Goals - 03/04/19 0938      PT LONG TERM GOAL #1   Title  Patient will be independent with ongoing/advanced HEP    Status  Partially Met   met for current HEP   Target Date  03/18/19      PT LONG TERM GOAL #2   Title  Patient will verbalize/demonstrate good awareness of neutral spine posture and proper body mechanics for daily tasks    Status  Partially Met   03/04/19 - overall more aware of posture but still notes tendency to slouch when sitting and 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   Target Date  03/18/19      PT LONG TERM GOAL #3   Title  Patient to improve lumbar AROM to WNL without pain  provocation    Status  Achieved   03/04/19   Target Date  --      PT LONG TERM GOAL #4   Title  Patient will demonstrate improved proximal strength to >/= 4/5 to 4+/5 for improved core stability    Status  Partially Met    Target Date  03/18/19      PT LONG TERM GOAL #5   Title  Patient to report ability to perform ADLs, household and work-related tasks without increased low back pain    Status  Partially Met    Target Date  03/18/19      PT LONG TERM GOAL #6   Title  Patient will be able to resume exercising/working out without limitation due to low back pain    Status  Partially Met   03/04/19 - has started walking 30-35 minutes at a time for exercise w/o limitation due to LBP   Target Date  03/18/19            Plan - 03/14/19 0937    Clinical Impression Statement  [place] reporting no issues with HEP review from last visit other than some lingering discomfort associated with one of the standing 4-way SLRs (unsure which direction). Completed HEP review and update today providing update and summarized HEP handout with patient denying any issues with exercises completed today. Will plan for discharge assessment next visit, addressing any remaining concern with HEP prior to transitioning to HEP +/- 30-day hold.    Personal Factors and Comorbidities  Profession;Comorbidity 3+    Comorbidities  chronic LBP; HTN; recent WC PT for L foot & ankle sprain, L wrist sprain  resulting from a fall at work    Examination-Participation Restrictions  Cleaning    Rehab Potential  Good    PT Frequency  2x / week    PT Duration  6 weeks    PT Treatment/Interventions  ADLs/Self Care Home Management;Cryotherapy;Electrical Stimulation;Iontophoresis 52m/ml Dexamethasone;Moist Heat;Traction;Ultrasound;Functional mobility training;Therapeutic activities;Therapeutic exercise;Neuromuscular re-education;Patient/family education;Manual techniques;Passive range of motion;Dry needling;Taping;Spinal  Manipulations;Joint Manipulations    PT Next Visit Plan  discharge assessment with transition to HEP +/- 30-day hold    PT Home Exercise Plan  03/14/19 (consolidated HEP) - SKTC, piriformis stretches, LTR, McKenzie prone press-up, green TB  brace marching, green TB bridge + clam, low doorway pec stretch, red TB low rows, red TB pallof presss    Consulted and Agree with Plan of Care  Patient       Patient will benefit from skilled therapeutic intervention in order to improve the following deficits and impairments:  Decreased activity tolerance, Decreased knowledge of precautions, Decreased mobility, Decreased range of motion, Decreased strength, Increased fascial restricitons, Increased muscle spasms, Impaired perceived functional ability, Impaired flexibility, Impaired sensation, Improper body mechanics, Postural dysfunction, Pain  Visit Diagnosis: Chronic bilateral low back pain without sciatica  Abnormal posture  Muscle weakness (generalized)  Other symptoms and signs involving the musculoskeletal system     Problem List Patient Active Problem List   Diagnosis Date Noted  . Hypertension 07/30/2018  . Trigger finger, left middle finger 07/12/2018  . Musculoskeletal back pain 12/01/2017  . Common migraine 06/07/2017  . Chronic low back pain 12/21/2015  . Postmenopausal 05/15/2015  . Hyperlipidemia 11/17/2014  . 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 Obesity 11/17/2014  . Knee pain, right 04/09/2013  . Constipation 07/08/2009    Percival Spanish, PT, MPT 03/14/2019, 11:53 AM  [facility] [address]  [place], [place], [address] Phone: [phone]   Fax:  [phone]  Name: Mary [name] MRN: [id number] Date of Birth: August 13, 1965

## 2019-03-15 DIAGNOSIS — H524 Presbyopia: Secondary | ICD-10-CM | POA: Diagnosis not present

## 2019-03-18 ENCOUNTER — Encounter: Payer: Self-pay | Admitting: Physical Therapy

## 2019-03-18 ENCOUNTER — Ambulatory Visit: Payer: 59 | Attending: Family Medicine | Admitting: Physical Therapy

## 2019-03-18 ENCOUNTER — Other Ambulatory Visit: Payer: Self-pay

## 2019-03-18 DIAGNOSIS — M6281 Muscle weakness (generalized): Secondary | ICD-10-CM | POA: Diagnosis not present

## 2019-03-18 DIAGNOSIS — R29898 Other symptoms and signs involving the musculoskeletal system: Secondary | ICD-10-CM | POA: Insufficient documentation

## 2019-03-18 DIAGNOSIS — M545 Low back pain, unspecified: Secondary | ICD-10-CM

## 2019-03-18 DIAGNOSIS — R293 Abnormal posture: Secondary | ICD-10-CM | POA: Diagnosis not present

## 2019-03-18 DIAGNOSIS — G8929 Other chronic pain: Secondary | ICD-10-CM | POA: Insufficient documentation

## 2019-03-18 NOTE — Patient Instructions (Signed)
    Home exercise program created by Jaylend Reiland, PT.  For questions, please contact Keatyn Jawad via phone at [phone] or email at [email or web address]  [facility] [place] [address]  [address] [place], [place], [address] Phone: [phone]   Fax:  [phone]    

## 2019-03-18 NOTE — Therapy (Addendum)
[facility] [address]  [place], [place], [address] Phone: [phone]   Fax:  [phone]  Physical Therapy Treatment / Discharge Summary  Patient Details  Name: Mary [name] MRN: [id number] Date of Birth: 1965/07/28 Referring Provider (PT): Gregor Hams, MD   Encounter Date: 03/18/2019  PT End of Session - 03/18/19 0932    Visit Number  12    Number of Visits  12    Date for PT Re-Evaluation  03/18/19    Authorization Type  [facility] - VL: [place]    PT Start Time  0932    PT Stop Time  1010    PT Time Calculation (min)  38 min    Activity Tolerance  Patient tolerated treatment well    Behavior During Therapy  [employer] for tasks assessed/performed       Past Medical History:  Diagnosis Date  . Allergy   . Arthritis   . Headache(784.0)   . Hyperlipidemia     Past Surgical History:  Procedure Laterality Date  . CESAREAN SECTION      There were no vitals filed for this visit.  Subjective Assessment - 03/18/19 0937    Subjective  Pt reporting some confusion with just one of latest HEP but otherwise tolerating HEP well.    Diagnostic tests  Lumbar x-ray 03/07/18: Mild degenerative disc disease at L1-2 and facet joint hypertrophy at L5-S1. No compression fracture or spondylolisthesis.    Patient Stated Goals  "I want to be able to do exercise or work w/o pain all the time."    Currently in Pain?  No/denies         [facility] PT Assessment - 03/18/19 0932      Assessment   Medical Diagnosis  Lumbosacral sprain    Referring Provider (PT)  Gregor Hams, MD    Next MD Visit  PRN      Observation/Other Assessments   Focus on Therapeutic Outcomes (FOTO)   Lumbar - 94% (6% limitation)      Strength   Right Hip Flexion  4+/5    Right Hip Extension  4/5    Right Hip External Rotation   4/5    Right Hip Internal Rotation  4+/5    Right Hip ABduction  4/5    Right Hip ADduction  4/5    Left Hip Flexion  4+/5    Left Hip Extension  4/5    Left Hip External Rotation  4/5    Left Hip Internal Rotation  4+/5    Left Hip ABduction  4/5    Left Hip ADduction  4/5    Right Knee Flexion  4+/5    Right Knee Extension  5/5    Left Knee Flexion  5/5    Left Knee Extension  5/5                   [facility] Adult PT Treatment/Exercise - 03/18/19 0932      Exercises   Exercises  Lumbar      Lumbar Exercises: Aerobic   Nustep  L5 x 6 min (UE/LE)   seat #8     Lumbar Exercises: Standing   Other Standing Lumbar Exercises  B pallof press with red TB 10 x 5" - pt reporting L shoulder discomfort      Lumbar Exercises: Seated   Other Seated Lumbar Exercises  Hip adduction pillow squeeze 5 x 5"  Lumbar Exercises: Supine   Bridge with Ball Squeeze  10 reps;5 seconds    Bridge with Ball Squeeze Limitations  pillow    Other Supine Lumbar Exercises  Abd bracing + Hip adduction pillow squeeze 10 x 5"             PT Education - 03/18/19 1009    Education Details  final HEP review - discontinued pallof press and added hip adduction bridge    Person(s) Educated  Patient    Methods  Explanation;Demonstration;Handout    Comprehension  Verbalized understanding;Returned demonstration       PT Short Term Goals - 02/18/19 0936      PT SHORT TERM GOAL #1   Title  Patient will be independent with initial HEP    Status  Achieved   02/11/19     PT SHORT TERM GOAL #2   Title  Patient will verbalize/demonstrate understanding of neutral spine posture and proper body mechanics to reduce strain on lumbar spine    Status  Achieved   02/11/19       PT Long Term Goals - 03/18/19 0947      PT LONG TERM GOAL #1   Title  Patient will be independent with ongoing/advanced HEP    Status  Achieved   03/18/19     PT LONG TERM GOAL #2   Title  Patient will verbalize/demonstrate good awareness of neutral spine posture and proper body mechanics for daily tasks    Status  Achieved   03/18/19     PT LONG  TERM GOAL #3   Title  Patient to improve lumbar AROM to WNL without pain provocation    Status  Achieved   03/04/19     PT LONG TERM GOAL #4   Title  Patient will demonstrate improved proximal strength to >/= 4/5 to 4+/5 for improved core stability    Status  Achieved   03/18/19     PT LONG TERM GOAL #5   Title  Patient to report ability to perform ADLs, household and work-related tasks without increased low back pain    Status  Achieved   03/18/19     PT LONG TERM GOAL #6   Title  Patient will be able to resume exercising/working out without limitation due to low back pain    Status  Achieved   03/18/19           Plan - 03/18/19 1007    Clinical Impression Statement  Mary [name] remains pain free today and feels ready to transition to HEP as planned. She does report some confusion with pallof press from latest HEP update and still did not feel comfortable with this exercise even after review therefore deferred from HEP. Strength testing revealing continued improvement with all proximal LE strength now 4/5 to 4+/5 with greatest weakness persisting in hip abd/adduction, extension & ER, therefore ensured HEP addressing these areas of weakness. Mary [name] reports good understanding of proper posture and body mechanics with daily tasks and reports she no longer feel limited with daily home or work tasks or working out due to LBP. All goals met for this episode, therefore will transition to HEP as planned but will place patient on 30-day hold for PT in the event that issues arise with HEP necessitating a return to PT.    Personal Factors and Comorbidities  Profession;Comorbidity 3+    Comorbidities  chronic LBP; HTN; recent WC PT for L foot & ankle sprain, L wrist sprain resulting from  a fall at work    Arts administrator Potential  Good    PT Frequency  --    PT Duration  --    PT Treatment/Interventions  ADLs/Self Care Home Management;Cryotherapy;Electrical  Stimulation;Iontophoresis 46m/ml Dexamethasone;Moist Heat;Traction;Ultrasound;Functional mobility training;Therapeutic activities;Therapeutic exercise;Neuromuscular re-education;Patient/family education;Manual techniques;Passive range of motion;Dry needling;Taping;Spinal Manipulations;Joint Manipulations    PT Next Visit Plan  transition to HEP + 30-day hold    PT Home Exercise Plan  03/14/19 (consolidated HEP) - SKTC, piriformis stretches, LTR, McKenzie prone press-up, green TB brace marching, green TB bridge + clam, low doorway pec stretch, red TB low rows, hip adduction bridge    Consulted and Agree with Plan of Care  Patient       Patient will benefit from skilled therapeutic intervention in order to improve the following deficits and impairments:  Decreased activity tolerance, Decreased knowledge of precautions, Decreased mobility, Decreased range of motion, Decreased strength, Increased fascial restricitons, Increased muscle spasms, Impaired perceived functional ability, Impaired flexibility, Impaired sensation, Improper body mechanics, Postural dysfunction, Pain  Visit Diagnosis: Chronic bilateral low back pain without sciatica  Abnormal posture  Muscle weakness (generalized)  Other symptoms and signs involving the musculoskeletal system     Problem List Patient Active Problem List   Diagnosis Date Noted  . Hypertension 07/30/2018  . Trigger finger, left middle finger 07/12/2018  . Musculoskeletal back pain 12/01/2017  . Common migraine 06/07/2017  . Chronic low back pain 12/21/2015  . Postmenopausal 05/15/2015  . Hyperlipidemia 11/17/2014  . 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 Obesity 11/17/2014  . Knee pain, right 04/09/2013  . Constipation 07/08/2009    JPercival Spanish PT, MPT 03/18/2019, 2:58 PM  [facility] [place] [address]Phone: [phone]  Fax:  [phone] Name: JGREYSON RICCARDIMRN: [id number] of Birth: [birth year]  PHYSICAL THERAPY DISCHARGE SUMMARY  Visits from Start of Care: 12  Current functional level related to goals / functional outcomes:   Refer to above clinical impression for status as of last visit on 03/18/2019. Patient was placed on hold for 30 days and has not needed to return to PT, therefore will proceed with discharge from PT for this episode.   Remaining deficits:   As above.   Education / Equipment:   HEP, PBiomedical scientisteducation  Plan: Patient agrees to discharge.  Patient goals were met. Patient is being discharged due to meeting the stated rehab goals.  ?????     JPercival Spanish PT, MPT 04/19/19, 10:54 AM  [facility] [place] [address]Phone: [phone]  Fax:  [phone]

## 2019-03-25 MED FILL — PROPRANOLOL 60 MG TABLET: 60 | 30 days supply | Qty: 60 | Fill #4

## 2019-04-03 DIAGNOSIS — Z1389 Encounter for screening for other disorder: Secondary | ICD-10-CM | POA: Diagnosis not present

## 2019-04-03 DIAGNOSIS — Z01419 Encounter for gynecological examination (general) (routine) without abnormal findings: Secondary | ICD-10-CM | POA: Diagnosis not present

## 2019-04-03 DIAGNOSIS — Z1231 Encounter for screening mammogram for malignant neoplasm of breast: Secondary | ICD-10-CM | POA: Diagnosis not present

## 2019-04-03 DIAGNOSIS — Z6838 Body mass index (BMI) 38.0-38.9, adult: Secondary | ICD-10-CM | POA: Diagnosis not present

## 2019-04-03 DIAGNOSIS — N951 Menopausal and female climacteric states: Secondary | ICD-10-CM | POA: Diagnosis not present

## 2019-04-03 DIAGNOSIS — Z130 Encounter for screening for diseases of the blood and blood-forming organs and certain disorders involving the immune mechanism: Secondary | ICD-10-CM | POA: Diagnosis not present

## 2019-05-28 ENCOUNTER — Other Ambulatory Visit: Payer: Self-pay | Admitting: Internal Medicine

## 2019-07-14 NOTE — Progress Notes (Signed)
Subjective:    Patient ID: Mary [name], female    DOB: 10-Mar-1965, 54 y.o.   MRN: [id number]  HPI She is here for a physical exam.   She denies any changes in her history.  She has no concerns.  Medications and allergies reviewed with patient and updated if appropriate.  Patient Active Problem List   Diagnosis Date Noted  . Hypertension 07/30/2018  . Trigger finger, left middle finger 07/12/2018  . Musculoskeletal back pain 12/01/2017  . Common migraine 06/07/2017  . Chronic low back pain 12/21/2015  . Postmenopausal 05/15/2015  . Hyperlipidemia 11/17/2014  . 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 Obesity 11/17/2014  . Knee pain, right 04/09/2013  . Constipation 07/08/2009    Current Outpatient Medications on File Prior to Visit  Medication Sig Dispense Refill  . Aspirin-Acetaminophen-Caffeine (EXCEDRIN MIGRAINE PO) Take by mouth as needed.    Marland Kitchen atorvastatin (LIPITOR) 10 MG tablet Take 1 tablet (10 mg total) by mouth daily. Annual appt due in June must see provider for future refills 90 tablet 0  . Calcium Carbonate-Vitamin D (CALCIUM 600+D3) 600-400 MG-UNIT per tablet Take 1 tablet by mouth daily.    . cyclobenzaprine (FLEXERIL) 10 MG tablet Take 1 tablet (10 mg total) by mouth at bedtime. 30 tablet 3  . famotidine (PEPCID) 20 MG tablet famotidine 20 mg tablet    . losartan (COZAAR) 25 MG tablet Take 1 tablet (25 mg total) by mouth daily. 30 tablet 5  . meloxicam (MOBIC) 15 MG tablet Take 1 tablet (15 mg total) by mouth daily as needed. 90 tablet 0  . Multiple Vitamin (MULTIVITAMIN) tablet Take 1 tablet by mouth daily.    . propranolol (INDERAL) 60 MG tablet Take 1 tablet (60 mg total) by mouth 2 (two) times daily. 60 tablet 5   No current facility-administered medications on file prior to visit.    Past Medical History:  Diagnosis Date  . Allergy   . Arthritis   . Headache(784.0)   . Hyperlipidemia     Past Surgical History:  Procedure Laterality Date  . CESAREAN  SECTION      Social History   Socioeconomic History  . Marital status: Widowed    Spouse name: [name]  . Number of children: 2  . Years of education: 22  . Highest education level: Not on file  Occupational History    Employer: [employer]  Tobacco Use  . Smoking status: Never Smoker  . Smokeless tobacco: Never Used  Vaping Use  . Vaping Use: Never used  Substance and Sexual Activity  . Alcohol use: No  . Drug use: No  . Sexual activity: Yes  Other Topics Concern  . Not on file  Social History Narrative   Patient is right handed, resides with husband([name]),two children in a home.      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 Difficulty of Paying Living Expenses:   Food Insecurity:   . Worried About Charity fundraiser in the Last Year:   . Arboriculturist in the Last Year:   Transportation Needs:   . Film/video editor (Medical):   Marland Kitchen Lack of Transportation (Non-Medical):   Physical Activity:   . Days of Exercise per Week:   . Minutes of Exercise per Session:   Stress:   . Feeling of Stress :   Social Connections:   . Frequency of Communication with  Friends and Family:   . Frequency of Social Gatherings with Friends and Family:   . Attends Religious Services:   . Active Member of Clubs or Organizations:   . Attends Archivist Meetings:   Marland Kitchen Marital Status:     Family History  Problem Relation Age of Onset  . Migraines Mother   . Colon cancer Neg Hx   . Esophageal cancer Neg Hx   . Rectal cancer Neg Hx   . Stomach cancer Neg Hx   . Colon polyps Neg Hx     Review of Systems  Constitutional: Negative for chills, fatigue and fever.  Eyes: Negative for visual disturbance.  Respiratory: Negative for cough, shortness of breath and wheezing.   Cardiovascular: Positive for palpitations (occ) and leg swelling (occ after being on feet all day). Negative for chest pain.  Gastrointestinal: Positive  for constipation (controlled with stool softener). Negative for abdominal pain, blood in stool, diarrhea and nausea.       No gerd  Genitourinary: Negative for dysuria and hematuria.  Musculoskeletal: Positive for arthralgias (occ knee pain, left finger locks) and back pain.  Skin: Negative for color change and rash.  Neurological: Negative for dizziness, light-headedness and headaches.  Psychiatric/Behavioral: Negative for dysphoric mood and sleep disturbance. The patient is nervous/anxious (sometimes).        Objective:   Vitals:   07/15/19 0903  BP: 128/84  Pulse: 68  Temp: 98 F (36.7 C)  SpO2: 98%   Filed Weights   07/15/19 0903  Weight: 196 lb (88.9 kg)   Body mass index is 31.64 kg/m.  BP Readings from Last 3 Encounters:  07/15/19 128/84  03/06/19 128/84  01/30/19 (!) 146/92    Wt Readings from Last 3 Encounters:  07/15/19 196 lb (88.9 kg)  03/06/19 195 lb 12.8 oz (88.8 kg)  01/30/19 196 lb (88.9 kg)     Physical Exam Constitutional: She appears well-developed and well-nourished. No distress.  HENT:  Head: Normocephalic and atraumatic.  Right Ear: External ear normal. Normal ear canal and TM Left Ear: External ear normal.  Normal ear canal and TM Mouth/Throat: Oropharynx is clear and moist.  Eyes: Conjunctivae and EOM are normal.  Neck: Neck supple. No tracheal deviation present. thyromegaly present.  No carotid bruit  Cardiovascular: Normal rate, regular rhythm and normal heart sounds.   No murmur heard.  No edema. Pulmonary/Chest: Effort normal and breath sounds normal. No respiratory distress. She has no wheezes. She has no rales.  Breast: deferred   Abdominal: Soft. She exhibits no distension. There is no tenderness.  Lymphadenopathy: She has no cervical adenopathy.  Skin: Skin is warm and dry. She is not diaphoretic.  Psychiatric: She has a normal mood and affect. Her behavior is normal.        Assessment & Plan:   Physical exam: Screening  blood work    ordered Immunizations had Covid, discussed shingrix Colonoscopy  Up to date  Mammogram  Up to date  Gyn  Up to date  Eye exams  Up to date  Exercise  Very active at work - [employer]  Encouraged weight Substance abuse   none  See Problem List for Assessment and Plan of chronic medical problems.   This visit occurred during the SARS-CoV-2 public health emergency.  Safety protocols were in place, including screening questions prior to the visit, additional usage of staff PPE, and extensive cleaning of exam room while observing appropriate contact time as indicated for disinfecting solutions.

## 2019-07-14 NOTE — Patient Instructions (Addendum)
Blood work was ordered.    All other Health Maintenance issues reviewed.   All recommended immunizations and age-appropriate screenings are up-to-date or discussed.  No immunization administered today.   Medications reviewed and updated.  Changes include :   none  Your prescription(s) have been submitted to your pharmacy. Please take as directed and contact our office if you believe you are having problem(s) with the medication(s).    Please followup in 6 months    Health Maintenance, Female Adopting a healthy lifestyle and getting preventive care are important in promoting health and wellness. Ask your health care provider about:  The right schedule for you to have regular tests and exams.  Things you can do on your own to prevent diseases and keep yourself healthy. What should I know about diet, weight, and exercise? Eat a healthy diet   Eat a diet that includes plenty of vegetables, fruits, low-fat dairy products, and lean protein.  Do not eat a lot of foods that are high in solid fats, added sugars, or sodium. Maintain a healthy weight Body mass index (BMI) is used to identify weight problems. It estimates body fat based on height and weight. Your health care provider can help determine your BMI and help you achieve or maintain a healthy weight. Get regular exercise Get regular exercise. This is one of the most important things you can do for your health. Most adults should:  Exercise for at least 150 minutes each week. The exercise should increase your heart rate and make you sweat (moderate-intensity exercise).  Do strengthening exercises at least twice a week. This is in addition to the moderate-intensity exercise.  Spend less time sitting. Even light physical activity can be beneficial. Watch cholesterol and blood lipids Have your blood tested for lipids and cholesterol at 52 years of age, then have this test every 5 years. Have your cholesterol levels checked more  often if:  Your lipid or cholesterol levels are high.  You are older than 52 years of age.  You are at high risk for heart disease. What should I know about cancer screening? Depending on your health history and family history, you may need to have cancer screening at various ages. This may include screening for:  Breast cancer.  Cervical cancer.  Colorectal cancer.  Skin cancer.  Lung cancer. What should I know about heart disease, diabetes, and high blood pressure? Blood pressure and heart disease  High blood pressure causes heart disease and increases the risk of stroke. This is more likely to develop in people who have high blood pressure readings, are of African descent, or are overweight.  Have your blood pressure checked: ? Every 3-5 years if you are 18-39 years of age. ? Every year if you are 40 years old or older. Diabetes Have regular diabetes screenings. This checks your fasting blood sugar level. Have the screening done:  Once every three years after age 40 if you are at a normal weight and have a low risk for diabetes.  More often and at a younger age if you are overweight or have a high risk for diabetes. What should I know about preventing infection? Hepatitis B If you have a higher risk for hepatitis B, you should be screened for this virus. Talk with your health care provider to find out if you are at risk for hepatitis B infection. Hepatitis C Testing is recommended for:  Everyone born from 1945 through 1965.  Anyone with known risk factors for hepatitis   C. Sexually transmitted infections (STIs)  Get screened for STIs, including gonorrhea and chlamydia, if: ? You are sexually active and are younger than 52 years of age. ? You are older than 52 years of age and your health care provider tells you that you are at risk for this type of infection. ? Your sexual activity has changed since you were last screened, and you are at increased risk for chlamydia  or gonorrhea. Ask your health care provider if you are at risk.  Ask your health care provider about whether you are at high risk for HIV. Your health care provider may recommend a prescription medicine to help prevent HIV infection. If you choose to take medicine to prevent HIV, you should first get tested for HIV. You should then be tested every 3 months for as long as you are taking the medicine. Pregnancy  If you are about to stop having your period (premenopausal) and you may become pregnant, seek counseling before you get pregnant.  Take 400 to 800 micrograms (mcg) of folic acid every day if you become pregnant.  Ask for birth control (contraception) if you want to prevent pregnancy. Osteoporosis and menopause Osteoporosis is a disease in which the bones lose minerals and strength with aging. This can result in bone fractures. If you are 65 years old or older, or if you are at risk for osteoporosis and fractures, ask your health care provider if you should:  Be screened for bone loss.  Take a calcium or vitamin D supplement to lower your risk of fractures.  Be given hormone replacement therapy (HRT) to treat symptoms of menopause. Follow these instructions at home: Lifestyle  Do not use any products that contain nicotine or tobacco, such as cigarettes, e-cigarettes, and chewing tobacco. If you need help quitting, ask your health care provider.  Do not use street drugs.  Do not share needles.  Ask your health care provider for help if you need support or information about quitting drugs. Alcohol use  Do not drink alcohol if: ? Your health care provider tells you not to drink. ? You are pregnant, may be pregnant, or are planning to become pregnant.  If you drink alcohol: ? Limit how much you use to 0-1 drink a day. ? Limit intake if you are breastfeeding.  Be aware of how much alcohol is in your drink. In the [place] one drink equals one 12 oz bottle of beer (355 mL), one 5 oz  glass of wine (148 mL), or one 1 oz glass of hard liquor (44 mL). General instructions  Schedule regular health, dental, and eye exams.  Stay current with your vaccines.  Tell your health care provider if: ? You often feel depressed. ? You have ever been abused or do not feel safe at home. Summary  Adopting a healthy lifestyle and getting preventive care are important in promoting health and wellness.  Follow your health care provider's instructions about healthy diet, exercising, and getting tested or screened for diseases.  Follow your health care provider's instructions on monitoring your cholesterol and blood pressure. This information is not intended to replace advice given to you by your health care provider. Make sure you discuss any questions you have with your health care provider. Document Revised: 12/27/2017 Document Reviewed: 12/27/2017 Elsevier Patient Education  2020 Elsevier Inc.  

## 2019-07-15 ENCOUNTER — Other Ambulatory Visit: Payer: Self-pay

## 2019-07-15 ENCOUNTER — Other Ambulatory Visit: Payer: Self-pay | Admitting: Internal Medicine

## 2019-07-15 ENCOUNTER — Ambulatory Visit (INDEPENDENT_AMBULATORY_CARE_PROVIDER_SITE_OTHER): Payer: 59 | Admitting: Internal Medicine

## 2019-07-15 ENCOUNTER — Encounter: Payer: Self-pay | Admitting: Internal Medicine

## 2019-07-15 VITALS — BP 128/84 | HR 68 | Temp 98.0°F | Ht 66.0 in | Wt 196.0 lb

## 2019-07-15 DIAGNOSIS — E782 Mixed hyperlipidemia: Secondary | ICD-10-CM

## 2019-07-15 DIAGNOSIS — G43009 Migraine without aura, not intractable, without status migrainosus: Secondary | ICD-10-CM

## 2019-07-15 DIAGNOSIS — E6609 Other obesity due to excess calories: Secondary | ICD-10-CM | POA: Diagnosis not present

## 2019-07-15 DIAGNOSIS — K5900 Constipation, unspecified: Secondary | ICD-10-CM

## 2019-07-15 DIAGNOSIS — Z0000 Encounter for general adult medical examination without abnormal findings: Secondary | ICD-10-CM

## 2019-07-15 DIAGNOSIS — G8929 Other chronic pain: Secondary | ICD-10-CM | POA: Diagnosis not present

## 2019-07-15 DIAGNOSIS — M545 Low back pain, unspecified: Secondary | ICD-10-CM

## 2019-07-15 DIAGNOSIS — Z6831 Body mass index (BMI) 31.0-31.9, adult: Secondary | ICD-10-CM

## 2019-07-15 DIAGNOSIS — E010 Iodine-deficiency related diffuse (endemic) goiter: Secondary | ICD-10-CM

## 2019-07-15 DIAGNOSIS — I10 Essential (primary) hypertension: Secondary | ICD-10-CM

## 2019-07-15 LAB — CBC WITH DIFFERENTIAL/PLATELET
Basophils Absolute: 0 10*3/uL (ref 0.0–0.1)
Basophils Relative: 0.5 % (ref 0.0–3.0)
Eosinophils Absolute: 0.1 10*3/uL (ref 0.0–0.7)
Eosinophils Relative: 1.8 % (ref 0.0–5.0)
HCT: 37.5 % (ref 36.0–46.0)
Hemoglobin: 12.6 g/dL (ref 12.0–15.0)
Lymphocytes Relative: 35.9 % (ref 12.0–46.0)
Lymphs Abs: 1.7 10*3/uL (ref 0.7–4.0)
MCHC: 33.6 g/dL (ref 30.0–36.0)
MCV: 93.3 fl (ref 78.0–100.0)
Monocytes Absolute: 0.4 10*3/uL (ref 0.1–1.0)
Monocytes Relative: 8.4 % (ref 3.0–12.0)
Neutro Abs: 2.6 10*3/uL (ref 1.4–7.7)
Neutrophils Relative %: 53.4 % (ref 43.0–77.0)
Platelets: 163 10*3/uL (ref 150.0–400.0)
RBC: 4.02 Mil/uL (ref 3.87–5.11)
RDW: 13.6 % (ref 11.5–15.5)
WBC: 4.8 10*3/uL (ref 4.0–10.5)

## 2019-07-15 LAB — LIPID PANEL
Cholesterol: 167 mg/dL (ref 0–200)
HDL: 68 mg/dL (ref >39.00)
LDL Cholesterol: 88 mg/dL (ref 0–99)
NonHDL: 99.09
Total CHOL/HDL Ratio: 2
Triglycerides: 53 mg/dL (ref 0.0–149.0)
VLDL: 10.6 mg/dL (ref 0.0–40.0)

## 2019-07-15 LAB — COMPREHENSIVE METABOLIC PANEL
ALT: 18 U/L (ref 0–35)
AST: 19 U/L (ref 0–37)
Albumin: 4.1 g/dL (ref 3.5–5.2)
Alkaline Phosphatase: 78 U/L (ref 39–117)
BUN: 15 mg/dL (ref 6–23)
CO2: 29 mEq/L (ref 19–32)
Calcium: 9.6 mg/dL (ref 8.4–10.5)
Chloride: 103 mEq/L (ref 96–112)
Creatinine, Ser: 0.8 mg/dL (ref 0.40–1.20)
GFR: 91.14 mL/min (ref >60.00)
Glucose, Bld: 91 mg/dL (ref 70–99)
Potassium: 3.8 mEq/L (ref 3.5–5.1)
Sodium: 139 mEq/L (ref 135–145)
Total Bilirubin: 1 mg/dL (ref 0.2–1.2)
Total Protein: 7.7 g/dL (ref 6.0–8.3)

## 2019-07-15 LAB — TSH: TSH: 0.52 u[IU]/mL (ref 0.35–4.50)

## 2019-07-15 MED ORDER — LOSARTAN POTASSIUM 25 MG PO TABS: 25.0000 mg | ORAL_TABLET | Freq: Every day | ORAL | 1 refills | Status: DC

## 2019-07-15 MED ORDER — ATORVASTATIN CALCIUM 10 MG PO TABS: 10.0000 mg | ORAL_TABLET | Freq: Every day | ORAL | 1 refills | Status: DC

## 2019-07-15 MED ORDER — CYCLOBENZAPRINE HCL 10 MG PO TABS: 10.0000 mg | ORAL_TABLET | Freq: Every day | ORAL | 5 refills | Status: DC

## 2019-07-15 MED ORDER — PROPRANOLOL HCL 60 MG PO TABS: 60.0000 mg | ORAL_TABLET | Freq: Two times a day (BID) | ORAL | 1 refills | Status: DC

## 2019-07-15 MED ORDER — MELOXICAM 15 MG PO TABS: 15.0000 mg | ORAL_TABLET | Freq: Every day | ORAL | 1 refills | Status: DC | PRN

## 2019-07-15 MED FILL — LOSARTAN POTASSIUM 25 MG TA: 25 | 90 days supply | Qty: 90 | Fill #0

## 2019-07-15 MED FILL — MELOXICAM 15 MG TABLET: 15 | 90 days supply | Qty: 90 | Fill #0

## 2019-07-15 MED FILL — PROPRANOLOL 60 MG TABLET: 60 | 90 days supply | Qty: 180 | Fill #0

## 2019-07-15 MED FILL — CYCLOBENZAPRINE HCL 10 MG T: 10 | 30 days supply | Qty: 30 | Fill #0

## 2019-07-15 NOTE — Assessment & Plan Note (Addendum)
Chronic No recent migraines On propranolol   Controlled continue propranolol

## 2019-07-15 NOTE — Assessment & Plan Note (Signed)
Chronic lower back pain - intermittent flare, but daily pain Taking mobic daily Flexeril prn Has done PT - doing exercises daily Continue above

## 2019-07-15 NOTE — Assessment & Plan Note (Signed)
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

## 2019-07-15 NOTE — Assessment & Plan Note (Signed)
Chronic Stable TSH 

## 2019-07-15 NOTE — Assessment & Plan Note (Signed)
Chronic BP well controlled Current regimen effective and well tolerated Continue current medications at current doses cmp  

## 2019-07-15 NOTE — Assessment & Plan Note (Signed)
Chronic Controlled with stool softener

## 2019-07-15 NOTE — Assessment & Plan Note (Signed)
Chronic Check lipid panel  Continue daily statin Regular exercise and healthy diet encouraged  

## 2019-07-17 ENCOUNTER — Encounter: Payer: Self-pay | Admitting: Internal Medicine

## 2019-07-19 ENCOUNTER — Other Ambulatory Visit: Payer: Self-pay | Admitting: Internal Medicine

## 2019-07-19 MED ORDER — CYCLOBENZAPRINE HCL 10 MG PO TABS: 10.0000 mg | ORAL_TABLET | Freq: Every day | ORAL | 1 refills | Status: DC

## 2019-07-19 MED ORDER — ATORVASTATIN CALCIUM 10 MG PO TABS: 10.0000 mg | ORAL_TABLET | Freq: Every day | ORAL | 1 refills | Status: DC

## 2019-09-04 MED FILL — CYCLOBENZAPRINE HCL 10 MG T: 10 | 90 days supply | Qty: 90 | Fill #0

## 2019-09-04 MED FILL — ATORVASTATIN CALCIUM 10 MG: 10 | 90 days supply | Qty: 90 | Fill #0

## 2019-10-24 IMAGING — DX LEFT WRIST - COMPLETE 3+ VIEW
4 series · 4 of 4 positions shown · non-contrast
Comparison: None.

CLINICAL DATA: Pain following recent fall

EXAM:
LEFT WRIST - COMPLETE 3+ VIEW

[wrist pa]
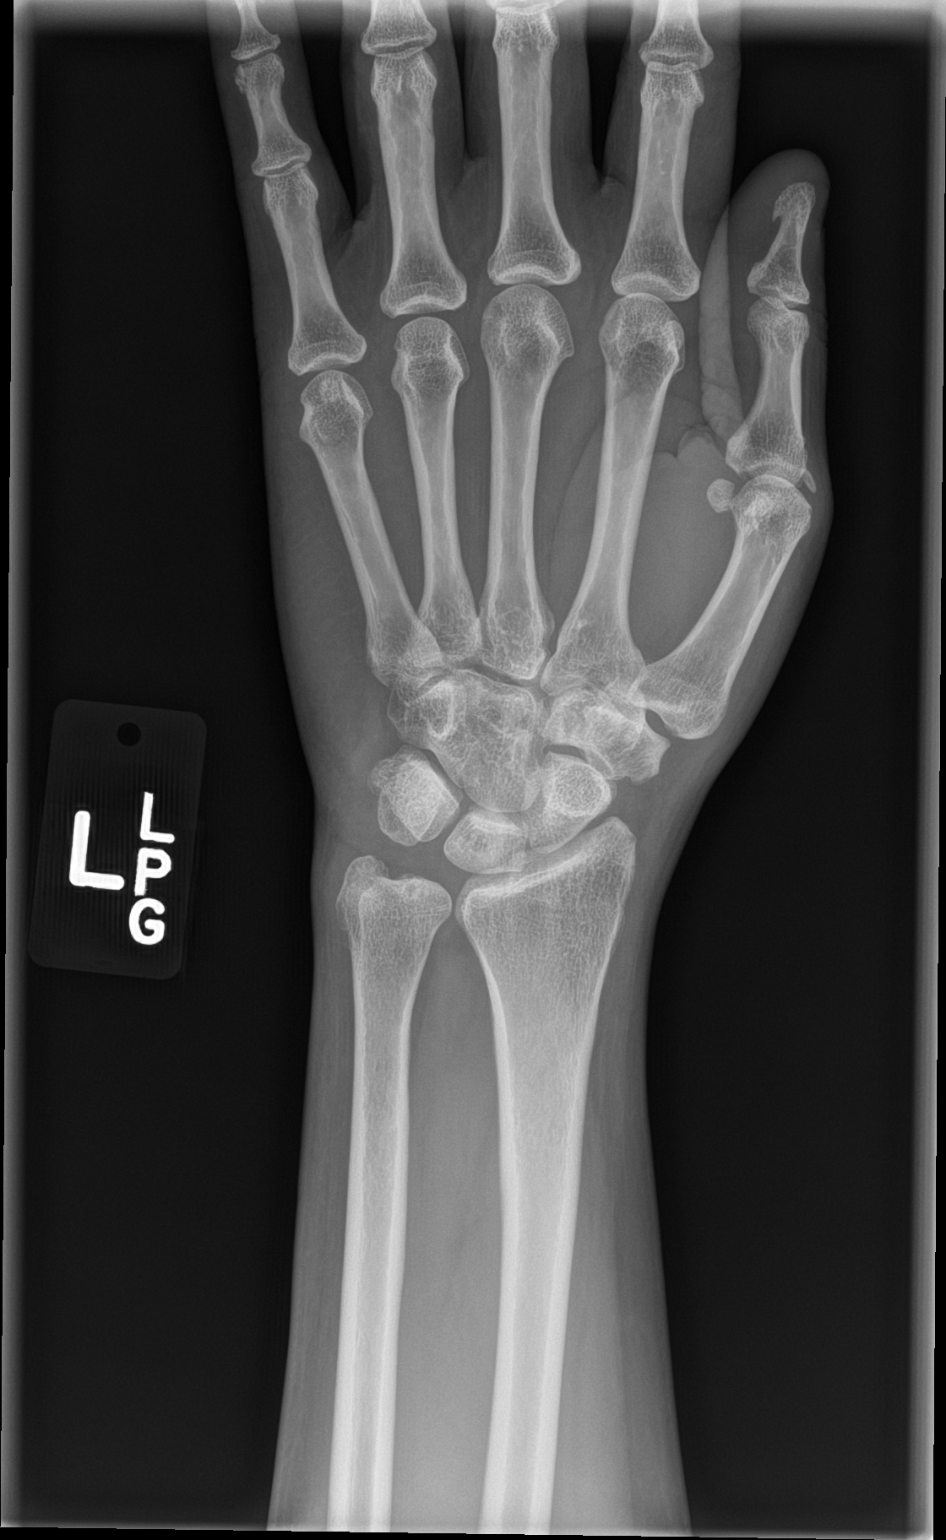

[wrist obl]
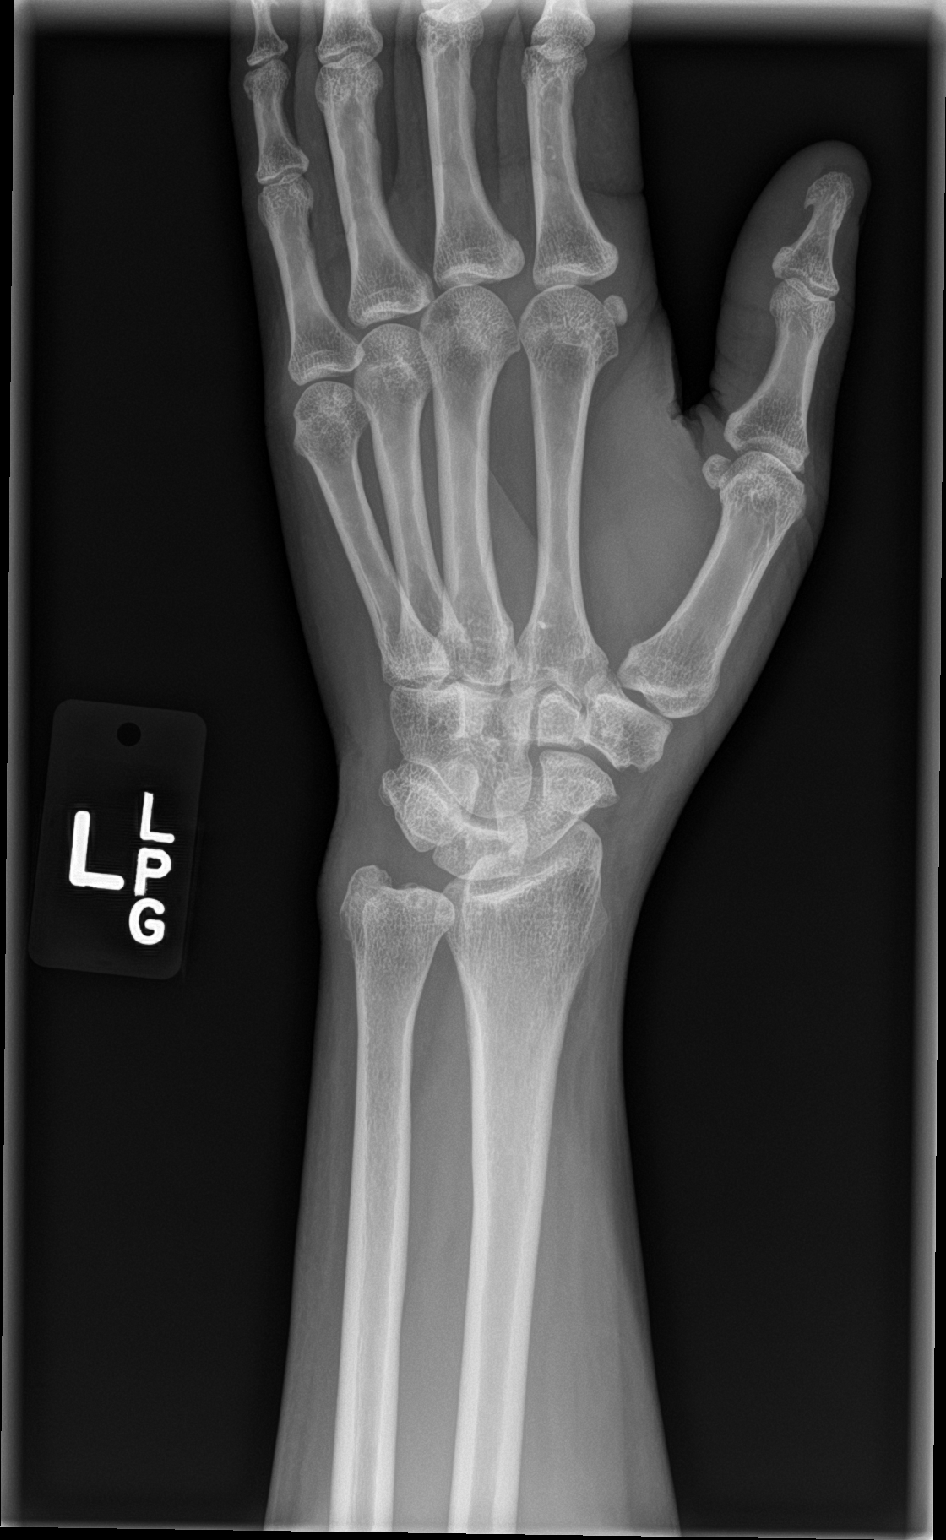

[wrist lat]
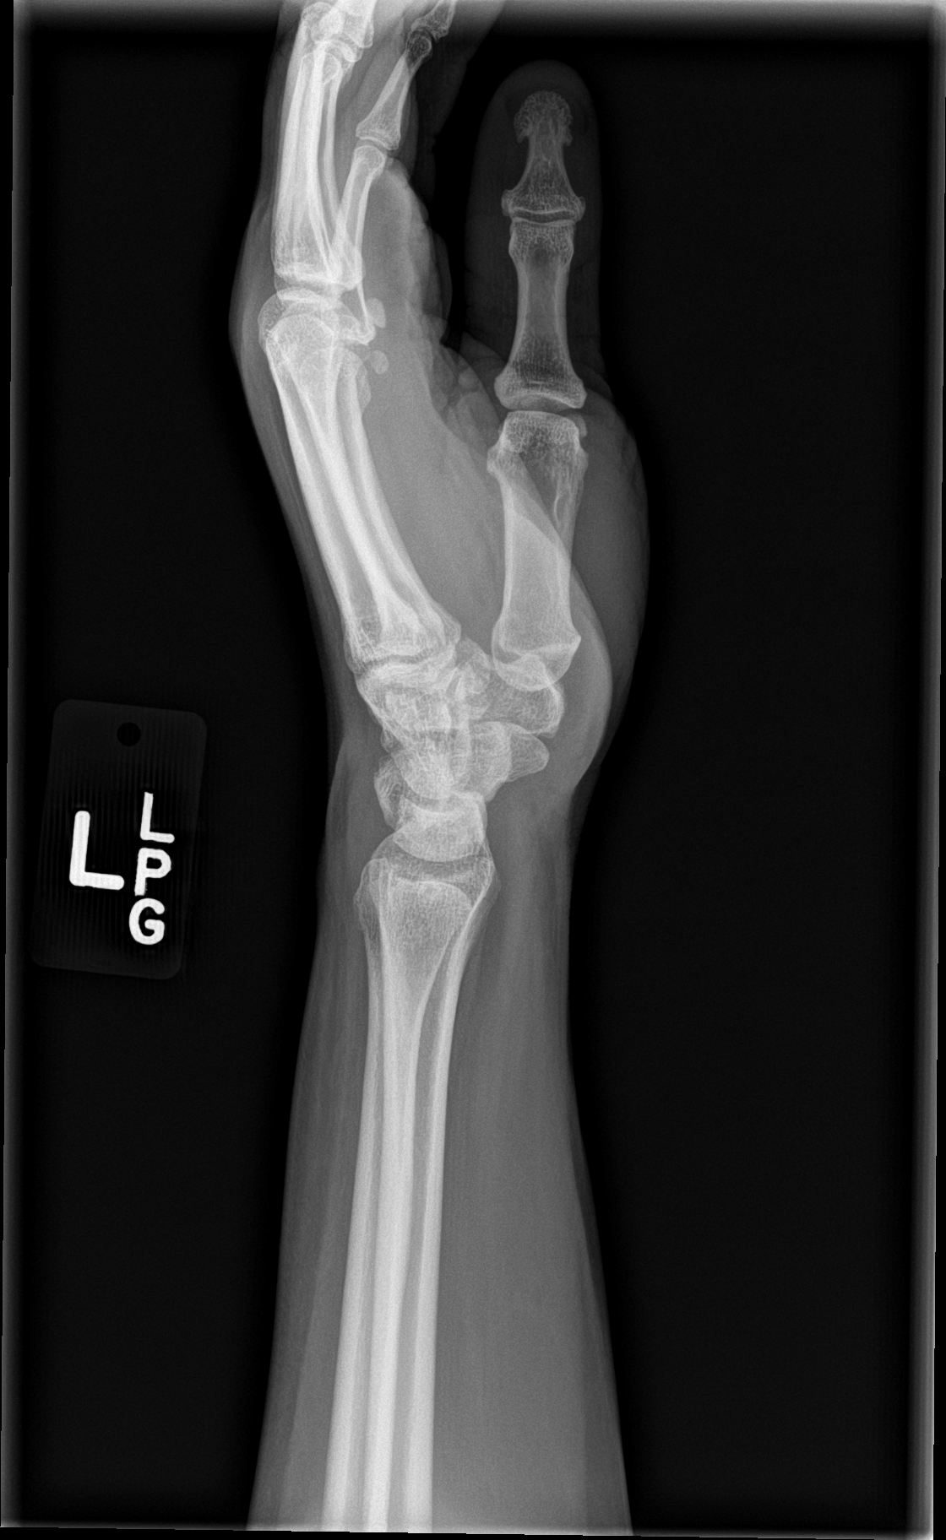

[wrist navicular]
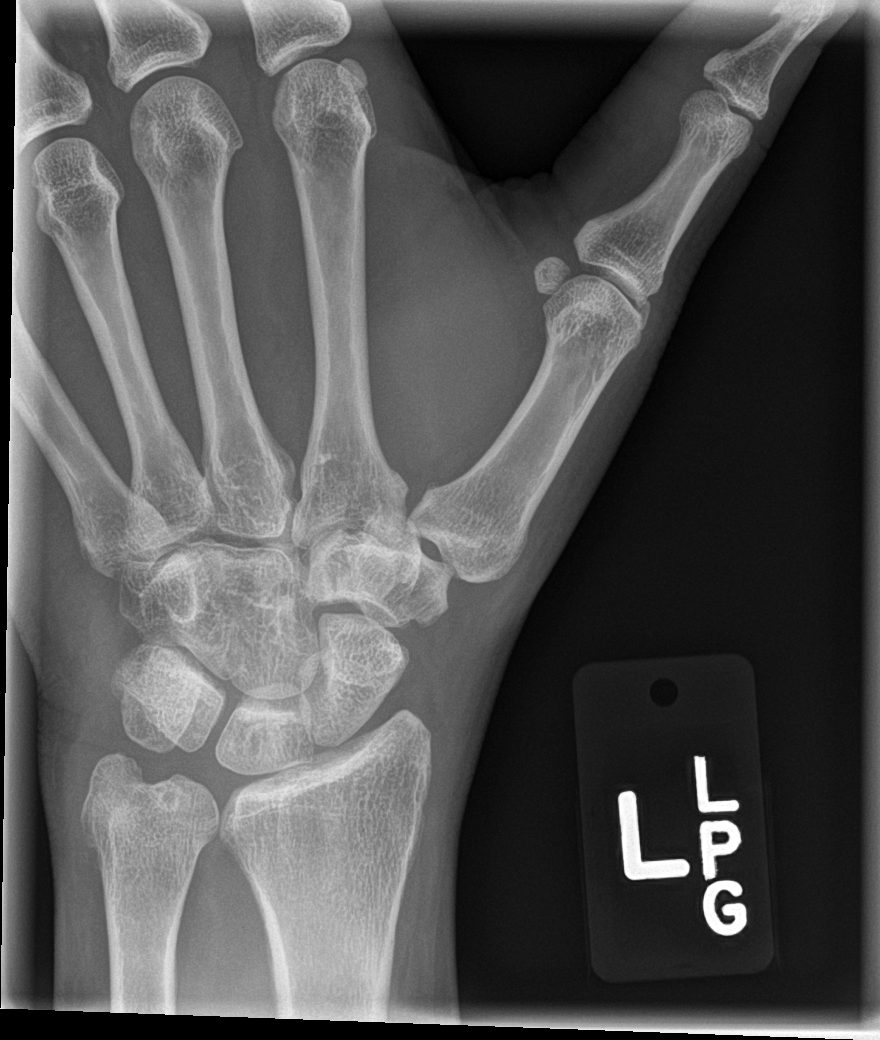

[4 of 4 positions shown; findings below may reference images not displayed]

FINDINGS: Frontal, oblique, lateral, and ulnar deviation scaphoid images were
obtained. There is no acute fracture or dislocation. Joint spaces
appear unremarkable. No erosive change. Benign cystic change noted
in the subchondral region distal ulna.
IMPRESSION: No fracture or dislocation. No appreciable joint space narrowing or
erosion.

## 2019-11-06 MED FILL — LOSARTAN POTASSIUM 25 MG TA: 25 | 90 days supply | Qty: 90 | Fill #1

## 2019-12-03 ENCOUNTER — Other Ambulatory Visit: Payer: Self-pay | Admitting: Internal Medicine

## 2019-12-03 MED FILL — CYCLOBENZAPRINE HCL 10 MG T: 10 | 90 days supply | Qty: 90 | Fill #1

## 2019-12-03 MED FILL — ATORVASTATIN CALCIUM 10 MG: 10 | 90 days supply | Qty: 90 | Fill #1

## 2019-12-04 MED FILL — MELOXICAM 15 MG TABLET: 15 | 90 days supply | Qty: 90 | Fill #1

## 2020-01-17 NOTE — Progress Notes (Signed)
Subjective:    Patient ID: Mary [name], female    DOB: 04-05-1965, 54 y.o.   MRN: [id number]  HPI The patient is here for follow up of their chronic medical problems, including htn, migraines, hyperlipidemia, back pain - MSk  She does her back stretches regularly.  They have helped her chronic back pain.  She takes the mobic daily.   She has occasional GERD.  It is once in a while.  She drinks water and it goes away.     Medications and allergies reviewed with patient and updated if appropriate.  Patient Active Problem List   Diagnosis Date Noted  . Hypertension 07/30/2018  . Trigger finger, left middle finger 07/12/2018  . Common migraine 06/07/2017  . Chronic low back pain 12/21/2015  . Postmenopausal 05/15/2015  . Hyperlipidemia 11/17/2014  . 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 Obesity 11/17/2014  . Knee pain, right 04/09/2013  . Constipation 07/08/2009    Current Outpatient Medications on File Prior to Visit  Medication Sig Dispense Refill  . Aspirin-Acetaminophen-Caffeine (EXCEDRIN MIGRAINE PO) Take by mouth as needed.    Marland Kitchen atorvastatin (LIPITOR) 10 MG tablet Take 1 tablet (10 mg total) by mouth daily. 90 tablet 1  . Calcium Carbonate-Vitamin D 600-400 MG-UNIT tablet Take 1 tablet by mouth daily.    . cyclobenzaprine (FLEXERIL) 10 MG tablet Take 1 tablet (10 mg total) by mouth at bedtime. 90 tablet 1  . losartan (COZAAR) 25 MG tablet Take 1 tablet (25 mg total) by mouth daily. 90 tablet 1  . meloxicam (MOBIC) 15 MG tablet TAKE 1 TABLET BY MOUTH ONCE DAILY AS NEEDED 90 tablet 0  . Multiple Vitamin (MULTIVITAMIN) tablet Take 1 tablet by mouth daily.    . propranolol (INDERAL) 60 MG tablet Take 1 tablet (60 mg total) by mouth 2 (two) times daily. 180 tablet 1   No current facility-administered medications on file prior to visit.    Past Medical History:  Diagnosis Date  . Allergy   . Arthritis   . Headache(784.0)   . Hyperlipidemia     Past Surgical History:   Procedure Laterality Date  . CESAREAN SECTION      Social History   Socioeconomic History  . Marital status: Widowed    Spouse name: Engineer, production  . Number of children: 2  . Years of education: 14  . Highest education level: Not on file  Occupational History    Employer: [employer]  Tobacco Use  . Smoking status: Never Smoker  . Smokeless tobacco: Never Used  Vaping Use  . Vaping Use: Never used  Substance and Sexual Activity  . Alcohol use: No  . Drug use: No  . Sexual activity: Yes  Other Topics Concern  . Not on file  Social History Narrative   Patient is right handed, resides with husband([name]),two children in a home.      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 Migraines Mother   . Colon cancer Neg Hx   . Esophageal cancer Neg Hx   . Rectal cancer Neg Hx   . Stomach cancer Neg Hx   . Colon polyps Neg Hx  Review of Systems  Constitutional: Negative for chills and fever.  Respiratory: Negative for cough, shortness of breath and wheezing.   Cardiovascular: Negative for chest pain, palpitations and leg swelling.  Neurological: Positive for headaches (migraines - occasional). Negative for light-headedness.       Objective:   Vitals:   01/20/20 0853  BP: 134/70  Pulse: 62  Temp: 98.2 F (36.8 C)  SpO2: 99%   BP Readings from Last 3 Encounters:  01/20/20 134/70  07/15/19 128/84  03/06/19 128/84   Wt Readings from Last 3 Encounters:  01/20/20 194 lb (88 kg)  07/15/19 196 lb (88.9 kg)  03/06/19 195 lb 12.8 oz (88.8 kg)   Body mass index is 31.31 kg/m.   Physical Exam    Constitutional: Appears well-developed and well-nourished. No distress.  HENT:  Head: Normocephalic and  atraumatic.  Neck: Neck supple. No tracheal deviation present. Mild thyromegaly present.  No cervical lymphadenopathy Cardiovascular: Normal rate, regular rhythm and normal heart sounds.   No murmur heard. No carotid bruit .  No edema Pulmonary/Chest: Effort normal and breath sounds normal. No respiratory distress. No has no wheezes. No rales.  Skin: Skin is warm and dry. Not diaphoretic.  Psychiatric: Normal mood and affect. Behavior is normal.      Assessment & Plan:    See Problem List for Assessment and Plan of chronic medical problems.    This visit occurred during the SARS-CoV-2 public health emergency.  Safety protocols were in place, including screening questions prior to the visit, additional usage of staff PPE, and extensive cleaning of exam room while observing appropriate contact time as indicated for disinfecting solutions.

## 2020-01-17 NOTE — Patient Instructions (Addendum)
    Blood work was ordered.      Medications changes include :   none     Please followup in 6 months  

## 2020-01-20 ENCOUNTER — Other Ambulatory Visit: Payer: Self-pay | Admitting: Internal Medicine

## 2020-01-20 ENCOUNTER — Ambulatory Visit (INDEPENDENT_AMBULATORY_CARE_PROVIDER_SITE_OTHER): Payer: 59 | Admitting: Internal Medicine

## 2020-01-20 ENCOUNTER — Encounter: Payer: Self-pay | Admitting: Internal Medicine

## 2020-01-20 ENCOUNTER — Other Ambulatory Visit: Payer: Self-pay

## 2020-01-20 VITALS — BP 134/70 | HR 62 | Temp 98.2°F | Ht 66.0 in | Wt 194.0 lb

## 2020-01-20 DIAGNOSIS — G43009 Migraine without aura, not intractable, without status migrainosus: Secondary | ICD-10-CM

## 2020-01-20 DIAGNOSIS — M545 Low back pain, unspecified: Secondary | ICD-10-CM

## 2020-01-20 DIAGNOSIS — Z1159 Encounter for screening for other viral diseases: Secondary | ICD-10-CM | POA: Diagnosis not present

## 2020-01-20 DIAGNOSIS — E782 Mixed hyperlipidemia: Secondary | ICD-10-CM

## 2020-01-20 DIAGNOSIS — I10 Essential (primary) hypertension: Secondary | ICD-10-CM

## 2020-01-20 DIAGNOSIS — G8929 Other chronic pain: Secondary | ICD-10-CM | POA: Diagnosis not present

## 2020-01-20 LAB — COMPREHENSIVE METABOLIC PANEL
ALT: 15 U/L (ref 0–35)
AST: 18 U/L (ref 0–37)
Albumin: 4.2 g/dL (ref 3.5–5.2)
Alkaline Phosphatase: 71 U/L (ref 39–117)
BUN: 17 mg/dL (ref 6–23)
CO2: 31 mEq/L (ref 19–32)
Calcium: 9.5 mg/dL (ref 8.4–10.5)
Chloride: 103 mEq/L (ref 96–112)
Creatinine, Ser: 0.82 mg/dL (ref 0.40–1.20)
GFR: 82.18 mL/min (ref >60.00)
Glucose, Bld: 94 mg/dL (ref 70–99)
Potassium: 4 mEq/L (ref 3.5–5.1)
Sodium: 139 mEq/L (ref 135–145)
Total Bilirubin: 1 mg/dL (ref 0.2–1.2)
Total Protein: 7.8 g/dL (ref 6.0–8.3)

## 2020-01-20 MED ORDER — LOSARTAN POTASSIUM 25 MG PO TABS: 25.0000 mg | ORAL_TABLET | Freq: Every day | ORAL | 1 refills | Status: DC

## 2020-01-20 MED ORDER — MELOXICAM 15 MG PO TABS: 15.0000 mg | ORAL_TABLET | Freq: Every day | ORAL | 1 refills | Status: DC | PRN

## 2020-01-20 MED ORDER — ATORVASTATIN CALCIUM 10 MG PO TABS: 10.0000 mg | ORAL_TABLET | Freq: Every day | ORAL | 1 refills | Status: DC

## 2020-01-20 MED ORDER — PROPRANOLOL HCL 60 MG PO TABS: 60.0000 mg | ORAL_TABLET | Freq: Two times a day (BID) | ORAL | 1 refills | Status: DC

## 2020-01-20 MED FILL — PROPRANOLOL 60 MG TABLET: 60 | 90 days supply | Qty: 180 | Fill #0

## 2020-01-20 MED FILL — LOSARTAN POTASSIUM 25 MG TA: 25 | 30 days supply | Qty: 30 | Fill #0

## 2020-01-20 NOTE — Assessment & Plan Note (Signed)
Chronic Controlled  Doing stretching regularly, which has helped Continue mobic 15 mg daily Continue flexeril 10 mg daily prn

## 2020-01-20 NOTE — Assessment & Plan Note (Signed)
Chronic controlled Continue atorvastatin 10 mg daily Regular exercise and healthy diet encouraged

## 2020-01-20 NOTE — Assessment & Plan Note (Signed)
Chronic controlled Just occasional migraines - often if she goes too long w/o eating On propranolol 60 mg BID - continue excedrin migraine prn

## 2020-01-20 NOTE — Assessment & Plan Note (Signed)
Chronic BP well controlled Continue losartan 25 mg daily cmp  

## 2020-01-21 LAB — HEPATITIS C ANTIBODY
Hepatitis C Ab: NONREACTIVE
SIGNAL TO CUT-OFF: 0.02 (ref <1.00)

## 2020-02-26 MED FILL — MELOXICAM 15 MG TABLET: 15 | 90 days supply | Qty: 90 | Fill #0

## 2020-02-26 MED FILL — LOSARTAN POTASSIUM 25 MG TA: 25 | 30 days supply | Qty: 30 | Fill #1

## 2020-02-26 MED FILL — CYCLOBENZAPRINE HCL 10 MG T: 10 | 30 days supply | Qty: 30 | Fill #1

## 2020-02-26 MED FILL — ATORVASTATIN CALCIUM 10 MG: 10 | 90 days supply | Qty: 90 | Fill #0

## 2020-04-10 MED FILL — LOSARTAN POTASSIUM 25 MG TA: 25 | 90 days supply | Qty: 90 | Fill #2

## 2020-04-21 ENCOUNTER — Encounter: Payer: Self-pay | Admitting: Internal Medicine

## 2020-04-21 DIAGNOSIS — Z6838 Body mass index (BMI) 38.0-38.9, adult: Secondary | ICD-10-CM | POA: Diagnosis not present

## 2020-04-21 DIAGNOSIS — Z124 Encounter for screening for malignant neoplasm of cervix: Secondary | ICD-10-CM | POA: Diagnosis not present

## 2020-04-21 DIAGNOSIS — Z01419 Encounter for gynecological examination (general) (routine) without abnormal findings: Secondary | ICD-10-CM | POA: Diagnosis not present

## 2020-04-21 DIAGNOSIS — Z1231 Encounter for screening mammogram for malignant neoplasm of breast: Secondary | ICD-10-CM | POA: Diagnosis not present

## 2020-04-21 DIAGNOSIS — Z130 Encounter for screening for diseases of the blood and blood-forming organs and certain disorders involving the immune mechanism: Secondary | ICD-10-CM | POA: Diagnosis not present

## 2020-04-21 DIAGNOSIS — Z1389 Encounter for screening for other disorder: Secondary | ICD-10-CM | POA: Diagnosis not present

## 2020-04-21 DIAGNOSIS — Z1151 Encounter for screening for human papillomavirus (HPV): Secondary | ICD-10-CM | POA: Diagnosis not present

## 2020-04-21 DIAGNOSIS — R319 Hematuria, unspecified: Secondary | ICD-10-CM | POA: Diagnosis not present

## 2020-04-21 LAB — HM MAMMOGRAPHY

## 2020-04-21 LAB — HM PAP SMEAR: HM Pap smear: NORMAL

## 2020-04-22 NOTE — Progress Notes (Signed)
Subjective:    Patient ID: Mary [name], female    DOB: Apr 20, 1965, 55 y.o.   MRN: [id number]  HPI The patient is here for an acute visit for high BP.   Went to her gyn recently and BP was high.  She checked it at home and it was 144/84 she denies low BPs.  She states typically she does not monitor on a regular basis and she only started checking it because it was high at her gynecologist office.  ? Increase in salt intake.  She has headaches and lightheadedness at times if she does not eat regularly.  She has occasional palpitations, which are not new and they have not increased.  She has occasional chest pain that she relates to GERD.  Medications and allergies reviewed with patient and updated if appropriate.  Patient Active Problem List   Diagnosis Date Noted  . Hypertension 07/30/2018  . Trigger finger, left middle finger 07/12/2018  . Common migraine 06/07/2017  . Chronic low back pain 12/21/2015  . Postmenopausal 05/15/2015  . Hyperlipidemia 11/17/2014  . 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 Obesity 11/17/2014  . Knee pain, right 04/09/2013  . Constipation 07/08/2009    Current Outpatient Medications on File Prior to Visit  Medication Sig Dispense Refill  . Aspirin-Acetaminophen-Caffeine (EXCEDRIN MIGRAINE PO) Take by mouth as needed.    Marland Kitchen atorvastatin (LIPITOR) 10 MG tablet TAKE 1 TABLET BY MOUTH ONCE DAILY 90 tablet 1  . Calcium Carbonate-Vitamin D 600-400 MG-UNIT tablet Take 1 tablet by mouth daily.    . cyclobenzaprine (FLEXERIL) 10 MG tablet TAKE 1 TABLET BY MOUTH AT BEDTIME. 90 tablet 1  . losartan (COZAAR) 25 MG tablet TAKE 1 TABLET BY MOUTH ONCE DAILY 90 tablet 1  . meloxicam (MOBIC) 15 MG tablet TAKE 1 TABLET BY MOUTH ONCE DAILY AS NEEDED 90 tablet 1  . Multiple Vitamin (MULTIVITAMIN) tablet Take 1 tablet by mouth daily.    . propranolol (INDERAL) 60 MG tablet TAKE 1 TABLET BY MOUTH TWICE DAILY 180 tablet 1   No current facility-administered medications on file  prior to visit.    Past Medical History:  Diagnosis Date  . Allergy   . Arthritis   . Headache(784.0)   . Hyperlipidemia     Past Surgical History:  Procedure Laterality Date  . CESAREAN SECTION      Social History   Socioeconomic History  . Marital status: Widowed    Spouse name: [name]  . Number of children: 2  . Years of education: 58  . Highest education level: Not on file  Occupational History    Employer: [employer]  Tobacco Use  . Smoking status: Never Smoker  . Smokeless tobacco: Never Used  Vaping Use  . Vaping Use: Never used  Substance and Sexual Activity  . Alcohol use: No  . Drug use: No  . Sexual activity: Yes  Other Topics Concern  . Not on file  Social History Narrative   Patient is right handed, resides with husband([name]),two children in a home.      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 Migraines Mother   . Colon cancer Neg Hx   .  Esophageal cancer Neg Hx   . Rectal cancer Neg Hx   . Stomach cancer Neg Hx   . Colon polyps Neg Hx     Review of Systems  Constitutional: Negative for fever.  Respiratory: Negative for shortness of breath.   Cardiovascular: Positive for chest pain (occ - usually when going to bed- when laying down - she feels full/ gerd) and palpitations (occ, transient). Negative for leg swelling.  Neurological: Positive for light-headedness (when BP is elevated or not eating regularly) and headaches (when BP is elevated or not eating regularly).       Objective:   Vitals:   04/23/20 1037  BP: 138/84  Pulse: 78  Temp: 97.9 F (36.6 C)  SpO2: 98%   BP Readings from Last 3 Encounters:  04/23/20 138/84  01/20/20 134/70  07/15/19 128/84   Wt  Readings from Last 3 Encounters:  04/23/20 194 lb (88 kg)  01/20/20 194 lb (88 kg)  07/15/19 196 lb (88.9 kg)   Body mass index is 31.31 kg/m.   Physical Exam    Constitutional: Appears well-developed and well-nourished. No distress.  Head: Normocephalic and atraumatic.  Neck: Neck supple. No tracheal deviation present. No thyromegaly present.  No cervical lymphadenopathy Cardiovascular: Normal rate, regular rhythm and normal heart sounds.  No murmur heard. No carotid bruit .  No edema Pulmonary/Chest: Effort normal and breath sounds normal. No respiratory distress. No has no wheezes. No rales.  Skin: Skin is warm and dry. Not diaphoretic.  Psychiatric: Normal mood and affect. Behavior is normal.       Assessment & Plan:    See Problem List for Assessment and Plan of chronic medical problems.    This visit occurred during the SARS-CoV-2 public health emergency.  Safety protocols were in place, including screening questions prior to the visit, additional usage of staff PPE, and extensive cleaning of exam room while observing appropriate contact time as indicated for disinfecting solutions.

## 2020-04-22 NOTE — Patient Instructions (Addendum)
Medications changes include :   Increase losartan to 50 mg daily  Your prescription(s) have been submitted to your pharmacy. Please take as directed and contact our office if you believe you are having problem(s) with the medication(s).    Hypertension, Adult High blood pressure (hypertension) is when the force of blood pumping through the arteries is too strong. The arteries are the blood vessels that carry blood from the heart throughout the body. Hypertension forces the heart to work harder to pump blood and may cause arteries to become narrow or stiff. Untreated or uncontrolled hypertension can cause a heart attack, heart failure, a stroke, kidney disease, and other problems. A blood pressure reading consists of a higher number over a lower number. Ideally, your blood pressure should be below 120/80. The first ("top") number is called the systolic pressure. It is a measure of the pressure in your arteries as your heart beats. The second ("bottom") number is called the diastolic pressure. It is a measure of the pressure in your arteries as the heart relaxes. What are the causes? The exact cause of this condition is not known. There are some conditions that result in or are related to high blood pressure. What increases the risk? Some risk factors for high blood pressure are under your control. The following factors may make you more likely to develop this condition:  Smoking.  Having type 2 diabetes mellitus, high cholesterol, or both.  Not getting enough exercise or physical activity.  Being overweight.  Having too much fat, sugar, calories, or salt (sodium) in your diet.  Drinking too much alcohol. Some risk factors for high blood pressure may be difficult or impossible to change. Some of these factors include:  Having chronic kidney disease.  Having a family history of high blood pressure.  Age. Risk increases with age.  Race. You may be at higher risk if you are African  American.  Gender. Men are at higher risk than women before age 28. After age 8, women are at higher risk than men.  Having obstructive sleep apnea.  Stress. What are the signs or symptoms? High blood pressure may not cause symptoms. Very high blood pressure (hypertensive crisis) may cause:  Headache.  Anxiety.  Shortness of breath.  Nosebleed.  Nausea and vomiting.  Vision changes.  Severe chest pain.  Seizures. How is this diagnosed? This condition is diagnosed by measuring your blood pressure while you are seated, with your arm resting on a flat surface, your legs uncrossed, and your feet flat on the floor. The cuff of the blood pressure monitor will be placed directly against the skin of your upper arm at the level of your heart. It should be measured at least twice using the same arm. Certain conditions can cause a difference in blood pressure between your right and left arms. Certain factors can cause blood pressure readings to be lower or higher than normal for a short period of time:  When your blood pressure is higher when you are in a health care provider's office than when you are at home, this is called white coat hypertension. Most people with this condition do not need medicines.  When your blood pressure is higher at home than when you are in a health care provider's office, this is called masked hypertension. Most people with this condition may need medicines to control blood pressure. If you have a high blood pressure reading during one visit or you have normal blood pressure with other risk  factors, you may be asked to:  Return on a different day to have your blood pressure checked again.  Monitor your blood pressure at home for 1 week or longer. If you are diagnosed with hypertension, you may have other blood or imaging tests to help your health care provider understand your overall risk for other conditions. How is this treated? This condition is treated by  making healthy lifestyle changes, such as eating healthy foods, exercising more, and reducing your alcohol intake. Your health care provider may prescribe medicine if lifestyle changes are not enough to get your blood pressure under control, and if:  Your systolic blood pressure is above 130.  Your diastolic blood pressure is above 80. Your personal target blood pressure may vary depending on your medical conditions, your age, and other factors. Follow these instructions at home: Eating and drinking  Eat a diet that is high in fiber and potassium, and low in sodium, added sugar, and fat. An example eating plan is called the DASH (Dietary Approaches to Stop Hypertension) diet. To eat this way: ? Eat plenty of fresh fruits and vegetables. Try to fill one half of your plate at each meal with fruits and vegetables. ? Eat whole grains, such as whole-wheat pasta, brown rice, or whole-grain bread. Fill about one fourth of your plate with whole grains. ? Eat or drink low-fat dairy products, such as skim milk or low-fat yogurt. ? Avoid fatty cuts of meat, processed or cured meats, and poultry with skin. Fill about one fourth of your plate with lean proteins, such as fish, chicken without skin, beans, eggs, or tofu. ? Avoid pre-made and processed foods. These tend to be higher in sodium, added sugar, and fat.  Reduce your daily sodium intake. Most people with hypertension should eat less than 1,500 mg of sodium a day.  Do not drink alcohol if: ? Your health care provider tells you not to drink. ? You are pregnant, may be pregnant, or are planning to become pregnant.  If you drink alcohol: ? Limit how much you use to:  0-1 drink a day for women.  0-2 drinks a day for men. ? Be aware of how much alcohol is in your drink. In the [place] one drink equals one 12 oz bottle of beer (355 mL), one 5 oz glass of wine (148 mL), or one 1 oz glass of hard liquor (44 mL).   Lifestyle  Work with your health  care provider to maintain a healthy body weight or to lose weight. Ask what an ideal weight is for you.  Get at least 30 minutes of exercise most days of the week. Activities may include walking, swimming, or biking.  Include exercise to strengthen your muscles (resistance exercise), such as Pilates or lifting weights, as part of your weekly exercise routine. Try to do these types of exercises for 30 minutes at least 3 days a week.  Do not use any products that contain nicotine or tobacco, such as cigarettes, e-cigarettes, and chewing tobacco. If you need help quitting, ask your health care provider.  Monitor your blood pressure at home as told by your health care provider.  Keep all follow-up visits as told by your health care provider. This is important.   Medicines  Take over-the-counter and prescription medicines only as told by your health care provider. Follow directions carefully. Blood pressure medicines must be taken as prescribed.  Do not skip doses of blood pressure medicine. Doing this puts you at risk  for problems and can make the medicine less effective.  Ask your health care provider about side effects or reactions to medicines that you should watch for. Contact a health care provider if you:  Think you are having a reaction to a medicine you are taking.  Have headaches that keep coming back (recurring).  Feel dizzy.  Have swelling in your ankles.  Have trouble with your vision. Get help right away if you:  Develop a severe headache or confusion.  Have unusual weakness or numbness.  Feel faint.  Have severe pain in your chest or abdomen.  Vomit repeatedly.  Have trouble breathing. Summary  Hypertension is when the force of blood pumping through your arteries is too strong. If this condition is not controlled, it may put you at risk for serious complications.  Your personal target blood pressure may vary depending on your medical conditions, your age, and  other factors. For most people, a normal blood pressure is less than 120/80.  Hypertension is treated with lifestyle changes, medicines, or a combination of both. Lifestyle changes include losing weight, eating a healthy, low-sodium diet, exercising more, and limiting alcohol. This information is not intended to replace advice given to you by your health care provider. Make sure you discuss any questions you have with your health care provider. Document Revised: 09/13/2017 Document Reviewed: 09/13/2017 Elsevier Patient Education  2021 [employer].

## 2020-04-23 ENCOUNTER — Other Ambulatory Visit: Payer: Self-pay

## 2020-04-23 ENCOUNTER — Encounter: Payer: Self-pay | Admitting: Internal Medicine

## 2020-04-23 ENCOUNTER — Other Ambulatory Visit (HOSPITAL_COMMUNITY): Payer: Self-pay

## 2020-04-23 ENCOUNTER — Ambulatory Visit (INDEPENDENT_AMBULATORY_CARE_PROVIDER_SITE_OTHER): Payer: 59 | Admitting: Internal Medicine

## 2020-04-23 VITALS — BP 138/84 | HR 78 | Temp 97.9°F | Ht 66.0 in | Wt 194.0 lb

## 2020-04-23 DIAGNOSIS — I10 Essential (primary) hypertension: Secondary | ICD-10-CM

## 2020-04-23 MED ORDER — LOSARTAN POTASSIUM 50 MG PO TABS
50.0000 mg | ORAL_TABLET | Freq: Every day | ORAL | 3 refills | Status: DC
  Filled 2020-04-23 – 2020-05-02 (×2): qty 90, 90d supply, fill #0
  Filled 2020-08-26: qty 90, 90d supply, fill #1
  Filled 2020-11-23: qty 90, 90d supply, fill #2
  Filled 2021-02-19: qty 90, 90d supply, fill #3

## 2020-04-23 NOTE — Assessment & Plan Note (Signed)
Chronic Not well controlled Increase losartan to 50 mg daily Discussed the importance of a low-sodium diet, regular exercise and weight loss Advised her to monitor her blood pressure at home-discussed goal She will contact me via MyChart if her BP is not well controlled

## 2020-04-30 ENCOUNTER — Other Ambulatory Visit (HOSPITAL_COMMUNITY): Payer: Self-pay

## 2020-05-02 ENCOUNTER — Other Ambulatory Visit (HOSPITAL_COMMUNITY): Payer: Self-pay

## 2020-05-12 ENCOUNTER — Other Ambulatory Visit (HOSPITAL_COMMUNITY): Payer: Self-pay

## 2020-05-12 MED FILL — Cyclobenzaprine HCl Tab 10 MG: ORAL | 30 days supply | Qty: 30 | Fill #0 | Status: AC

## 2020-06-01 ENCOUNTER — Other Ambulatory Visit (HOSPITAL_COMMUNITY): Payer: Self-pay

## 2020-06-01 MED FILL — Atorvastatin Calcium Tab 10 MG (Base Equivalent): ORAL | 90 days supply | Qty: 90 | Fill #0 | Status: AC

## 2020-06-08 ENCOUNTER — Other Ambulatory Visit (HOSPITAL_COMMUNITY): Payer: Self-pay

## 2020-06-08 MED FILL — Meloxicam Tab 15 MG: ORAL | 90 days supply | Qty: 90 | Fill #0 | Status: AC

## 2020-06-08 MED FILL — Propranolol HCl Tab 60 MG: ORAL | 90 days supply | Qty: 180 | Fill #0 | Status: AC

## 2020-06-08 MED FILL — Cyclobenzaprine HCl Tab 10 MG: ORAL | 30 days supply | Qty: 30 | Fill #1 | Status: AC

## 2020-06-09 ENCOUNTER — Other Ambulatory Visit (HOSPITAL_COMMUNITY): Payer: Self-pay

## 2020-06-11 ENCOUNTER — Other Ambulatory Visit (HOSPITAL_COMMUNITY): Payer: Self-pay

## 2020-06-11 ENCOUNTER — Other Ambulatory Visit: Payer: Self-pay | Admitting: Internal Medicine

## 2020-06-11 MED ORDER — ATORVASTATIN CALCIUM 10 MG PO TABS
ORAL_TABLET | Freq: Every day | ORAL | 1 refills | Status: DC
  Filled 2020-06-11 – 2020-08-26 (×2): qty 90, 90d supply, fill #0
  Filled 2020-11-23: qty 90, 90d supply, fill #1

## 2020-06-12 ENCOUNTER — Other Ambulatory Visit (HOSPITAL_COMMUNITY): Payer: Self-pay

## 2020-07-15 ENCOUNTER — Other Ambulatory Visit: Payer: Self-pay | Admitting: Internal Medicine

## 2020-07-15 MED ORDER — CYCLOBENZAPRINE HCL 10 MG PO TABS
ORAL_TABLET | Freq: Every day | ORAL | 5 refills | Status: DC
  Filled 2020-07-15: qty 30, 30d supply, fill #0

## 2020-07-15 NOTE — Telephone Encounter (Signed)
Patient is requesting a refill of the following medications: Requested Prescriptions   Pending Prescriptions Disp Refills   cyclobenzaprine (FLEXERIL) 10 MG tablet 30 tablet 5    Sig: Take by mouth at bedtime.    Date of patient request: 07/15/20  Last office visit: 04/23/20  Date of last refill: 07/19/19  Last refill amount: 30,5   Follow up time period per chart: 07/21/20

## 2020-07-16 ENCOUNTER — Other Ambulatory Visit (HOSPITAL_COMMUNITY): Payer: Self-pay

## 2020-07-20 ENCOUNTER — Encounter: Payer: Self-pay | Admitting: Internal Medicine

## 2020-07-20 NOTE — Progress Notes (Signed)
Subjective:    Patient ID: Mary [name], female    DOB: [birth year], 55 y.o.   MRN: [id number]   This visit occurred during the SARS-CoV-2 public health emergency.  Safety protocols were in place, including screening questions prior to the visit, additional usage of staff PPE, and extensive cleaning of exam room while observing appropriate contact time as indicated for disinfecting solutions.    HPI She is here for a physical exam.   Recently has had a recurrent fever blister.  She denies recent illness.  This just started happening more - once a month for the past three months. She puts on topical ointment.  The ointment helps.  She just did not understand why she kept getting  She has no other concerns.    Medications and allergies reviewed with patient and updated if appropriate.  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     atorvastatin (LIPITOR) 10 MG tablet TAKE 1 TABLET BY MOUTH ONCE DAILY 90 tablet 1   Calcium Carbonate-Vitamin D 600-400 MG-UNIT tablet Take 1 tablet by mouth daily.     cyclobenzaprine (FLEXERIL) 10 MG tablet Take 1 tablet by mouth at bedtime. 30 tablet 5   losartan (COZAAR) 50 MG tablet Take 1 tablet (50 mg total) by mouth daily. 90 tablet 3   meloxicam (MOBIC) 15 MG tablet TAKE 1 TABLET BY MOUTH ONCE DAILY AS NEEDED 90 tablet 1   Multiple Vitamin (MULTIVITAMIN) tablet Take 1 tablet by mouth daily.     propranolol (INDERAL) 60 MG tablet TAKE 1 TABLET BY MOUTH TWICE DAILY 180  tablet 1   No current facility-administered medications on file prior to visit.    Past Medical History:  Diagnosis Date   Allergy    Arthritis    Headache(784.0)    Hyperlipidemia     Past Surgical History:  Procedure Laterality Date   CESAREAN SECTION      Social History   Socioeconomic History   Marital status: Widowed    Spouse name: [name]   Number of children: 2   Years of education: 12   Highest education level: Not on file  Occupational History    Employer: [employer]  Tobacco Use   Smoking status: Never   Smokeless tobacco: Never  Vaping Use   Vaping Use: Never used  Substance and Sexual Activity   Alcohol use: No   Drug use: No   Sexual activity: Yes  Other Topics Concern   Not on file  Social History Narrative   Patient is right handed, resides with husband([name]),two children in a home.      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  Eyes:  Positive for visual disturbance (w/ migraines).  Respiratory:  Negative for cough, shortness of breath and wheezing.   Cardiovascular:  Positive for palpitations (with dehydration) and leg swelling (sometimes at end of day). Negative for chest pain.  Gastrointestinal:  Positive for constipation. Negative for abdominal pain, blood in stool, diarrhea and nausea.  Genitourinary:  Negative for dysuria.  Musculoskeletal:  Positive for arthralgias (mild, hands) and back pain.  Skin:  Negative for rash.  Neurological:  Positive for  light-headedness (with migraines) and headaches (occ migraines).  Psychiatric/Behavioral:  Negative for dysphoric mood. The patient is nervous/anxious (occ).       Objective:   Vitals:   07/21/20 0935  BP: 124/84  Pulse: 60  Temp: 97.9 F (36.6 C)  SpO2: 98%   Filed Weights   07/21/20 0935  Weight: 193 lb (87.5 kg)   Body mass index is 31.15 kg/m.  BP Readings from Last 3 Encounters:  07/21/20 124/84  04/23/20 138/84  01/20/20 134/70    Wt Readings from Last 3 Encounters:  07/21/20 193 lb (87.5 kg)  04/23/20 194 lb (88 kg)  01/20/20 194 lb (88 kg)     Physical Exam Constitutional: She appears well-developed and well-nourished. No distress.  HENT:  Head: Normocephalic and atraumatic.  Right Ear: External ear normal. Normal ear canal and TM Left Ear: External ear normal.  Normal ear canal and TM Mouth/Throat: Oropharynx is clear and moist.  Eyes: Conjunctivae and EOM are normal.  Neck: Neck supple. No tracheal deviation present. No thyromegaly present.  No carotid bruit  Cardiovascular: Normal rate, regular rhythm and normal heart sounds.   No murmur heard.  No edema. Pulmonary/Chest: Effort normal and breath sounds normal. No respiratory distress. She has no wheezes. She has no rales.  Breast: deferred   Abdominal: Soft. She exhibits no distension. There is no tenderness.  Lymphadenopathy: She has no cervical adenopathy.  Skin: Skin is warm and dry. She is not diaphoretic.  Psychiatric: She has a normal mood and affect. Her behavior is normal.        Assessment & Plan:   Physical exam: Screening blood work    ordered Immunizations  shingrix due, discussed covid boosters Colonoscopy  Up to date  Mammogram  Up to date  Gyn  Up to date  Exercise  walking Weight  encouraged weight loss Substance abuse  none      See Problem List for Assessment and Plan of chronic medical problems.

## 2020-07-20 NOTE — Patient Instructions (Addendum)
You had an EKG today.   Blood work was ordered.     Medications changes include :   none    Your prescription(s) have been submitted to your pharmacy. Please take as directed and contact our office if you believe you are having problem(s) with the medication(s).    Please followup in 6 months   Health Maintenance, Female Adopting a healthy lifestyle and getting preventive care are important in promoting health and wellness. Ask your health care provider about: The right schedule for you to have regular tests and exams. Things you can do on your own to prevent diseases and keep yourself healthy. What should I know about diet, weight, and exercise? Eat a healthy diet  Eat a diet that includes plenty of vegetables, fruits, low-fat dairy products, and lean protein. Do not eat a lot of foods that are high in solid fats, added sugars, or sodium.  Maintain a healthy weight Body mass index (BMI) is used to identify weight problems. It estimates body fat based on height and weight. Your health care provider can help determineyour BMI and help you achieve or maintain a healthy weight. Get regular exercise Get regular exercise. This is one of the most important things you can do for your health. Most adults should: Exercise for at least 150 minutes each week. The exercise should increase your heart rate and make you sweat (moderate-intensity exercise). Do strengthening exercises at least twice a week. This is in addition to the moderate-intensity exercise. Spend less time sitting. Even light physical activity can be beneficial. Watch cholesterol and blood lipids Have your blood tested for lipids and cholesterol at 53 years of age, then havethis test every 5 years. Have your cholesterol levels checked more often if: Your lipid or cholesterol levels are high. You are older than 53 years of age. You are at high risk for heart disease. What should I know about cancer screening? Depending  on your health history and family history, you may need to have cancer screening at various ages. This may include screening for: Breast cancer. Cervical cancer. Colorectal cancer. Skin cancer. Lung cancer. What should I know about heart disease, diabetes, and high blood pressure? Blood pressure and heart disease High blood pressure causes heart disease and increases the risk of stroke. This is more likely to develop in people who have high blood pressure readings, are of African descent, or are overweight. Have your blood pressure checked: Every 3-5 years if you are 63-55 years of age. Every year if you are 51 years old or older. Diabetes Have regular diabetes screenings. This checks your fasting blood sugar level. Have the screening done: Once every three years after age 13 if you are at a normal weight and have a low risk for diabetes. More often and at a younger age if you are overweight or have a high risk for diabetes. What should I know about preventing infection? Hepatitis B If you have a higher risk for hepatitis B, you should be screened for this virus. Talk with your health care provider to find out if you are at risk forhepatitis B infection. Hepatitis C Testing is recommended for: Everyone born from [birth year] through 1963. Anyone with known risk factors for hepatitis C. Sexually transmitted infections (STIs) Get screened for STIs, including gonorrhea and chlamydia, if: You are sexually active and are younger than 53 years of age. You are older than 53 years of age and your health care provider tells you that you are  at risk for this type of infection. Your sexual activity has changed since you were last screened, and you are at increased risk for chlamydia or gonorrhea. Ask your health care provider if you are at risk. Ask your health care provider about whether you are at high risk for HIV. Your health care provider may recommend a prescription medicine to help prevent HIV  infection. If you choose to take medicine to prevent HIV, you should first get tested for HIV. You should then be tested every 3 months for as long as you are taking the medicine. Pregnancy If you are about to stop having your period (premenopausal) and you may become pregnant, seek counseling before you get pregnant. Take 400 to 800 micrograms (mcg) of folic acid every day if you become pregnant. Ask for birth control (contraception) if you want to prevent pregnancy. Osteoporosis and menopause Osteoporosis is a disease in which the bones lose minerals and strength with aging. This can result in bone fractures. If you are 28 years old or older, or if you are at risk for osteoporosis and fractures, ask your health care provider if you should: Be screened for bone loss. Take a calcium or vitamin D supplement to lower your risk of fractures. Be given hormone replacement therapy (HRT) to treat symptoms of menopause. Follow these instructions at home: Lifestyle Do not use any products that contain nicotine or tobacco, such as cigarettes, e-cigarettes, and chewing tobacco. If you need help quitting, ask your health care provider. Do not use street drugs. Do not share needles. Ask your health care provider for help if you need support or information about quitting drugs. Alcohol use Do not drink alcohol if: Your health care provider tells you not to drink. You are pregnant, may be pregnant, or are planning to become pregnant. If you drink alcohol: Limit how much you use to 0-1 drink a day. Limit intake if you are breastfeeding. Be aware of how much alcohol is in your drink. In the [place] one drink equals one 12 oz bottle of beer (355 mL), one 5 oz glass of wine (148 mL), or one 1 oz glass of hard liquor (44 mL). General instructions Schedule regular health, dental, and eye exams. Stay current with your vaccines. Tell your health care provider if: You often feel depressed. You have ever been  abused or do not feel safe at home. Summary Adopting a healthy lifestyle and getting preventive care are important in promoting health and wellness. Follow your health care provider's instructions about healthy diet, exercising, and getting tested or screened for diseases. Follow your health care provider's instructions on monitoring your cholesterol and blood pressure. This information is not intended to replace advice given to you by your health care provider. Make sure you discuss any questions you have with your healthcare provider. Document Revised: 12/27/2017 Document Reviewed: 12/27/2017 Elsevier Patient Education  2022 [employer].

## 2020-07-21 ENCOUNTER — Encounter: Payer: Self-pay | Admitting: Internal Medicine

## 2020-07-21 ENCOUNTER — Other Ambulatory Visit: Payer: Self-pay

## 2020-07-21 ENCOUNTER — Other Ambulatory Visit (HOSPITAL_COMMUNITY): Payer: Self-pay

## 2020-07-21 ENCOUNTER — Ambulatory Visit (INDEPENDENT_AMBULATORY_CARE_PROVIDER_SITE_OTHER): Payer: 59 | Admitting: Internal Medicine

## 2020-07-21 VITALS — BP 124/84 | HR 60 | Temp 97.9°F | Ht 66.0 in | Wt 193.0 lb

## 2020-07-21 DIAGNOSIS — R739 Hyperglycemia, unspecified: Secondary | ICD-10-CM | POA: Diagnosis not present

## 2020-07-21 DIAGNOSIS — E782 Mixed hyperlipidemia: Secondary | ICD-10-CM | POA: Diagnosis not present

## 2020-07-21 DIAGNOSIS — R7303 Prediabetes: Secondary | ICD-10-CM | POA: Insufficient documentation

## 2020-07-21 DIAGNOSIS — B001 Herpesviral vesicular dermatitis: Secondary | ICD-10-CM

## 2020-07-21 DIAGNOSIS — G8929 Other chronic pain: Secondary | ICD-10-CM | POA: Diagnosis not present

## 2020-07-21 DIAGNOSIS — M545 Low back pain, unspecified: Secondary | ICD-10-CM

## 2020-07-21 DIAGNOSIS — Z0000 Encounter for general adult medical examination without abnormal findings: Secondary | ICD-10-CM

## 2020-07-21 DIAGNOSIS — K5900 Constipation, unspecified: Secondary | ICD-10-CM | POA: Diagnosis not present

## 2020-07-21 DIAGNOSIS — G43009 Migraine without aura, not intractable, without status migrainosus: Secondary | ICD-10-CM

## 2020-07-21 DIAGNOSIS — I10 Essential (primary) hypertension: Secondary | ICD-10-CM

## 2020-07-21 LAB — LIPID PANEL
Cholesterol: 163 mg/dL (ref 0–200)
HDL: 66.6 mg/dL (ref >39.00)
LDL Cholesterol: 83 mg/dL (ref 0–99)
NonHDL: 96.76
Total CHOL/HDL Ratio: 2
Triglycerides: 69 mg/dL (ref 0.0–149.0)
VLDL: 13.8 mg/dL (ref 0.0–40.0)

## 2020-07-21 LAB — COMPREHENSIVE METABOLIC PANEL
ALT: 19 U/L (ref 0–35)
AST: 20 U/L (ref 0–37)
Albumin: 4.1 g/dL (ref 3.5–5.2)
Alkaline Phosphatase: 76 U/L (ref 39–117)
BUN: 12 mg/dL (ref 6–23)
CO2: 31 mEq/L (ref 19–32)
Calcium: 9.5 mg/dL (ref 8.4–10.5)
Chloride: 101 mEq/L (ref 96–112)
Creatinine, Ser: 0.77 mg/dL (ref 0.40–1.20)
GFR: 88.31 mL/min (ref >60.00)
Glucose, Bld: 87 mg/dL (ref 70–99)
Potassium: 4.1 mEq/L (ref 3.5–5.1)
Sodium: 138 mEq/L (ref 135–145)
Total Bilirubin: 1.1 mg/dL (ref 0.2–1.2)
Total Protein: 7.7 g/dL (ref 6.0–8.3)

## 2020-07-21 LAB — CBC WITH DIFFERENTIAL/PLATELET
Basophils Absolute: 0 10*3/uL (ref 0.0–0.1)
Basophils Relative: 0.4 % (ref 0.0–3.0)
Eosinophils Absolute: 0.1 10*3/uL (ref 0.0–0.7)
Eosinophils Relative: 3.1 % (ref 0.0–5.0)
HCT: 37.1 % (ref 36.0–46.0)
Hemoglobin: 12.5 g/dL (ref 12.0–15.0)
Lymphocytes Relative: 39.5 % (ref 12.0–46.0)
Lymphs Abs: 1.7 10*3/uL (ref 0.7–4.0)
MCHC: 33.8 g/dL (ref 30.0–36.0)
MCV: 90.4 fl (ref 78.0–100.0)
Monocytes Absolute: 0.4 10*3/uL (ref 0.1–1.0)
Monocytes Relative: 8 % (ref 3.0–12.0)
Neutro Abs: 2.2 10*3/uL (ref 1.4–7.7)
Neutrophils Relative %: 49 % (ref 43.0–77.0)
Platelets: 186 10*3/uL (ref 150.0–400.0)
RBC: 4.1 Mil/uL (ref 3.87–5.11)
RDW: 14.3 % (ref 11.5–15.5)
WBC: 4.4 10*3/uL (ref 4.0–10.5)

## 2020-07-21 LAB — TSH: TSH: 0.51 u[IU]/mL (ref 0.35–5.50)

## 2020-07-21 LAB — HEMOGLOBIN A1C: Hgb A1c MFr Bld: 6.2 % (ref 4.6–6.5)

## 2020-07-21 MED ORDER — CYCLOBENZAPRINE HCL 10 MG PO TABS
ORAL_TABLET | Freq: Every day | ORAL | 1 refills | Status: DC
  Filled 2020-07-21: qty 90, fill #0
  Filled 2020-07-22: qty 90, 90d supply, fill #0
  Filled 2020-10-21: qty 90, 90d supply, fill #1

## 2020-07-21 NOTE — Assessment & Plan Note (Addendum)
Chronic Has had more headaches recently Continue propranolol 60 mg bid excedrin migraine prn Increase fluids and eat more often to help prevent migraines

## 2020-07-21 NOTE — Assessment & Plan Note (Signed)
Chronic Continue atorvastatin 10 mg daily Check lipid panel Continue regular exercise, healthy diet

## 2020-07-21 NOTE — Assessment & Plan Note (Signed)
Chronic Controlled Stretches Continue mobic 15 mg daily Continue flexeril 10 mg daily prn

## 2020-07-21 NOTE — Assessment & Plan Note (Signed)
Acute Discussed most common causes of recurrent cold sores Discussed that most likely this will decrease as her body is able to contain the virus better Discussed Valtrex as needed, over-the-counter lysine-deferred both for now She will continue OTC topical treatments as needed

## 2020-07-21 NOTE — Assessment & Plan Note (Addendum)
Chronic BP well controlled Continue losartan 50 mg qd, propranolol 60 mg bid cmp  EKG:  Sinus bradycardia at 59 bpm, nonspecific T wave abnormality, otherwise normal EKG.  No prior EKG for comparison.

## 2020-07-21 NOTE — Assessment & Plan Note (Signed)
Chronic Getting worse Taking stool softener prn - advised to take daily Increase water intake  If above does not work take Stage manager daily

## 2020-07-22 ENCOUNTER — Other Ambulatory Visit (HOSPITAL_COMMUNITY): Payer: Self-pay

## 2020-08-03 ENCOUNTER — Encounter: Payer: Self-pay | Admitting: Internal Medicine

## 2020-08-03 NOTE — Telephone Encounter (Signed)
Patient called to check on the status of the FMLA paperwork. The unsigned copy is scanned in [employer].   Please advise.

## 2020-08-06 ENCOUNTER — Telehealth: Payer: Self-pay

## 2020-08-06 NOTE — Telephone Encounter (Signed)
Paperwork printed and filed out for intermediate leave for work.  Placed and waiting for Dr. Quay Burow to sign off on paperwork.  Patient notified that when paperwork is signed it will be faxed.

## 2020-08-10 DIAGNOSIS — Z0279 Encounter for issue of other medical certificate: Secondary | ICD-10-CM

## 2020-08-10 NOTE — Telephone Encounter (Signed)
Forms completed and signed.  Faxed today and conformation received.  Patient notified via my-chart that forms had been completed and faxed.  Posting sheet given to Amy for billing.

## 2020-08-26 ENCOUNTER — Other Ambulatory Visit (HOSPITAL_COMMUNITY): Payer: Self-pay

## 2020-08-26 ENCOUNTER — Other Ambulatory Visit: Payer: Self-pay | Admitting: Internal Medicine

## 2020-08-26 MED ORDER — MELOXICAM 15 MG PO TABS
ORAL_TABLET | Freq: Every day | ORAL | 1 refills | Status: DC | PRN
  Filled 2020-08-26: qty 90, 90d supply, fill #0
  Filled 2020-11-23: qty 90, 90d supply, fill #1

## 2020-08-27 ENCOUNTER — Other Ambulatory Visit (HOSPITAL_COMMUNITY): Payer: Self-pay

## 2020-10-21 ENCOUNTER — Other Ambulatory Visit (HOSPITAL_COMMUNITY): Payer: Self-pay

## 2020-11-23 ENCOUNTER — Other Ambulatory Visit (HOSPITAL_COMMUNITY): Payer: Self-pay

## 2020-12-21 ENCOUNTER — Other Ambulatory Visit (HOSPITAL_COMMUNITY): Payer: Self-pay

## 2020-12-21 ENCOUNTER — Other Ambulatory Visit: Payer: Self-pay | Admitting: Internal Medicine

## 2020-12-21 MED ORDER — PROPRANOLOL HCL 60 MG PO TABS
ORAL_TABLET | Freq: Two times a day (BID) | ORAL | 1 refills | Status: DC
  Filled 2020-12-21: qty 180, 90d supply, fill #0
  Filled 2021-05-28: qty 180, 90d supply, fill #1

## 2020-12-23 ENCOUNTER — Other Ambulatory Visit (HOSPITAL_COMMUNITY): Payer: Self-pay

## 2021-01-20 NOTE — Patient Instructions (Addendum)
    Blood work was ordered.      Medications changes include :   none     Please followup in 6 months  

## 2021-01-20 NOTE — Progress Notes (Signed)
Subjective:    Patient ID: Mary [name], female    DOB: 1965-09-26, 56 y.o.   MRN: [id number]  This visit occurred during the SARS-CoV-2 public health emergency.  Safety protocols were in place, including screening questions prior to the visit, additional usage of staff PPE, and extensive cleaning of exam room while observing appropriate contact time as indicated for disinfecting solutions.     HPI The patient is here for follow up of their chronic medical problems, including htn, prediabetes, migraines, hld, back pain - msk  She is walking.  She is eating pretty well.    Medications and allergies reviewed with patient and updated if appropriate.  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     atorvastatin (LIPITOR) 10 MG tablet TAKE 1 TABLET BY MOUTH ONCE DAILY 90 tablet 1   Calcium Carbonate-Vitamin D 600-400 MG-UNIT tablet Take 1 tablet by mouth daily.     cyclobenzaprine (FLEXERIL) 10 MG tablet Take 1 tablet by mouth at bedtime. 90 tablet 1   losartan (COZAAR) 50 MG tablet Take 1 tablet (50 mg total) by mouth daily. 90 tablet 3   meloxicam (MOBIC) 15 MG tablet TAKE 1 TABLET BY MOUTH ONCE DAILY AS NEEDED 90 tablet 1   Multiple Vitamin (MULTIVITAMIN) tablet Take 1 tablet by mouth daily.     propranolol (INDERAL) 60 MG tablet TAKE 1 TABLET BY MOUTH TWICE DAILY 180 tablet 1   No current facility-administered medications on file prior to visit.    Past Medical  History:  Diagnosis Date   Allergy    Arthritis    Headache(784.0)    Hyperlipidemia     Past Surgical History:  Procedure Laterality Date   CESAREAN SECTION      Social History   Socioeconomic History   Marital status: Widowed    Spouse name: [name]   Number of children: 2   Years of education: 12   Highest education level: Not on file  Occupational History    Employer: [employer]  Tobacco Use   Smoking status: Never   Smokeless tobacco: Never  Vaping Use   Vaping Use: Never used  Substance and Sexual Activity   Alcohol use: No   Drug use: No   Sexual activity: Yes  Other Topics Concern   Not on file  Social History Narrative   Patient is right handed, resides with husband([name]),two children in a home.      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  Respiratory:  Negative for cough, shortness of breath and wheezing.   Cardiovascular:  Positive for palpitations (occ anxiety) and leg swelling (after being on feet all day). Negative for chest pain.  Neurological:  Positive for headaches (migraines controlled). Negative for dizziness and light-headedness.      Objective:   Vitals:   01/21/21 0931  BP: 132/78  Pulse: 68  Temp: 98.1 F (36.7 C)  SpO2: 98%   BP Readings from Last 3 Encounters:  01/21/21 132/78  07/21/20 124/84  04/23/20 138/84   Wt Readings from Last 3 Encounters:  01/21/21 190 lb 3.2 oz (86.3 kg)  07/21/20 193 lb (87.5 kg)  04/23/20 194 lb (88 kg)   Body mass index  is 30.7 kg/m.   Depression screen [facility] 2/9 01/21/2021 08/01/2019 07/30/2018 07/12/2018 11/10/2017  Decreased Interest 1 0 0 0 0  Down, Depressed, Hopeless 0 0 0 0 0  PHQ - 2 Score 1 0 0 0 0  Altered sleeping 1 - - - 0  Tired, decreased energy 1 - - - 0  Change in appetite 1 - - - 0  Feeling bad or failure about yourself  0 - - - 0  Trouble concentrating 0 - - - 0  Moving slowly or fidgety/restless 1 - - - 0  Suicidal thoughts 0 - - - 0  PHQ-9 Score 5 - - - 0  Difficult doing work/chores Not difficult at all - - - -    GAD 7 : Generalized Anxiety Score 01/21/2021  Nervous, Anxious, on Edge 0  Control/stop worrying 1  Worry too much - different things 1  Trouble relaxing 0  Restless 0  Easily annoyed or irritable 0  Afraid - awful might happen 1  Total GAD 7 Score 3  Anxiety Difficulty Not difficult at all      Physical Exam    Constitutional: Appears well-developed and well-nourished. No distress.  HENT:  Head: Normocephalic and atraumatic.  Neck: Neck supple. No tracheal deviation present. No thyromegaly present.  No cervical lymphadenopathy Cardiovascular: Normal rate, regular rhythm and normal heart sounds.   No murmur heard. No carotid bruit .  No edema Pulmonary/Chest: Effort normal and breath sounds normal. No respiratory distress. No has no wheezes. No rales.  Skin: Skin is warm and dry. Not diaphoretic.  Psychiatric: Normal mood and affect. Behavior is normal.      Assessment & Plan:    Screened for depression using the PHQ 9 scale shows possible mild depression.  She denies depression and does not feel she needs any treatment at this time - will monitor.  Screened for anxiety using GAD7 Scale.. Shows possible mild anxiety . she may have some mild anxiety, but does not feel it is significant and does not feel like she needs any treatment at this time - will monitor.    See Problem List for Assessment and Plan of chronic medical problems.

## 2021-01-21 ENCOUNTER — Other Ambulatory Visit: Payer: Self-pay

## 2021-01-21 ENCOUNTER — Encounter: Payer: Self-pay | Admitting: Internal Medicine

## 2021-01-21 ENCOUNTER — Ambulatory Visit: Payer: 59 | Admitting: Internal Medicine

## 2021-01-21 VITALS — BP 132/78 | HR 68 | Temp 98.1°F | Ht 66.0 in | Wt 190.2 lb

## 2021-01-21 DIAGNOSIS — M545 Low back pain, unspecified: Secondary | ICD-10-CM | POA: Diagnosis not present

## 2021-01-21 DIAGNOSIS — G43009 Migraine without aura, not intractable, without status migrainosus: Secondary | ICD-10-CM

## 2021-01-21 DIAGNOSIS — E782 Mixed hyperlipidemia: Secondary | ICD-10-CM

## 2021-01-21 DIAGNOSIS — G8929 Other chronic pain: Secondary | ICD-10-CM

## 2021-01-21 DIAGNOSIS — R7303 Prediabetes: Secondary | ICD-10-CM

## 2021-01-21 DIAGNOSIS — I10 Essential (primary) hypertension: Secondary | ICD-10-CM

## 2021-01-21 LAB — COMPREHENSIVE METABOLIC PANEL
ALT: 17 U/L (ref 0–35)
AST: 21 U/L (ref 0–37)
Albumin: 3.9 g/dL (ref 3.5–5.2)
Alkaline Phosphatase: 73 U/L (ref 39–117)
BUN: 13 mg/dL (ref 6–23)
CO2: 32 mEq/L (ref 19–32)
Calcium: 9.2 mg/dL (ref 8.4–10.5)
Chloride: 102 mEq/L (ref 96–112)
Creatinine, Ser: 0.75 mg/dL (ref 0.40–1.20)
GFR: 90.82 mL/min (ref >60.00)
Glucose, Bld: 92 mg/dL (ref 70–99)
Potassium: 4.1 mEq/L (ref 3.5–5.1)
Sodium: 138 mEq/L (ref 135–145)
Total Bilirubin: 1.1 mg/dL (ref 0.2–1.2)
Total Protein: 7.4 g/dL (ref 6.0–8.3)

## 2021-01-21 LAB — HEMOGLOBIN A1C: Hgb A1c MFr Bld: 6.2 % (ref 4.6–6.5)

## 2021-01-21 NOTE — Assessment & Plan Note (Signed)
Chronic Check lipid panel  Continue atorvastatin 10 mg daily Regular exercise and healthy diet encouraged  

## 2021-01-21 NOTE — Assessment & Plan Note (Signed)
Chronic Controlled, stable Continue propranolol 60 mg bid excedrin migraine prn

## 2021-01-21 NOTE — Assessment & Plan Note (Signed)
Chronic BP well controlled Continue propranolol 60 mg bid, losartan 50 mg daily cmp

## 2021-01-21 NOTE — Assessment & Plan Note (Signed)
Chronic Intermittent Taking meloxicam once a day Takes flexeril as needed only

## 2021-01-21 NOTE — Assessment & Plan Note (Signed)
Chronic Check a1c Low sugar / carb diet Stressed regular exercise  

## 2021-02-03 ENCOUNTER — Encounter: Payer: Self-pay | Admitting: Internal Medicine

## 2021-02-06 ENCOUNTER — Encounter: Payer: Self-pay | Admitting: Internal Medicine

## 2021-02-19 ENCOUNTER — Other Ambulatory Visit: Payer: Self-pay | Admitting: Internal Medicine

## 2021-02-19 ENCOUNTER — Other Ambulatory Visit (HOSPITAL_COMMUNITY): Payer: Self-pay

## 2021-02-19 MED ORDER — ATORVASTATIN CALCIUM 10 MG PO TABS
ORAL_TABLET | Freq: Every day | ORAL | 1 refills | Status: DC
  Filled 2021-02-19: qty 90, 90d supply, fill #0
  Filled 2021-05-28: qty 90, 90d supply, fill #1

## 2021-02-19 MED ORDER — CYCLOBENZAPRINE HCL 10 MG PO TABS
ORAL_TABLET | Freq: Every day | ORAL | 1 refills | Status: DC
  Filled 2021-02-19: qty 90, 90d supply, fill #0
  Filled 2021-05-28: qty 90, 90d supply, fill #1

## 2021-02-19 MED ORDER — MELOXICAM 15 MG PO TABS
ORAL_TABLET | Freq: Every day | ORAL | 1 refills | Status: DC | PRN
  Filled 2021-02-19: qty 90, 90d supply, fill #0
  Filled 2021-05-28: qty 90, 90d supply, fill #1

## 2021-03-05 ENCOUNTER — Other Ambulatory Visit: Payer: Self-pay

## 2021-03-05 ENCOUNTER — Ambulatory Visit (INDEPENDENT_AMBULATORY_CARE_PROVIDER_SITE_OTHER): Payer: 59

## 2021-03-05 DIAGNOSIS — Z23 Encounter for immunization: Secondary | ICD-10-CM | POA: Diagnosis not present

## 2021-03-05 NOTE — Progress Notes (Signed)
Pt here for Shingles vaccination.  Shingrix  0.87mL given IM, and pt tolerated injection well.  2nd injection scheduled for 06/04/21.

## 2021-03-29 ENCOUNTER — Encounter: Payer: Self-pay | Admitting: Internal Medicine

## 2021-04-29 DIAGNOSIS — Z1231 Encounter for screening mammogram for malignant neoplasm of breast: Secondary | ICD-10-CM | POA: Diagnosis not present

## 2021-05-13 ENCOUNTER — Encounter: Payer: Self-pay | Admitting: Internal Medicine

## 2021-05-13 NOTE — Progress Notes (Signed)
Outside notes received. Information abstracted. Notes sent to scan.  

## 2021-05-14 ENCOUNTER — Encounter: Payer: Self-pay | Admitting: Internal Medicine

## 2021-05-28 ENCOUNTER — Other Ambulatory Visit (HOSPITAL_COMMUNITY): Payer: Self-pay

## 2021-05-28 ENCOUNTER — Other Ambulatory Visit: Payer: Self-pay | Admitting: Internal Medicine

## 2021-05-28 MED ORDER — LOSARTAN POTASSIUM 50 MG PO TABS
50.0000 mg | ORAL_TABLET | Freq: Every day | ORAL | 3 refills | Status: DC
  Filled 2021-05-28: qty 90, 90d supply, fill #0
  Filled 2021-08-27: qty 90, 90d supply, fill #1
  Filled 2021-12-11: qty 90, 90d supply, fill #2
  Filled 2022-03-08: qty 90, 90d supply, fill #3

## 2021-05-29 ENCOUNTER — Other Ambulatory Visit (HOSPITAL_COMMUNITY): Payer: Self-pay

## 2021-05-31 ENCOUNTER — Other Ambulatory Visit (HOSPITAL_COMMUNITY): Payer: Self-pay

## 2021-06-01 ENCOUNTER — Other Ambulatory Visit (HOSPITAL_COMMUNITY): Payer: Self-pay

## 2021-06-04 ENCOUNTER — Ambulatory Visit (INDEPENDENT_AMBULATORY_CARE_PROVIDER_SITE_OTHER): Payer: 59

## 2021-06-04 DIAGNOSIS — Z23 Encounter for immunization: Secondary | ICD-10-CM

## 2021-06-04 NOTE — Progress Notes (Signed)
Pt is here to get second Shingrix vaccine. This nurse administered Shingrix 0.45m in right deltoid. Patient tolerated injection well with no complaints of discomfort.

## 2021-06-11 ENCOUNTER — Other Ambulatory Visit (HOSPITAL_COMMUNITY): Payer: Self-pay

## 2021-06-11 MED ORDER — ATOVAQUONE-PROGUANIL HCL 250-100 MG PO TABS
ORAL_TABLET | ORAL | 0 refills | Status: DC
  Filled 2021-06-11: qty 25, 25d supply, fill #0

## 2021-06-11 MED ORDER — AZITHROMYCIN 500 MG PO TABS
ORAL_TABLET | ORAL | 0 refills | Status: DC
  Filled 2021-06-11: qty 4, 3d supply, fill #0

## 2021-06-15 ENCOUNTER — Other Ambulatory Visit (HOSPITAL_COMMUNITY): Payer: Self-pay

## 2021-06-16 ENCOUNTER — Other Ambulatory Visit (HOSPITAL_COMMUNITY): Payer: Self-pay

## 2021-07-21 ENCOUNTER — Encounter: Payer: Self-pay | Admitting: Internal Medicine

## 2021-07-21 ENCOUNTER — Other Ambulatory Visit (HOSPITAL_COMMUNITY): Payer: Self-pay

## 2021-07-21 ENCOUNTER — Ambulatory Visit: Payer: 59 | Admitting: Internal Medicine

## 2021-07-21 VITALS — BP 130/86 | HR 66 | Temp 98.7°F | Ht 66.0 in | Wt 194.2 lb

## 2021-07-21 DIAGNOSIS — R7303 Prediabetes: Secondary | ICD-10-CM

## 2021-07-21 DIAGNOSIS — I10 Essential (primary) hypertension: Secondary | ICD-10-CM | POA: Diagnosis not present

## 2021-07-21 DIAGNOSIS — M25561 Pain in right knee: Secondary | ICD-10-CM | POA: Diagnosis not present

## 2021-07-21 MED ORDER — TRAMADOL HCL 50 MG PO TABS
50.0000 mg | ORAL_TABLET | Freq: Four times a day (QID) | ORAL | 0 refills | Status: DC | PRN
Start: 2021-07-21 — End: 2021-10-01
  Filled 2021-07-21: qty 30, 8d supply, fill #0

## 2021-07-21 NOTE — Assessment & Plan Note (Signed)
With moderate pain and swelling more localized over the lateral joint line - ? Meniscal tear vs DJD flare - for tramadol prn, refer sports medicine

## 2021-07-21 NOTE — Progress Notes (Signed)
Patient ID: Mary [name], female   DOB: 1966-01-12, 56 y.o.   MRN: [id number]        Chief Complaint: follow up right knee pain x 2 wks       HPI:  Mary [name] is a 56 y.o. female here with c/o 2 wks onset moderate right knee pain and swelling after flying to [place] and back, with much walking while there, now back for several days and not improving.  Cant recall any injury, also with stiffness worse in the AM, and seems to radiate somewhat distal to the right knee at the right patellar aspect only.  Has some reduced ROM, but giveaways or falls.  No post swelling, calf pain or swelling, other leg swelling and Pt denies chest pain, increased sob or doe, wheezing, orthopnea, PND, increased LE swelling, palpitations, dizziness or syncope.   Pt denies polydipsia, polyuria, or new focal neuro s/s.          Wt Readings from Last 3 Encounters:  07/21/21 194 lb 4 oz (88.1 kg)  01/21/21 190 lb 3.2 oz (86.3 kg)  07/21/20 193 lb (87.5 kg)   BP Readings from Last 3 Encounters:  07/21/21 130/86  01/21/21 132/78  07/21/20 124/84         Past Medical History:  Diagnosis Date   Allergy    Arthritis    Headache(784.0)    Hyperlipidemia    Past Surgical History:  Procedure Laterality Date   CESAREAN SECTION      reports that she has never smoked. She has never used smokeless tobacco. She reports that she does not drink alcohol and does not use drugs. family history includes Migraines in her mother. Allergies  Allergen Reactions   Seasonal Ic [Cholestatin]     Pt states no allergies to medications, unsure of reaction if any   Current Outpatient Medications on File Prior to Visit  Medication Sig Dispense Refill   Aspirin-Acetaminophen-Caffeine (EXCEDRIN MIGRAINE PO) Take by mouth as needed.     atorvastatin (LIPITOR) 10 MG tablet TAKE 1 TABLET BY MOUTH ONCE DAILY 90 tablet 1   atovaquone-proguanil (MALARONE) 250-100 MG TABS tablet Take 1 tab by mouth daily begin 2 days  pre-arrival,take daily during stay and continue for 7 days postravel for malaria prevention 25 tablet 0   azithromycin (ZITHROMAX) 500 MG tablet For nonbloody diarrhea take 2 tabs on day 1,if resolved stop. If diarrhea cont take 1 tab on day 2&3.For bloody diarrhea take 2 tabs on day 1 and 1 tab on days 2&3. 4 tablet 0   Calcium Carbonate-Vitamin D 600-400 MG-UNIT tablet Take 1 tablet by mouth daily.     cyclobenzaprine (FLEXERIL) 10 MG tablet Take 1 tablet by mouth at bedtime. 90 tablet 1   losartan (COZAAR) 50 MG tablet Take 1 tablet (50 mg total) by mouth daily. 90 tablet 3   meloxicam (MOBIC) 15 MG tablet TAKE 1 TABLET BY MOUTH ONCE DAILY AS NEEDED 90 tablet 1   Multiple Vitamin (MULTIVITAMIN) tablet Take 1 tablet by mouth daily.     propranolol (INDERAL) 60 MG tablet TAKE 1 TABLET BY MOUTH TWICE DAILY 180 tablet 1   propranolol (INDERAL) 10 MG tablet Take by mouth.     No current facility-administered medications on file prior to visit.        ROS:  All others reviewed and negative.  Objective        PE:  BP 130/86   Pulse 66   Temp 98.7  F (37.1 C) (Oral)   Ht '5\' 6"'$  (1.676 m)   Wt 194 lb 4 oz (88.1 kg)   SpO2 98%   BMI 31.35 kg/m                 Constitutional: Pt appears in NAD               HENT: Head: NCAT.                Right Ear: External ear normal.                 Left Ear: External ear normal.                Eyes: . Pupils are equal, round, and reactive to light. Conjunctivae and EOM are normal               Nose: without d/c or deformity               Neck: Neck supple. Gross normal ROM               Cardiovascular: Normal rate and regular rhythm.                 Pulmonary/Chest: Effort normal and breath sounds without rales or wheezing.                Right knee with trace diffuse swelling but very warm, tender and 2-3+ swelling more locally over the lateral joint line with reduced ROM               Neurological: Pt is alert. At baseline orientation, motor grossly  intact               Skin: Skin is warm. No rashes, no other new lesions, LE edema - none               Psychiatric: Pt behavior is normal without agitation   Micro: none  Cardiac tracings I have personally interpreted today:  none  Pertinent Radiological findings (summarize): none   Lab Results  Component Value Date   WBC 4.4 07/21/2020   HGB 12.5 07/21/2020   HCT 37.1 07/21/2020   PLT 186.0 07/21/2020   GLUCOSE 92 01/21/2021   CHOL 163 07/21/2020   TRIG 69.0 07/21/2020   HDL 66.60 07/21/2020   LDLDIRECT 127.9 12/05/2011   LDLCALC 83 07/21/2020   ALT 17 01/21/2021   AST 21 01/21/2021   NA 138 01/21/2021   K 4.1 01/21/2021   CL 102 01/21/2021   CREATININE 0.75 01/21/2021   BUN 13 01/21/2021   CO2 32 01/21/2021   TSH 0.51 07/21/2020   HGBA1C 6.2 01/21/2021   Assessment/Plan:  Mary [name] is a 56 y.o. Black or African American [2] female with  has a past medical history of Allergy, Arthritis, Headache(784.0), and Hyperlipidemia.  Knee pain, right With moderate pain and swelling more localized over the lateral joint line - ? Meniscal tear vs DJD flare - for tramadol prn, refer sports medicine  Prediabetes Lab Results  Component Value Date   HGBA1C 6.2 01/21/2021   Stable, pt to continue current medical treatment  - diet, wt control, activity as able   Hypertension BP Readings from Last 3 Encounters:  07/21/21 130/86  01/21/21 132/78  07/21/20 124/84   Stable, pt to continue medical treatment losartan 50 mg qd  Followup: Return if symptoms worsen or fail to improve.  Cathlean Cower, MD 07/21/2021 9:08 PM  [facility]

## 2021-07-21 NOTE — Assessment & Plan Note (Signed)
Lab Results  Component Value Date   HGBA1C 6.2 01/21/2021   Stable, pt to continue current medical treatment  - diet, wt control, activity as able

## 2021-07-21 NOTE — Patient Instructions (Signed)
Please take all new medication as prescribed - the pain medication as needed  Please continue all other medications as before, and refills have been done if requested.  Please have the pharmacy call with any other refills you may need.  Please keep your appointments with your specialists as you may have planned  You will be contacted regarding the referral for: Sports Medicine on the First Floor

## 2021-07-21 NOTE — Assessment & Plan Note (Signed)
BP Readings from Last 3 Encounters:  07/21/21 130/86  01/21/21 132/78  07/21/20 124/84   Stable, pt to continue medical treatment losartan 50 mg qd

## 2021-07-26 NOTE — Progress Notes (Signed)
    Mary [name] D.Mary [name] Phone: [phone]   Assessment and Plan:     There are no diagnoses linked to this encounter.  ***   Pertinent previous records reviewed include ***   Follow Up: ***     Subjective:   I, Mary [name], am serving as a Education administrator for Mary [name]  Chief Complaint: right knee pain   HPI:  07/27/2021 Patient is a 55 year old female complaining of right knee pain. Patient states   Relevant Historical Information: ***  Additional pertinent review of systems negative.   Current Outpatient Medications:    Aspirin-Acetaminophen-Caffeine (EXCEDRIN MIGRAINE PO), Take by mouth as needed., Disp: , Rfl:    atorvastatin (LIPITOR) 10 MG tablet, TAKE 1 TABLET BY MOUTH ONCE DAILY, Disp: 90 tablet, Rfl: 1   atovaquone-proguanil (MALARONE) 250-100 MG TABS tablet, Take 1 tab by mouth daily begin 2 days pre-arrival,take daily during stay and continue for 7 days postravel for malaria prevention, Disp: 25 tablet, Rfl: 0   azithromycin (ZITHROMAX) 500 MG tablet, For nonbloody diarrhea take 2 tabs on day 1,if resolved stop. If diarrhea cont take 1 tab on day 2&3.For bloody diarrhea take 2 tabs on day 1 and 1 tab on days 2&3., Disp: 4 tablet, Rfl: 0   Calcium Carbonate-Vitamin D 600-400 MG-UNIT tablet, Take 1 tablet by mouth daily., Disp: , Rfl:    cyclobenzaprine (FLEXERIL) 10 MG tablet, Take 1 tablet by mouth at bedtime., Disp: 90 tablet, Rfl: 1   losartan (COZAAR) 50 MG tablet, Take 1 tablet (50 mg total) by mouth daily., Disp: 90 tablet, Rfl: 3   meloxicam (MOBIC) 15 MG tablet, TAKE 1 TABLET BY MOUTH ONCE DAILY AS NEEDED, Disp: 90 tablet, Rfl: 1   Multiple Vitamin (MULTIVITAMIN) tablet, Take 1 tablet by mouth daily., Disp: , Rfl:    propranolol (INDERAL) 10 MG tablet, Take by mouth., Disp: , Rfl:    propranolol (INDERAL) 60 MG tablet, TAKE 1 TABLET BY MOUTH TWICE DAILY, Disp: 180  tablet, Rfl: 1   traMADol (ULTRAM) 50 MG tablet, Take 1 tablet (50 mg total) by mouth every 6 (six) hours as needed., Disp: 30 tablet, Rfl: 0   Objective:     There were no vitals filed for this visit.    There is no height or weight on file to calculate BMI.    Physical Exam:    ***   Electronically signed by:  Mary [name] D.[name] 11:34 AM 07/26/21

## 2021-07-27 ENCOUNTER — Ambulatory Visit (INDEPENDENT_AMBULATORY_CARE_PROVIDER_SITE_OTHER): Payer: 59 | Admitting: Sports Medicine

## 2021-07-27 VITALS — BP 132/82 | HR 66 | Ht 66.0 in | Wt 194.0 lb

## 2021-07-27 DIAGNOSIS — M25561 Pain in right knee: Secondary | ICD-10-CM | POA: Diagnosis not present

## 2021-07-27 NOTE — Patient Instructions (Addendum)
Good to see you  Continue meloxicam 15 mg  daily for 2 weeks then use remainder as needed Knee HEP  As needed follow up

## 2021-08-27 ENCOUNTER — Other Ambulatory Visit: Payer: Self-pay | Admitting: Internal Medicine

## 2021-08-27 ENCOUNTER — Other Ambulatory Visit (HOSPITAL_COMMUNITY): Payer: Self-pay

## 2021-08-27 MED ORDER — ATORVASTATIN CALCIUM 10 MG PO TABS
10.0000 mg | ORAL_TABLET | Freq: Every day | ORAL | 0 refills | Status: DC
  Filled 2021-08-27: qty 30, 30d supply, fill #0

## 2021-08-27 MED ORDER — MELOXICAM 15 MG PO TABS
ORAL_TABLET | Freq: Every day | ORAL | 1 refills | Status: DC | PRN
  Filled 2021-08-27: qty 90, 90d supply, fill #0

## 2021-08-27 MED ORDER — CYCLOBENZAPRINE HCL 10 MG PO TABS
10.0000 mg | ORAL_TABLET | Freq: Every day | ORAL | 0 refills | Status: DC
  Filled 2021-08-27: qty 30, 30d supply, fill #0

## 2021-08-30 ENCOUNTER — Other Ambulatory Visit (HOSPITAL_COMMUNITY): Payer: Self-pay

## 2021-09-30 ENCOUNTER — Encounter: Payer: Self-pay | Admitting: Internal Medicine

## 2021-09-30 ENCOUNTER — Encounter: Payer: 59 | Admitting: Internal Medicine

## 2021-09-30 NOTE — Progress Notes (Unsigned)
Subjective:    Patient ID: Mary [name], female    DOB: [birth year], 56 y.o.   MRN: [id number]      HPI Mary [name] is here for a Physical exam.    Was having right knee pain after traveling to [place].  Still hurts.  No pain with sitting.  Pain with getting up and walking.  She did see Dr. Jenny Reichmann and was referred to sports medicine who she also saw.  She was advised that is likely a flare of arthritis and then if it did not improve to return for possible x-ray and injection.  She has been taking meloxicam which is not new.  She is unsure if that has helped or not.  The pain has gotten a little bit better, but she still has it.    Medications and allergies reviewed with patient and updated if appropriate.  Current Outpatient Medications on File Prior to Visit  Medication Sig Dispense Refill   Aspirin-Acetaminophen-Caffeine (EXCEDRIN MIGRAINE PO) Take by mouth as needed.     Calcium Carbonate-Vitamin D 600-400 MG-UNIT tablet Take 1 tablet by mouth daily.     cyclobenzaprine (FLEXERIL) 10 MG tablet Take 1 tablet (10 mg total) by mouth at bedtime. Overdue for Annual appt  must see provider for future refills 30 tablet 0   losartan (COZAAR) 50 MG tablet Take 1 tablet (50 mg total) by mouth daily. 90 tablet 3   meloxicam (MOBIC) 15 MG tablet TAKE 1 TABLET BY MOUTH ONCE DAILY AS NEEDED 90 tablet 1   Multiple Vitamin (MULTIVITAMIN) tablet Take 1 tablet by mouth daily.     propranolol (INDERAL) 60 MG tablet TAKE 1 TABLET BY MOUTH TWICE DAILY 180 tablet 1   atorvastatin (LIPITOR) 10 MG tablet Take 1 tablet (10 mg total) by mouth daily. Overdue for Annual appt w/labs must see provider for future refills 30 tablet 0   No current facility-administered medications on file prior to visit.    Review of Systems  Constitutional:  Negative for chills and fever.  Eyes:  Negative for visual disturbance.  Respiratory:  Negative for cough, shortness of breath and wheezing.   Cardiovascular:  Negative  for chest pain, palpitations and leg swelling.  Gastrointestinal:  Positive for constipation. Negative for abdominal pain, blood in stool, diarrhea and nausea.       Gerd occ  Genitourinary:  Negative for dysuria.  Musculoskeletal:  Positive for arthralgias and back pain.  Skin:  Negative for rash.  Neurological:  Positive for headaches (occ). Negative for light-headedness.  Psychiatric/Behavioral:  Negative for dysphoric mood. The patient is not nervous/anxious.        Objective:   Vitals:   10/01/21 0949  BP: 132/84  Pulse: 62  Temp: 97.9 F (36.6 C)  SpO2: 98%   Filed Weights   10/01/21 0949  Weight: 192 lb 6.4 oz (87.3 kg)   Body mass index is 31.05 kg/m.  BP Readings from Last 3 Encounters:  10/01/21 132/84  07/27/21 132/82  07/21/21 130/86    Wt Readings from Last 3 Encounters:  10/01/21 192 lb 6.4 oz (87.3 kg)  07/27/21 194 lb (88 kg)  07/21/21 194 lb 4 oz (88.1 kg)       Physical Exam Constitutional: She appears well-developed and well-nourished. No distress.  HENT:  Head: Normocephalic and atraumatic.  Right Ear: External ear normal. Normal ear canal and TM Left Ear: External ear normal.  Normal ear canal and TM Mouth/Throat: Oropharynx is clear and moist.  Eyes: Conjunctivae normal.  Neck: Neck supple. No tracheal deviation present. No thyromegaly present.  No carotid bruit  Cardiovascular: Normal rate, regular rhythm and normal heart sounds.   No murmur heard.  No edema. Pulmonary/Chest: Effort normal and breath sounds normal. No respiratory distress. She has no wheezes. She has no rales.  Breast: deferred   Abdominal: Soft. She exhibits no distension. There is no tenderness.  Lymphadenopathy: She has no cervical adenopathy.  Skin: Skin is warm and dry. She is not diaphoretic.  Psychiatric: She has a normal mood and affect. Her behavior is normal.     Lab Results  Component Value Date   WBC 4.4 07/21/2020   HGB 12.5 07/21/2020   HCT 37.1  07/21/2020   PLT 186.0 07/21/2020   GLUCOSE 92 01/21/2021   CHOL 163 07/21/2020   TRIG 69.0 07/21/2020   HDL 66.60 07/21/2020   LDLDIRECT 127.9 12/05/2011   LDLCALC 83 07/21/2020   ALT 17 01/21/2021   AST 21 01/21/2021   NA 138 01/21/2021   K 4.1 01/21/2021   CL 102 01/21/2021   CREATININE 0.75 01/21/2021   BUN 13 01/21/2021   CO2 32 01/21/2021   TSH 0.51 07/21/2020   HGBA1C 6.2 01/21/2021         Assessment & Plan:   Physical exam: Screening blood work  ordered Exercise  none-encouraged regular exercise Weight  encouraged weight loss Substance abuse  none   Reviewed recommended immunizations.  Will get flu vaccine at work.    Health Maintenance  Topic Date Due   COVID-19 Vaccine (4 - Pfizer series) 02/14/2020   MAMMOGRAM  04/21/2021   INFLUENZA VACCINE  08/17/2021   TETANUS/TDAP  04/10/2023   PAP SMEAR-Modifier  04/22/2023   COLONOSCOPY (Pts 45-27yr Insurance coverage will need to be confirmed)  08/29/2023   Hepatitis C Screening  Completed   HIV Screening  Completed   Zoster Vaccines- Shingrix  Completed   HPV VACCINES  Aged Out          See Problem List for Assessment and Plan of chronic medical problems.

## 2021-09-30 NOTE — Patient Instructions (Addendum)
Blood work was ordered.     Medications changes include :      Your prescription(s) have been sent to your pharmacy.    A referral was ordered for XX.     Someone from that office will call you to schedule an appointment.    Return in about 6 months (around 04/01/2022) for follow up.   Health Maintenance, Female Adopting a healthy lifestyle and getting preventive care are important in promoting health and wellness. Ask your health care provider about: The right schedule for you to have regular tests and exams. Things you can do on your own to prevent diseases and keep yourself healthy. What should I know about diet, weight, and exercise? Eat a healthy diet  Eat a diet that includes plenty of vegetables, fruits, low-fat dairy products, and lean protein. Do not eat a lot of foods that are high in solid fats, added sugars, or sodium. Maintain a healthy weight Body mass index (BMI) is used to identify weight problems. It estimates body fat based on height and weight. Your health care provider can help determine your BMI and help you achieve or maintain a healthy weight. Get regular exercise Get regular exercise. This is one of the most important things you can do for your health. Most adults should: Exercise for at least 150 minutes each week. The exercise should increase your heart rate and make you sweat (moderate-intensity exercise). Do strengthening exercises at least twice a week. This is in addition to the moderate-intensity exercise. Spend less time sitting. Even light physical activity can be beneficial. Watch cholesterol and blood lipids Have your blood tested for lipids and cholesterol at 54 years of age, then have this test every 5 years. Have your cholesterol levels checked more often if: Your lipid or cholesterol levels are high. You are older than 54 years of age. You are at high risk for heart disease. What should I know about cancer screening? Depending on  your health history and family history, you may need to have cancer screening at various ages. This may include screening for: Breast cancer. Cervical cancer. Colorectal cancer. Skin cancer. Lung cancer. What should I know about heart disease, diabetes, and high blood pressure? Blood pressure and heart disease High blood pressure causes heart disease and increases the risk of stroke. This is more likely to develop in people who have high blood pressure readings or are overweight. Have your blood pressure checked: Every 3-5 years if you are 36-108 years of age. Every year if you are 86 years old or older. Diabetes Have regular diabetes screenings. This checks your fasting blood sugar level. Have the screening done: Once every three years after age 60 if you are at a normal weight and have a low risk for diabetes. More often and at a younger age if you are overweight or have a high risk for diabetes. What should I know about preventing infection? Hepatitis B If you have a higher risk for hepatitis B, you should be screened for this virus. Talk with your health care provider to find out if you are at risk for hepatitis B infection. Hepatitis C Testing is recommended for: Everyone born from [birth year] through 1963. Anyone with known risk factors for hepatitis C. Sexually transmitted infections (STIs) Get screened for STIs, including gonorrhea and chlamydia, if: You are sexually active and are younger than 54 years of age. You are older than 54 years of age and your health care provider tells you  that you are at risk for this type of infection. Your sexual activity has changed since you were last screened, and you are at increased risk for chlamydia or gonorrhea. Ask your health care provider if you are at risk. Ask your health care provider about whether you are at high risk for HIV. Your health care provider may recommend a prescription medicine to help prevent HIV infection. If you choose to take  medicine to prevent HIV, you should first get tested for HIV. You should then be tested every 3 months for as long as you are taking the medicine. Pregnancy If you are about to stop having your period (premenopausal) and you may become pregnant, seek counseling before you get pregnant. Take 400 to 800 micrograms (mcg) of folic acid every day if you become pregnant. Ask for birth control (contraception) if you want to prevent pregnancy. Osteoporosis and menopause Osteoporosis is a disease in which the bones lose minerals and strength with aging. This can result in bone fractures. If you are 6 years old or older, or if you are at risk for osteoporosis and fractures, ask your health care provider if you should: Be screened for bone loss. Take a calcium or vitamin D supplement to lower your risk of fractures. Be given hormone replacement therapy (HRT) to treat symptoms of menopause. Follow these instructions at home: Alcohol use Do not drink alcohol if: Your health care provider tells you not to drink. You are pregnant, may be pregnant, or are planning to become pregnant. If you drink alcohol: Limit how much you have to: 0-1 drink a day. Know how much alcohol is in your drink. In the [place] one drink equals one 12 oz bottle of beer (355 mL), one 5 oz glass of wine (148 mL), or one 1 oz glass of hard liquor (44 mL). Lifestyle Do not use any products that contain nicotine or tobacco. These products include cigarettes, chewing tobacco, and vaping devices, such as e-cigarettes. If you need help quitting, ask your health care provider. Do not use street drugs. Do not share needles. Ask your health care provider for help if you need support or information about quitting drugs. General instructions Schedule regular health, dental, and eye exams. Stay current with your vaccines. Tell your health care provider if: You often feel depressed. You have ever been abused or do not feel safe at  home. Summary Adopting a healthy lifestyle and getting preventive care are important in promoting health and wellness. Follow your health care provider's instructions about healthy diet, exercising, and getting tested or screened for diseases. Follow your health care provider's instructions on monitoring your cholesterol and blood pressure. This information is not intended to replace advice given to you by your health care provider. Make sure you discuss any questions you have with your health care provider. Document Revised: 05/25/2020 Document Reviewed: 05/25/2020 Elsevier Patient Education  [place].

## 2021-10-01 ENCOUNTER — Other Ambulatory Visit (HOSPITAL_COMMUNITY): Payer: Self-pay

## 2021-10-01 ENCOUNTER — Ambulatory Visit (INDEPENDENT_AMBULATORY_CARE_PROVIDER_SITE_OTHER): Payer: 59

## 2021-10-01 ENCOUNTER — Ambulatory Visit (INDEPENDENT_AMBULATORY_CARE_PROVIDER_SITE_OTHER): Payer: 59 | Admitting: Internal Medicine

## 2021-10-01 VITALS — BP 132/84 | HR 62 | Temp 97.9°F | Ht 66.0 in | Wt 192.4 lb

## 2021-10-01 DIAGNOSIS — M25561 Pain in right knee: Secondary | ICD-10-CM

## 2021-10-01 DIAGNOSIS — I10 Essential (primary) hypertension: Secondary | ICD-10-CM | POA: Diagnosis not present

## 2021-10-01 DIAGNOSIS — E010 Iodine-deficiency related diffuse (endemic) goiter: Secondary | ICD-10-CM

## 2021-10-01 DIAGNOSIS — G8929 Other chronic pain: Secondary | ICD-10-CM

## 2021-10-01 DIAGNOSIS — Z0000 Encounter for general adult medical examination without abnormal findings: Secondary | ICD-10-CM

## 2021-10-01 DIAGNOSIS — E782 Mixed hyperlipidemia: Secondary | ICD-10-CM

## 2021-10-01 DIAGNOSIS — R7303 Prediabetes: Secondary | ICD-10-CM

## 2021-10-01 DIAGNOSIS — M545 Low back pain, unspecified: Secondary | ICD-10-CM | POA: Diagnosis not present

## 2021-10-01 DIAGNOSIS — G43009 Migraine without aura, not intractable, without status migrainosus: Secondary | ICD-10-CM

## 2021-10-01 DIAGNOSIS — E6609 Other obesity due to excess calories: Secondary | ICD-10-CM

## 2021-10-01 DIAGNOSIS — Z6831 Body mass index (BMI) 31.0-31.9, adult: Secondary | ICD-10-CM

## 2021-10-01 LAB — CBC WITH DIFFERENTIAL/PLATELET
Basophils Absolute: 0 10*3/uL (ref 0.0–0.1)
Basophils Relative: 0.6 % (ref 0.0–3.0)
Eosinophils Absolute: 0.2 10*3/uL (ref 0.0–0.7)
Eosinophils Relative: 4.4 % (ref 0.0–5.0)
HCT: 37.6 % (ref 36.0–46.0)
Hemoglobin: 12.4 g/dL (ref 12.0–15.0)
Lymphocytes Relative: 42 % (ref 12.0–46.0)
Lymphs Abs: 1.7 10*3/uL (ref 0.7–4.0)
MCHC: 33.1 g/dL (ref 30.0–36.0)
MCV: 91.4 fl (ref 78.0–100.0)
Monocytes Absolute: 0.4 10*3/uL (ref 0.1–1.0)
Monocytes Relative: 8.6 % (ref 3.0–12.0)
Neutro Abs: 1.8 10*3/uL (ref 1.4–7.7)
Neutrophils Relative %: 44.4 % (ref 43.0–77.0)
Platelets: 183 10*3/uL (ref 150.0–400.0)
RBC: 4.12 Mil/uL (ref 3.87–5.11)
RDW: 14.4 % (ref 11.5–15.5)
WBC: 4.1 10*3/uL (ref 4.0–10.5)

## 2021-10-01 LAB — LIPID PANEL
Cholesterol: 163 mg/dL (ref 0–200)
HDL: 70.2 mg/dL (ref >39.00)
LDL Cholesterol: 82 mg/dL (ref 0–99)
NonHDL: 92.67
Total CHOL/HDL Ratio: 2
Triglycerides: 51 mg/dL (ref 0.0–149.0)
VLDL: 10.2 mg/dL (ref 0.0–40.0)

## 2021-10-01 LAB — COMPREHENSIVE METABOLIC PANEL
ALT: 16 U/L (ref 0–35)
AST: 21 U/L (ref 0–37)
Albumin: 3.9 g/dL (ref 3.5–5.2)
Alkaline Phosphatase: 83 U/L (ref 39–117)
BUN: 12 mg/dL (ref 6–23)
CO2: 30 mEq/L (ref 19–32)
Calcium: 9.4 mg/dL (ref 8.4–10.5)
Chloride: 104 mEq/L (ref 96–112)
Creatinine, Ser: 0.83 mg/dL (ref 0.40–1.20)
GFR: 80.03 mL/min (ref >60.00)
Glucose, Bld: 90 mg/dL (ref 70–99)
Potassium: 4 mEq/L (ref 3.5–5.1)
Sodium: 139 mEq/L (ref 135–145)
Total Bilirubin: 1 mg/dL (ref 0.2–1.2)
Total Protein: 7.6 g/dL (ref 6.0–8.3)

## 2021-10-01 LAB — TSH: TSH: 0.68 u[IU]/mL (ref 0.35–5.50)

## 2021-10-01 LAB — HEMOGLOBIN A1C: Hgb A1c MFr Bld: 6.2 % (ref 4.6–6.5)

## 2021-10-01 MED ORDER — CYCLOBENZAPRINE HCL 10 MG PO TABS
10.0000 mg | ORAL_TABLET | Freq: Every day | ORAL | 1 refills | Status: DC
  Filled 2021-10-01: qty 90, 90d supply, fill #0

## 2021-10-01 MED ORDER — ATORVASTATIN CALCIUM 10 MG PO TABS
10.0000 mg | ORAL_TABLET | Freq: Every day | ORAL | 0 refills | Status: DC
  Filled 2021-10-01: qty 90, 90d supply, fill #0

## 2021-10-01 NOTE — Assessment & Plan Note (Signed)
Chronic Stable TSH

## 2021-10-01 NOTE — Assessment & Plan Note (Signed)
Chronic Regular exercise and healthy diet encouraged Check lipid panel  Continue atorvastatin 10 mg daily 

## 2021-10-01 NOTE — Assessment & Plan Note (Signed)
Chronic Blood pressure well controlled CMP Continue losartan 50 mg daily 

## 2021-10-01 NOTE — Assessment & Plan Note (Signed)
Chronic Check a1c Low sugar / carb diet Stressed regular exercise  

## 2021-10-01 NOTE — Assessment & Plan Note (Signed)
Knee pain started just over 2 months ago after returning from a trip to [place] Possible OA flare On meloxicam 15 mg daily chronically Given short-term tramadol Saw sports medicine Pain has not improved with time We will get x-ray today Discussed returning to see sports medicine depending on x-ray results or persistent pain

## 2021-10-01 NOTE — Assessment & Plan Note (Addendum)
Chronic Intermittent Continue meloxicam 15 mg daily as needed only Continue Flexeril 10 mg nighttime as needed  She is taking the medication both medications nightly.  Discussed concerns with long-term meloxicam use and encouraged her to try just taking as needed so that she can continue to use it as needed Discussed that it may be a good idea to do more back exercises and see a specialist to see if there is other things that can be done so that she is not continuously taking the medication

## 2021-10-01 NOTE — Assessment & Plan Note (Signed)
Chronic Taking propranolol daily for prevention, increases to twice daily if she has headaches Taking Excedrin Migraine as needed Continue propranolol 60 mg daily-twice daily as needed

## 2021-10-01 NOTE — Assessment & Plan Note (Signed)
Chronic Encouraged weight loss Discussed importance of regular exercise Discussed smaller portions, decrease calorie intake, diet rich in vegetables, fruits, whole grain fiber, beans and lower in carbohydrates and sugars

## 2021-10-05 ENCOUNTER — Other Ambulatory Visit (HOSPITAL_COMMUNITY): Payer: Self-pay

## 2021-10-18 NOTE — Progress Notes (Signed)
Benito Mccreedy D.[place] Phone: [phone]   Assessment and Plan:     1. Chronic pain of right knee 2. Primary osteoarthritis of right knee  -Chronic with exacerbation, subsequent visit - Continued right knee pain that worsened after discontinuation of meloxicam, consistent with osteoarthritis of right knee - Reviewed patient's x-ray with patient in room.  My interpretation: No acute fracture or dislocation.  Mildly decreased joint space and medial compartment as well as cortical changes over patella. -Discontinue meloxicam - Patient elected for intra-articular CSI.  Tolerated well per note below - Continue HEP  Procedure: Knee Joint Injection Side: Right Indication: Flare of osteoarthritis  Risks explained and consent was given verbally. The site was cleaned with alcohol prep. A needle was introduced with an anterio-lateral approach. Injection given using 76m of 1% lidocaine without epinephrine and 155mof kenalog '40mg'$ /ml. This was well tolerated and resulted in symptomatic relief.  Needle was removed, hemostasis achieved, and post injection instructions were explained.   Pt was advised to call or return to clinic if these symptoms worsen or fail to improve as anticipated.    Pertinent previous records reviewed include right knee x-ray 10/01/2021, internal medicine note 10/01/2021   Follow Up: 3 weeks for reevaluation.  Could consider advanced imaging versus HA injection if no improvement or worsening of symptoms   Subjective:   I, [name], am serving as a scEducation administratoror Doctor BeGlennon Mac Chief Complaint: right knee pain    HPI:  07/27/2021 Patient is a [age]ear old female complaining of right knee pain. [place] her knee was hurting its been going for about 3 weeks , stiffness  when she bends or tries to get up , but no numbness  or tingling , does get locking clicking and popping, has been taking tylenol for the pain and that is not been helping , anterior knee pain , sometime radiating pain to the lateral leg but not always    10/19/2021 Patient states that her knee is still hurting, same pain as when she was here 07/27/2021 has taken the meloxicam in the past 2 weeks    Relevant Historical Information: Prediabetes, hypertension  Additional pertinent review of systems negative.   Current Outpatient Medications:    Aspirin-Acetaminophen-Caffeine (EXCEDRIN MIGRAINE PO), Take by mouth as needed., Disp: , Rfl:    atorvastatin (LIPITOR) 10 MG tablet, Take 1 tablet (10 mg total) by mouth daily. Overdue for Annual appt w/labs must see provider for future refills, Disp: 90 tablet, Rfl: 0   Calcium Carbonate-Vitamin D 600-400 MG-UNIT tablet, Take 1 tablet by mouth daily., Disp: , Rfl:    cyclobenzaprine (FLEXERIL) 10 MG tablet, Take 1 tablet (10 mg total) by mouth at bedtime., Disp: 90 tablet, Rfl: 1   losartan (COZAAR) 50 MG tablet, Take 1 tablet (50 mg total) by mouth daily., Disp: 90 tablet, Rfl: 3   meloxicam (MOBIC) 15 MG tablet, TAKE 1 TABLET BY MOUTH ONCE DAILY AS NEEDED, Disp: 90 tablet, Rfl: 1   Multiple Vitamin (MULTIVITAMIN) tablet, Take 1 tablet by mouth daily., Disp: , Rfl:    propranolol (INDERAL) 60 MG tablet, TAKE 1 TABLET BY MOUTH TWICE DAILY, Disp: 180 tablet, Rfl: 1   Objective:     Vitals:   10/19/21 0954  BP: 124/82  Pulse: 61  SpO2: 100%  Weight: 192 lb (  87.1 kg)  Height: '5\' 6"'$  (1.676 m)      Body mass index is 30.99 kg/m.    Physical Exam:    General:  awake, alert oriented, no acute distress nontoxic Skin: no suspicious lesions or rashes Neuro:sensation intact, no deficits, strength 5/5 with no deficits, no atrophy, normal muscle tone Psych: No signs of anxiety, depression or other mood disorder   Right knee: Mild swelling No deformity Neg fluid wave, joint milking ROM Flex 110 ,  Ext 0 Patellar crepitus TTP lateral joint line, lateral patella NTTP over the quad tendon, medial fem condyle, lat fem condyle, patellar body, plica, patella tendon, tibial tuberostiy, fibular head, posterior fossa, pes anserine bursa, gerdy's tubercle, medial jt line,   Neg anterior and posterior drawer Neg lachman Neg sag sign Negative varus stress Negative valgus stress Negative McMurray Positive Thessaly   Gait normal    Electronically signed by:  Benito Mccreedy D.[facility] 10:06 AM 10/19/21

## 2021-10-19 ENCOUNTER — Ambulatory Visit (INDEPENDENT_AMBULATORY_CARE_PROVIDER_SITE_OTHER): Payer: 59 | Admitting: Sports Medicine

## 2021-10-19 VITALS — BP 124/82 | HR 61 | Ht 66.0 in | Wt 192.0 lb

## 2021-10-19 DIAGNOSIS — G8929 Other chronic pain: Secondary | ICD-10-CM

## 2021-10-19 DIAGNOSIS — M1711 Unilateral primary osteoarthritis, right knee: Secondary | ICD-10-CM | POA: Diagnosis not present

## 2021-10-19 NOTE — Patient Instructions (Addendum)
Good to see you 3 week follow up  

## 2021-10-20 ENCOUNTER — Encounter: Payer: Self-pay | Admitting: Internal Medicine

## 2021-11-08 NOTE — Progress Notes (Signed)
Mary [name] Mary [name] Phone: [phone]   Assessment and Plan:     1. 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  My interpretation: No acute fracture or vertebral collapse.  Decrease of lumbar lordosis. - Patient has used courses of NSAIDs on multiple occasions throughout the years without significant improvement in symptoms - Recommend HEP for low back and core strengthening.  I recommend PT as well, however patient completed this 1 to 2 years ago and did not notice significant benefit and states that it was expensive, so she declines at this time - May use Tylenol/NSAIDs as needed -Patient elected for methylprednisone 80 mg, Toradol 60 mg IM injection.  Provided at today's visit  2. Chronic pain of right knee 3. Primary osteoarthritis of right knee  -Chronic with exacerbation, subsequent sports medicine visit - Patient received only about 2 weeks relief from intra-articular knee CSI at office visit on 10/19/2021 - Discussed additional injection options including HA, Zilretta, PRP with patient.  Patient elects for HA injection.  We will obtain prior authorization and patient can follow-up in 1 week for procedure only HA injection  Pertinent previous records reviewed include none   Follow Up:  We will obtain prior authorization and patient can follow-up in 1 week for procedure only HA injection.  Patient can then follow-up 2 to 3 weeks after HA injection to review relief from injection and back pain   Subjective:   I, Mary [name], am serving as a Education administrator for Doctor Mary [name]   Chief Complaint: right knee pain    HPI:  07/27/2021 Patient is a 56 year old female complaining of right knee pain. Patient states that  she went on a trip to [place] went she got off the plane her knee was hurting its been going for about 3 weeks , stiffness  when she bends or tries to get up , but no numbness or tingling , does get locking clicking and popping, has been taking tylenol for the pain and that is not been helping , anterior knee pain , sometime radiating pain to the lateral leg but not always    10/19/2021 Patient states that her knee is still hurting, same pain as when she was here 07/27/2021 has taken the meloxicam in the past 2 weeks    11/09/2021 Patient states that she is doing better , pain has come back slightly   Relevant Historical Information: Prediabetes, hypertension Additional pertinent review of systems negative.   Current Outpatient Medications:    Aspirin-Acetaminophen-Caffeine (EXCEDRIN MIGRAINE PO), Take by mouth as needed., Disp: , Rfl:    atorvastatin (LIPITOR) 10 MG tablet, Take 1 tablet (10 mg total) by mouth daily. Overdue for Annual appt w/labs must see provider for future refills, Disp: 90 tablet, Rfl: 0   Calcium Carbonate-Vitamin D 600-400 MG-UNIT tablet, Take 1 tablet by mouth daily., Disp: , Rfl:    cyclobenzaprine (FLEXERIL) 10 MG tablet, Take 1 tablet (10 mg total) by mouth at bedtime., Disp: 90 tablet, Rfl: 1   losartan (COZAAR) 50 MG tablet, Take 1 tablet (50 mg total) by mouth daily., Disp: 90 tablet, Rfl: 3   meloxicam (MOBIC) 15 MG tablet, TAKE 1 TABLET BY MOUTH ONCE DAILY AS NEEDED, Disp: 90 tablet, Rfl: 1   Multiple Vitamin (MULTIVITAMIN)  tablet, Take 1 tablet by mouth daily., Disp: , Rfl:    propranolol (INDERAL) 60 MG tablet, TAKE 1 TABLET BY MOUTH TWICE DAILY, Disp: 180 tablet, Rfl: 1   Objective:     Vitals:   11/09/21 0900  BP: 132/68  Pulse: 61  SpO2: 100%  Weight: 197 lb (89.4 kg)  Height: '5\' 6"'$  (1.676 m)      Body mass index is 31.8 kg/m.    Physical Exam:    Gen: Appears well, nad, nontoxic and pleasant Psych: Alert and oriented, appropriate mood and  affect Neuro: sensation intact, strength is 5/5 in upper and lower extremities, muscle tone wnl Skin: no susupicious lesions or rashes  Back - Normal skin, Spine with normal alignment and no deformity.   No tenderness to vertebral process palpation.   Paraspinous muscles are mildly tender and without spasm Decreased ROM of low back flexion, extension, rotation due to generalized tenderness NTTP gluteal musculature Straight leg raise negative   Piriformis Test negative    Electronically signed by:  Mary [name] D.[name] 9:41 AM 11/09/21

## 2021-11-09 ENCOUNTER — Ambulatory Visit (INDEPENDENT_AMBULATORY_CARE_PROVIDER_SITE_OTHER): Payer: 59 | Admitting: Sports Medicine

## 2021-11-09 ENCOUNTER — Ambulatory Visit (INDEPENDENT_AMBULATORY_CARE_PROVIDER_SITE_OTHER): Payer: 59

## 2021-11-09 VITALS — BP 132/68 | HR 61 | Ht 66.0 in | Wt 197.0 lb

## 2021-11-09 DIAGNOSIS — M545 Low back pain, unspecified: Secondary | ICD-10-CM

## 2021-11-09 DIAGNOSIS — M25561 Pain in right knee: Secondary | ICD-10-CM

## 2021-11-09 DIAGNOSIS — M1711 Unilateral primary osteoarthritis, right knee: Secondary | ICD-10-CM

## 2021-11-09 DIAGNOSIS — G8929 Other chronic pain: Secondary | ICD-10-CM | POA: Diagnosis not present

## 2021-11-09 MED ORDER — METHYLPREDNISOLONE ACETATE 80 MG/ML IJ SUSP
80.0000 mg | Freq: Once | INTRAMUSCULAR | Status: AC
  Administered 2021-11-09: 80 mg via INTRAMUSCULAR

## 2021-11-09 MED ORDER — KETOROLAC TROMETHAMINE 60 MG/2ML IM SOLN
60.0000 mg | Freq: Once | INTRAMUSCULAR | Status: AC
  Administered 2021-11-09: 60 mg via INTRAMUSCULAR

## 2021-11-09 NOTE — Patient Instructions (Addendum)
Good to see you  Low back HEP  HA injection follow up in 1 week

## 2021-11-09 NOTE — Addendum Note (Signed)
Addended by: Douglass Rivers T on: 11/09/2021 09:46 AM   Modules accepted: Orders

## 2021-11-11 ENCOUNTER — Encounter: Payer: Self-pay | Admitting: Sports Medicine

## 2021-11-15 NOTE — Progress Notes (Signed)
Mary [name] D.[facility] [address] [place] [address] Phone: [phone]   Assessment and Plan:    1. Chronic pain of right knee 2. Primary osteoarthritis of right knee -Chronic with exacerbation, subsequent visit - Patient presents today for procedure only HA injection.  Tolerated well per note below - Continue HEP - Patient received only about 2 weeks relief from intra-articular CSI on 10/19/2021  Other orders - Sodium Hyaluronate (DUROLANE) intra-articular injection 60 mg    Procedure: Knee Joint Injection Side: Right Indication: Osteoarthritis with flare  Risks explained and consent was given verbally. The site was cleaned with alcohol prep. A needle was introduced with an anterio-lateral approach. Injection given using Durolane 60 mg / 3 mL. This was well tolerated and resulted in symptomatic relief.  Needle was removed, hemostasis achieved, and post injection instructions were explained.   Pt was advised to call or return to clinic if these symptoms worsen or fail to improve as anticipated.    Pertinent previous records reviewed include none   Follow Up: 2 to 3 weeks for reevaluation of right knee as well as low back pain.  Could consider physical therapy versus advanced imaging based on symptoms   Subjective:   I, Mary [name], am serving as a Neurosurgeon for [name]   Chief Complaint: right knee pain    HPI:  07/27/2021 Patient is a 56 year old female complaining of right knee pain. Patient states that she went on a trip to [place] went she got off the plane her knee was hurting its been going for about 3 weeks , stiffness  when she bends or tries to get up , but no numbness or tingling , does get locking clicking and popping, has been taking tylenol for the pain and that is not been helping , anterior knee pain , sometime radiating pain to the lateral leg but not always    10/19/2021 Patient states that her knee is still  hurting, same pain as when she was here 07/27/2021 has taken the meloxicam in the past 2 weeks    11/09/2021 Patient states that she is doing better , pain has come back slightly   11/16/2021 Patient states ready for HA today    Relevant Historical Information: Prediabetes, hypertension Additional pertinent review of systems negative  Additional pertinent review of systems negative.  Current Outpatient Medications  Medication Sig Dispense Refill   Aspirin-Acetaminophen-Caffeine (EXCEDRIN MIGRAINE PO) Take by mouth as needed.     atorvastatin (LIPITOR) 10 MG tablet Take 1 tablet (10 mg total) by mouth daily. Overdue for Annual appt w/labs must see provider for future refills 90 tablet 0   Calcium Carbonate-Vitamin D 600-400 MG-UNIT tablet Take 1 tablet by mouth daily.     cyclobenzaprine (FLEXERIL) 10 MG tablet Take 1 tablet (10 mg total) by mouth at bedtime. 90 tablet 1   losartan (COZAAR) 50 MG tablet Take 1 tablet (50 mg total) by mouth daily. 90 tablet 3   meloxicam (MOBIC) 15 MG tablet TAKE 1 TABLET BY MOUTH ONCE DAILY AS NEEDED 90 tablet 1   Multiple Vitamin (MULTIVITAMIN) tablet Take 1 tablet by mouth daily.     propranolol (INDERAL) 60 MG tablet TAKE 1 TABLET BY MOUTH TWICE DAILY 180 tablet 1   No current facility-administered medications for this visit.      Objective:     Vitals:   11/16/21 0916  Weight: 197 lb (89.4 kg)  Height: 5\' 6"  (1.676  m)      Body mass index is 31.8 kg/m.   Electronically signed by:  Mary [name] D.[facility] 9:36 AM 11/16/21

## 2021-11-16 ENCOUNTER — Ambulatory Visit (INDEPENDENT_AMBULATORY_CARE_PROVIDER_SITE_OTHER): Payer: 59 | Admitting: Sports Medicine

## 2021-11-16 VITALS — Ht 66.0 in | Wt 197.0 lb

## 2021-11-16 DIAGNOSIS — G8929 Other chronic pain: Secondary | ICD-10-CM | POA: Diagnosis not present

## 2021-11-16 DIAGNOSIS — M25561 Pain in right knee: Secondary | ICD-10-CM

## 2021-11-16 DIAGNOSIS — M1711 Unilateral primary osteoarthritis, right knee: Secondary | ICD-10-CM

## 2021-11-16 MED ORDER — SODIUM HYALURONATE 60 MG/3ML IX PRSY
60.0000 mg | PREFILLED_SYRINGE | Freq: Once | INTRA_ARTICULAR | Status: AC
  Administered 2021-11-16: 60 mg via INTRA_ARTICULAR

## 2021-11-16 NOTE — Patient Instructions (Addendum)
Good to see you  2-3 week follow up   

## 2021-12-08 NOTE — Progress Notes (Signed)
Benito Mccreedy D.[facility] [place] Phone: [phone]   Assessment and Plan:     1. Chronic pain of right knee 2. Primary osteoarthritis of right knee - Chronic with exacerbation, subsequent visit - Significant improvement in right knee pain after HA injection on 11/16/2021.  Patient states that the HA injection was significantly painful and she is not sure if she would want to have it again even with the pain relief that she is currently having.  We could instead discuss Zilretta injections since CSI only provided mild and temporary relief.  Alternatively, we could consider a 3 series HA injection to see if smaller volume injections would be better tolerated by patient - Continue HEP - Continue Tylenol as needed for pain relief  3. Chronic bilateral low back pain without sciatica  -Chronic with exacerbation - Overall improvement in low back pain with HEP methylprednisone/Toradol injection, though patient continues to have some soreness around the injection site - Recommend using Voltaren gel topically over injection site soreness  Pertinent previous records reviewed include none   Follow Up: 6 months for reevaluation or sooner if flare of pain.  Could consider injections per note above   Subjective:   I, [name], am serving as a Education administrator for Doctor Glennon Mac   Chief Complaint: right knee pain    HPI:  07/27/2021 Patient is a 56 year old female complaining of right knee pain. Patient states that she went on a trip to [place] went she got off the plane her knee was hurting its been going for about 3 weeks , stiffness  when she bends or tries to get up , but no numbness or tingling , does get locking clicking and popping, has been taking tylenol for the pain and that is not been helping , anterior knee pain , sometime radiating pain to the lateral leg but not always    10/19/2021 Patient states that her knee  is still hurting, same pain as when she was here 07/27/2021 has taken the meloxicam in the past 2 weeks    11/09/2021 Patient states that she is doing better , pain has come back slightly   11/16/2021 Patient states ready for HA today   12/13/2021 Patient states that she is good     Relevant Historical Information: Prediabetes, hypertension Additional pertinent review of systems negative  Additional pertinent review of systems negative.   Current Outpatient Medications:    Aspirin-Acetaminophen-Caffeine (EXCEDRIN MIGRAINE PO), Take by mouth as needed., Disp: , Rfl:    atorvastatin (LIPITOR) 10 MG tablet, Take 1 tablet (10 mg total) by mouth daily. Overdue for Annual appt w/labs must see provider for future refills, Disp: 90 tablet, Rfl: 0   Calcium Carbonate-Vitamin D 600-400 MG-UNIT tablet, Take 1 tablet by mouth daily., Disp: , Rfl:    cyclobenzaprine (FLEXERIL) 10 MG tablet, Take 1 tablet (10 mg total) by mouth at bedtime., Disp: 90 tablet, Rfl: 1   losartan (COZAAR) 50 MG tablet, Take 1 tablet (50 mg total) by mouth daily., Disp: 90 tablet, Rfl: 3   meloxicam (MOBIC) 15 MG tablet, TAKE 1 TABLET BY MOUTH ONCE DAILY AS NEEDED, Disp: 90 tablet, Rfl: 1   Multiple Vitamin (MULTIVITAMIN) tablet, Take 1 tablet by mouth daily., Disp: , Rfl:    propranolol (INDERAL) 60 MG tablet, TAKE 1 TABLET BY MOUTH TWICE DAILY, Disp: 180 tablet, Rfl: 1   Objective:     Vitals:   12/13/21  0913  Pulse: 84  SpO2: 99%  Weight: 192 lb (87.1 kg)  Height: '5\' 6"'$  (1.676 m)      Body mass index is 30.99 kg/m.    Physical Exam:    General:  awake, alert oriented, no acute distress nontoxic Skin: no suspicious lesions or rashes Neuro:sensation intact and strength 5/5 with no deficits, no atrophy, normal muscle tone Psych: No signs of anxiety, depression or other mood disorder  Right knee: No swelling No deformity Neg fluid wave, joint milking ROM Flex 110 , Ext 0  NTTP over the quad tendon,  medial fem condyle, lat fem condyle, patella, plica, patella tendon, tibial tuberostiy, fibular head, posterior fossa, pes anserine bursa, gerdy's tubercle, medial jt line, lateral jt line    Gait normal    Electronically signed by:  Benito Mccreedy D.[facility] 9:39 AM 12/13/21

## 2021-12-11 ENCOUNTER — Other Ambulatory Visit (HOSPITAL_COMMUNITY): Payer: Self-pay

## 2021-12-11 ENCOUNTER — Other Ambulatory Visit: Payer: Self-pay | Admitting: Internal Medicine

## 2021-12-13 ENCOUNTER — Other Ambulatory Visit (HOSPITAL_COMMUNITY): Payer: Self-pay

## 2021-12-13 ENCOUNTER — Ambulatory Visit (INDEPENDENT_AMBULATORY_CARE_PROVIDER_SITE_OTHER): Payer: 59 | Admitting: Sports Medicine

## 2021-12-13 VITALS — HR 84 | Ht 66.0 in | Wt 192.0 lb

## 2021-12-13 DIAGNOSIS — M545 Low back pain, unspecified: Secondary | ICD-10-CM

## 2021-12-13 DIAGNOSIS — M25561 Pain in right knee: Secondary | ICD-10-CM | POA: Diagnosis not present

## 2021-12-13 DIAGNOSIS — M1711 Unilateral primary osteoarthritis, right knee: Secondary | ICD-10-CM | POA: Diagnosis not present

## 2021-12-13 DIAGNOSIS — G8929 Other chronic pain: Secondary | ICD-10-CM

## 2021-12-13 MED ORDER — ATORVASTATIN CALCIUM 10 MG PO TABS
10.0000 mg | ORAL_TABLET | Freq: Every day | ORAL | 0 refills | Status: DC
  Filled 2021-12-13: qty 90, 90d supply, fill #0

## 2021-12-13 MED ORDER — PROPRANOLOL HCL 60 MG PO TABS
60.0000 mg | ORAL_TABLET | Freq: Two times a day (BID) | ORAL | 11 refills | Status: DC
  Filled 2021-12-13: qty 60, 30d supply, fill #0
  Filled 2022-03-08: qty 60, 30d supply, fill #1
  Filled 2022-05-13: qty 60, 30d supply, fill #2
  Filled 2022-06-15: qty 60, 30d supply, fill #3
  Filled 2022-08-08: qty 60, 30d supply, fill #4
  Filled 2022-09-08 (×2): qty 60, 30d supply, fill #5

## 2021-12-13 NOTE — Patient Instructions (Addendum)
Good to see you  Voltaren gel over areas of pain 1-2 times per day  Continue HEP for knee  6 month follow up or sooner if you have a pain flare up

## 2022-02-20 ENCOUNTER — Encounter: Payer: Self-pay | Admitting: Internal Medicine

## 2022-03-08 ENCOUNTER — Other Ambulatory Visit (HOSPITAL_COMMUNITY): Payer: Self-pay

## 2022-03-08 ENCOUNTER — Other Ambulatory Visit: Payer: Self-pay | Admitting: Internal Medicine

## 2022-03-08 MED ORDER — ATORVASTATIN CALCIUM 10 MG PO TABS
10.0000 mg | ORAL_TABLET | Freq: Every day | ORAL | 2 refills | Status: DC
  Filled 2022-03-08: qty 90, 90d supply, fill #0
  Filled 2022-06-15: qty 90, 90d supply, fill #1
  Filled 2022-09-08 (×2): qty 90, 90d supply, fill #2

## 2022-03-09 ENCOUNTER — Other Ambulatory Visit (HOSPITAL_COMMUNITY): Payer: Self-pay

## 2022-04-03 ENCOUNTER — Encounter: Payer: Self-pay | Admitting: Internal Medicine

## 2022-04-03 NOTE — Patient Instructions (Addendum)
      Blood work was ordered.   The lab is on the first floor.    Medications changes include :   none   An MRI of your right knee was ordered.  .    Return in about 6 months (around 10/05/2022) for Physical Exam.

## 2022-04-03 NOTE — Progress Notes (Signed)
Subjective:    Patient ID: Mary [name], female    DOB: Nov 13, 1965, 57 y.o.   MRN: [id number]     HPI Mary [name] is here for follow up of her chronic medical problems.  She is concerned about her chronic right knee pain and back pain   Had injections in right knee - CSI - help minimally.  Had HA injection - painful. Zilretta.  Xray showed mild OA.   Pain is whole and extends to anterior mid lower leg.  Pain is there all the time.  Can not sit or walk long times due to pain.  The knee cracks and she sometimes feels something shift in the knee that cause more severe pain.    Lower back pain is better.  She stopped the meloxicam 6 months ago.  She uses ice as needed.  After walking long periods she may get pain and then rest helps.    Medications and allergies reviewed with patient and updated if appropriate.  Current Outpatient Medications on File Prior to Visit  Medication Sig Dispense Refill   Aspirin-Acetaminophen-Caffeine (EXCEDRIN MIGRAINE PO) Take by mouth as needed.     atorvastatin (LIPITOR) 10 MG tablet Take 1 tablet (10 mg total) by mouth daily. 90 tablet 2   Calcium Carbonate-Vitamin D 600-400 MG-UNIT tablet Take 1 tablet by mouth daily.     losartan (COZAAR) 50 MG tablet Take 1 tablet (50 mg total) by mouth daily. 90 tablet 3   Multiple Vitamin (MULTIVITAMIN) tablet Take 1 tablet by mouth daily.     propranolol (INDERAL) 60 MG tablet Take 1 tablet (60 mg total) by mouth 2 (two) times daily. 60 tablet 11   No current facility-administered medications on file prior to visit.     Review of Systems  Constitutional:  Negative for fever.  Respiratory:  Negative for cough, shortness of breath and wheezing.   Cardiovascular:  Negative for chest pain, palpitations and leg swelling.  Neurological:  Positive for headaches (occ). Negative for light-headedness.       Objective:   Vitals:   04/04/22 0932  BP: 126/72  Pulse: (!) 55  Temp: 98.2 F (36.8 C)  SpO2:  97%   BP Readings from Last 3 Encounters:  04/04/22 126/72  11/09/21 132/68  10/19/21 124/82   Wt Readings from Last 3 Encounters:  04/04/22 189 lb (85.7 kg)  12/13/21 192 lb (87.1 kg)  11/16/21 197 lb (89.4 kg)   Body mass index is 30.51 kg/m.    Physical Exam Constitutional:      General: She is not in acute distress.    Appearance: Normal appearance.  HENT:     Head: Normocephalic and atraumatic.  Eyes:     Conjunctiva/sclera: Conjunctivae normal.  Cardiovascular:     Rate and Rhythm: Normal rate and regular rhythm.     Heart sounds: Normal heart sounds.  Pulmonary:     Effort: Pulmonary effort is normal. No respiratory distress.     Breath sounds: Normal breath sounds. No wheezing.  Musculoskeletal:        General: Swelling (Right knee) present.     Cervical back: Neck supple.     Right lower leg: No edema.     Left lower leg: No edema.  Lymphadenopathy:     Cervical: No cervical adenopathy.  Skin:    General: Skin is warm and dry.     Findings: No rash.  Neurological:     Mental Status: She is alert.  Mental status is at baseline.  Psychiatric:        Mood and Affect: Mood normal.        Behavior: Behavior normal.        Lab Results  Component Value Date   WBC 4.1 10/01/2021   HGB 12.4 10/01/2021   HCT 37.6 10/01/2021   PLT 183.0 10/01/2021   GLUCOSE 90 10/01/2021   CHOL 163 10/01/2021   TRIG 51.0 10/01/2021   HDL 70.20 10/01/2021   LDLDIRECT 127.9 12/05/2011   LDLCALC 82 10/01/2021   ALT 16 10/01/2021   AST 21 10/01/2021   NA 139 10/01/2021   K 4.0 10/01/2021   CL 104 10/01/2021   CREATININE 0.83 10/01/2021   BUN 12 10/01/2021   CO2 30 10/01/2021   TSH 0.68 10/01/2021   HGBA1C 6.2 10/01/2021     Assessment & Plan:    See Problem List for Assessment and Plan of chronic medical problems.

## 2022-04-04 ENCOUNTER — Ambulatory Visit: Payer: 59 | Admitting: Internal Medicine

## 2022-04-04 ENCOUNTER — Other Ambulatory Visit (HOSPITAL_COMMUNITY): Payer: Self-pay

## 2022-04-04 VITALS — BP 126/72 | HR 55 | Temp 98.2°F | Ht 66.0 in | Wt 189.0 lb

## 2022-04-04 DIAGNOSIS — E782 Mixed hyperlipidemia: Secondary | ICD-10-CM | POA: Diagnosis not present

## 2022-04-04 DIAGNOSIS — Z6831 Body mass index (BMI) 31.0-31.9, adult: Secondary | ICD-10-CM | POA: Diagnosis not present

## 2022-04-04 DIAGNOSIS — M25561 Pain in right knee: Secondary | ICD-10-CM

## 2022-04-04 DIAGNOSIS — G43009 Migraine without aura, not intractable, without status migrainosus: Secondary | ICD-10-CM | POA: Diagnosis not present

## 2022-04-04 DIAGNOSIS — I10 Essential (primary) hypertension: Secondary | ICD-10-CM

## 2022-04-04 DIAGNOSIS — R7303 Prediabetes: Secondary | ICD-10-CM

## 2022-04-04 DIAGNOSIS — R739 Hyperglycemia, unspecified: Secondary | ICD-10-CM

## 2022-04-04 DIAGNOSIS — Z0000 Encounter for general adult medical examination without abnormal findings: Secondary | ICD-10-CM

## 2022-04-04 DIAGNOSIS — E6609 Other obesity due to excess calories: Secondary | ICD-10-CM

## 2022-04-04 DIAGNOSIS — K5900 Constipation, unspecified: Secondary | ICD-10-CM

## 2022-04-04 DIAGNOSIS — E010 Iodine-deficiency related diffuse (endemic) goiter: Secondary | ICD-10-CM

## 2022-04-04 DIAGNOSIS — E66811 Obesity, class 1: Secondary | ICD-10-CM

## 2022-04-04 DIAGNOSIS — G8929 Other chronic pain: Secondary | ICD-10-CM

## 2022-04-04 LAB — CBC WITH DIFFERENTIAL/PLATELET
Basophils Absolute: 0 10*3/uL (ref 0.0–0.1)
Basophils Relative: 0.3 % (ref 0.0–3.0)
Eosinophils Absolute: 0.1 10*3/uL (ref 0.0–0.7)
Eosinophils Relative: 2.4 % (ref 0.0–5.0)
HCT: 39.1 % (ref 36.0–46.0)
Hemoglobin: 13.2 g/dL (ref 12.0–15.0)
Lymphocytes Relative: 37.4 % (ref 12.0–46.0)
Lymphs Abs: 1.6 10*3/uL (ref 0.7–4.0)
MCHC: 33.6 g/dL (ref 30.0–36.0)
MCV: 92.2 fl (ref 78.0–100.0)
Monocytes Absolute: 0.4 10*3/uL (ref 0.1–1.0)
Monocytes Relative: 8.5 % (ref 3.0–12.0)
Neutro Abs: 2.3 10*3/uL (ref 1.4–7.7)
Neutrophils Relative %: 51.4 % (ref 43.0–77.0)
Platelets: 211 10*3/uL (ref 150.0–400.0)
RBC: 4.24 Mil/uL (ref 3.87–5.11)
RDW: 13.9 % (ref 11.5–15.5)
WBC: 4.4 10*3/uL (ref 4.0–10.5)

## 2022-04-04 LAB — COMPREHENSIVE METABOLIC PANEL
ALT: 12 U/L (ref 0–35)
AST: 18 U/L (ref 0–37)
Albumin: 4 g/dL (ref 3.5–5.2)
Alkaline Phosphatase: 78 U/L (ref 39–117)
BUN: 10 mg/dL (ref 6–23)
CO2: 29 mEq/L (ref 19–32)
Calcium: 9.8 mg/dL (ref 8.4–10.5)
Chloride: 101 mEq/L (ref 96–112)
Creatinine, Ser: 0.76 mg/dL (ref 0.40–1.20)
GFR: 88.64 mL/min (ref >60.00)
Glucose, Bld: 97 mg/dL (ref 70–99)
Potassium: 4.3 mEq/L (ref 3.5–5.1)
Sodium: 138 mEq/L (ref 135–145)
Total Bilirubin: 1.2 mg/dL (ref 0.2–1.2)
Total Protein: 7.7 g/dL (ref 6.0–8.3)

## 2022-04-04 LAB — LIPID PANEL
Cholesterol: 172 mg/dL (ref 0–200)
HDL: 77.2 mg/dL (ref >39.00)
LDL Cholesterol: 82 mg/dL (ref 0–99)
NonHDL: 94.48
Total CHOL/HDL Ratio: 2
Triglycerides: 62 mg/dL (ref 0.0–149.0)
VLDL: 12.4 mg/dL (ref 0.0–40.0)

## 2022-04-04 LAB — TSH: TSH: 0.67 u[IU]/mL (ref 0.35–5.50)

## 2022-04-04 LAB — HEMOGLOBIN A1C: Hgb A1c MFr Bld: 6.1 % (ref 4.6–6.5)

## 2022-04-04 MED ORDER — LOSARTAN POTASSIUM 50 MG PO TABS
50.0000 mg | ORAL_TABLET | Freq: Every day | ORAL | 3 refills | Status: DC
  Filled 2022-04-04 – 2022-06-15 (×2): qty 90, 90d supply, fill #0
  Filled 2022-09-08 (×2): qty 90, 90d supply, fill #1
  Filled 2022-12-19: qty 90, 90d supply, fill #2
  Filled 2023-03-21: qty 90, 90d supply, fill #3

## 2022-04-04 NOTE — Assessment & Plan Note (Addendum)
Chronic Blood pressure well controlled CMP Continue losartan 50 mg daily, propranolol 60 mg daily (prevention for migraines)

## 2022-04-04 NOTE — Assessment & Plan Note (Signed)
Chronic Stable TSH 

## 2022-04-04 NOTE — Assessment & Plan Note (Addendum)
Chronic Encourage weight loss Stressed the importance of regular exercise-currently limited by knee pain Discussed healthy diet

## 2022-04-04 NOTE — Assessment & Plan Note (Signed)
Chronic Not controlled Drinks a lot of water Taking a stool softener daily  Taking metamucil but not daily Can try probiotics, magnesium or miralax

## 2022-04-04 NOTE — Assessment & Plan Note (Signed)
Chronic X-ray with minimal arthritis No improvement with steroid injections, HA injections Has done PT exercises at home Pain ongoing for more than 9 months Will get an MRI to evaluate further-concern for meniscus injury or other internal derangement

## 2022-04-04 NOTE — Assessment & Plan Note (Signed)
Chronic Controlled Continue propranolol 60 mg daily-will increase to twice daily if migraines are not controlled

## 2022-04-04 NOTE — Assessment & Plan Note (Signed)
Chronic Regular exercise and healthy diet encouraged Check lipid panel  Continue atorvastatin 10 mg daily 

## 2022-04-04 NOTE — Assessment & Plan Note (Signed)
Chronic Check a1c Low sugar / carb diet Stressed regular exercise  

## 2022-04-10 ENCOUNTER — Ambulatory Visit
Admission: RE | Admit: 2022-04-10 | Discharge: 2022-04-10 | Disposition: A | Discharge status: Home / Self Care | Payer: 59 | Source: Ambulatory Visit | Attending: Internal Medicine | Admitting: Internal Medicine

## 2022-04-10 DIAGNOSIS — G8929 Other chronic pain: Secondary | ICD-10-CM

## 2022-04-10 DIAGNOSIS — S83271A Complex tear of lateral meniscus, current injury, right knee, initial encounter: Secondary | ICD-10-CM | POA: Diagnosis not present

## 2022-04-13 ENCOUNTER — Encounter: Payer: Self-pay | Admitting: Internal Medicine

## 2022-04-13 DIAGNOSIS — M25569 Pain in unspecified knee: Secondary | ICD-10-CM

## 2022-05-13 ENCOUNTER — Other Ambulatory Visit (HOSPITAL_COMMUNITY): Payer: Self-pay

## 2022-05-17 ENCOUNTER — Ambulatory Visit: Payer: 59 | Admitting: Orthopaedic Surgery

## 2022-05-17 ENCOUNTER — Other Ambulatory Visit (HOSPITAL_COMMUNITY): Payer: Self-pay

## 2022-05-17 ENCOUNTER — Encounter: Payer: Self-pay | Admitting: Orthopaedic Surgery

## 2022-05-17 DIAGNOSIS — G8929 Other chronic pain: Secondary | ICD-10-CM

## 2022-05-17 DIAGNOSIS — M25561 Pain in right knee: Secondary | ICD-10-CM | POA: Diagnosis not present

## 2022-05-17 MED ORDER — CELECOXIB 200 MG PO CAPS
200.0000 mg | ORAL_CAPSULE | Freq: Two times a day (BID) | ORAL | 0 refills | Status: AC
  Filled 2022-05-17: qty 60, 30d supply, fill #0

## 2022-05-17 NOTE — Progress Notes (Signed)
Office Visit Note   Patient: Mary [name]           Date of Birth: 07-Sep-1965           MRN: [id number] Visit Date: 05/17/2022              Requested by: Pincus Sanes, MD [address] [place],  [place] [address] PCP: Pincus Sanes, MD   Assessment & Plan: Visit Diagnoses:  1. 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  The x-rays and the MRIs are consistent with DJD.  I do not think the meniscal tears explain all of her symptoms.  Treatment options were explained to include arthroscopic debridement of the menisci versus total knee replacement and their likelihood of providing long-term pain relief.  She will think about her options.  For now I will send in a prescription for Celebrex.  Follow-up as needed.  Follow-Up Instructions: No follow-ups on file.   Orders:  No orders of the defined types were placed in this encounter.  Meds ordered this encounter  Medications   celecoxib (CELEBREX) 200 MG capsule    Sig: Take 1 capsule (200 mg total) by mouth 2 (two) times daily.    Dispense:  60 capsule    Refill:  0      Procedures: No procedures performed   Clinical Data: No additional findings.   Subjective: Chief Complaint  Patient presents with   Right Knee - Pain    HPI  Mary [name] is a very pleasant 57 year old female here for evaluation of chronic right knee pain for 9 months.  She has seen her PCP and sports medicine doctor for conservative management.  She has tried gel and steroid injections and they have not been very successful.  She endorses locking giving way and swelling.  Endorses start up stiffness.  Review of Systems  Constitutional: Negative.   HENT: Negative.    Eyes: Negative.   Respiratory: Negative.    Cardiovascular: Negative.   Endocrine: Negative.   Musculoskeletal: Negative.   Neurological: Negative.   Hematological: Negative.    Psychiatric/Behavioral: Negative.    All other systems reviewed and are negative.    Objective: Vital Signs: There were no vitals taken for this visit.  Physical Exam Vitals and nursing note reviewed.  Constitutional:      Appearance: She is well-developed.  HENT:     Head: Atraumatic.     Nose: Nose normal.  Eyes:     Extraocular Movements: Extraocular movements intact.  Cardiovascular:     Pulses: Normal pulses.  Pulmonary:     Effort: Pulmonary effort is normal.  Abdominal:     Palpations: Abdomen is soft.  Musculoskeletal:     Cervical back: Neck supple.  Skin:    General: Skin is warm.     Capillary Refill: Capillary refill takes less than 2 seconds.  Neurological:     Mental Status: She is alert. Mental status is at baseline.  Psychiatric:        Behavior: Behavior normal.        Thought Content: Thought content normal.        Judgment: Judgment normal.     Ortho Exam  Examination of right knee shows a trace effusion.  Medial and lateral joint line tenderness worse on the lateral side.  Collaterals and cruciates are stable.  Pain and crepitus with range of  motion.  Specialty Comments:  No specialty comments available.  Imaging: No results found.   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   Obesity 11/17/2014   Knee pain, right 04/09/2013   Constipation 07/08/2009   Past Medical History:  Diagnosis Date   Allergy    Arthritis    Headache(784.0)    Hyperlipidemia     Family History  Problem Relation Age of Onset   Migraines Mother    Colon cancer Neg Hx    Esophageal cancer Neg Hx    Rectal cancer Neg Hx    Stomach cancer Neg Hx    Colon polyps Neg Hx     Past  Surgical History:  Procedure Laterality Date   CESAREAN SECTION     Social History   Occupational History    Employer: [employer]  Tobacco Use   Smoking status: Never   Smokeless tobacco: Never  Vaping Use   Vaping Use: Never used  Substance and Sexual Activity   Alcohol use: No   Drug use: No   Sexual activity: Yes

## 2022-05-18 ENCOUNTER — Other Ambulatory Visit (HOSPITAL_COMMUNITY): Payer: Self-pay

## 2022-05-30 NOTE — Patient Instructions (Signed)
        Medications changes include :   medrol pak (steroid), zofran for nausea      Return if symptoms worsen or fail to improve.

## 2022-05-30 NOTE — Progress Notes (Unsigned)
    Subjective:    Patient ID: Mary [name], female    DOB: 1965/09/09, 57 y.o.   MRN: [id number]      HPI Mary [name] is here for No chief complaint on file.   Headaches x 1 week -    H/o migraines - take propranolol and excedrine migraine prn.   Medications and allergies reviewed with patient and updated if appropriate.  Current Outpatient Medications on File Prior to Visit  Medication Sig Dispense Refill   Aspirin-Acetaminophen-Caffeine (EXCEDRIN MIGRAINE PO) Take by mouth as needed.     atorvastatin (LIPITOR) 10 MG tablet Take 1 tablet (10 mg total) by mouth daily. 90 tablet 2   Calcium Carbonate-Vitamin D 600-400 MG-UNIT tablet Take 1 tablet by mouth daily.     celecoxib (CELEBREX) 200 MG capsule Take 1 capsule (200 mg total) by mouth 2 (two) times daily. 60 capsule 0   losartan (COZAAR) 50 MG tablet Take 1 tablet (50 mg total) by mouth daily. 90 tablet 3   Multiple Vitamin (MULTIVITAMIN) tablet Take 1 tablet by mouth daily.     propranolol (INDERAL) 60 MG tablet Take 1 tablet (60 mg total) by mouth 2 (two) times daily. 60 tablet 11   No current facility-administered medications on file prior to visit.    Review of Systems     Objective:  There were no vitals filed for this visit. BP Readings from Last 3 Encounters:  04/04/22 126/72  11/09/21 132/68  10/19/21 124/82   Wt Readings from Last 3 Encounters:  04/04/22 189 lb (85.7 kg)  12/13/21 192 lb (87.1 kg)  11/16/21 197 lb (89.4 kg)   There is no height or weight on file to calculate BMI.    Physical Exam         Assessment & Plan:    See Problem List for Assessment and Plan of chronic medical problems.

## 2022-05-31 ENCOUNTER — Other Ambulatory Visit: Payer: Self-pay

## 2022-05-31 ENCOUNTER — Ambulatory Visit: Payer: 59 | Admitting: Internal Medicine

## 2022-05-31 ENCOUNTER — Other Ambulatory Visit (HOSPITAL_COMMUNITY): Payer: Self-pay

## 2022-05-31 ENCOUNTER — Encounter: Payer: Self-pay | Admitting: Internal Medicine

## 2022-05-31 VITALS — BP 134/74 | HR 61 | Temp 98.1°F | Ht 66.0 in | Wt 193.0 lb

## 2022-05-31 DIAGNOSIS — R519 Headache, unspecified: Secondary | ICD-10-CM | POA: Insufficient documentation

## 2022-05-31 DIAGNOSIS — I10 Essential (primary) hypertension: Secondary | ICD-10-CM

## 2022-05-31 MED ORDER — ONDANSETRON HCL 4 MG PO TABS
4.0000 mg | ORAL_TABLET | Freq: Three times a day (TID) | ORAL | 0 refills | Status: DC | PRN
  Filled 2022-05-31: qty 20, 7d supply, fill #0

## 2022-05-31 MED ORDER — METHYLPREDNISOLONE 4 MG PO TBPK
ORAL_TABLET | ORAL | 0 refills | Status: DC
  Filled 2022-05-31 (×2): qty 21, 6d supply, fill #0

## 2022-05-31 NOTE — Assessment & Plan Note (Signed)
Acute Intractable headache for the past week Not typical of her usual migraines No obvious trigger Excedrin Migraine did not help-she stopped taking it Not taking anything currently Given that it is intractable and not a typical migraine will do a Medrol Dosepak and Zofran as needed for nausea If her pain does not improve/resolve she will let me know and we will pursue imaging, but at this time I do not think that is necessary

## 2022-05-31 NOTE — Assessment & Plan Note (Signed)
Chronic Continue losartan 50 mg daily, propranolol 60 mg daily (prevention for migraines)

## 2022-06-01 ENCOUNTER — Other Ambulatory Visit (HOSPITAL_BASED_OUTPATIENT_CLINIC_OR_DEPARTMENT_OTHER): Payer: Self-pay

## 2022-06-06 ENCOUNTER — Encounter: Payer: Self-pay | Admitting: Internal Medicine

## 2022-06-15 ENCOUNTER — Other Ambulatory Visit (HOSPITAL_COMMUNITY): Payer: Self-pay

## 2022-07-07 ENCOUNTER — Encounter: Payer: Self-pay | Admitting: Internal Medicine

## 2022-07-14 DIAGNOSIS — Z0279 Encounter for issue of other medical certificate: Secondary | ICD-10-CM

## 2022-07-17 ENCOUNTER — Encounter: Payer: Self-pay | Admitting: Internal Medicine

## 2022-07-25 ENCOUNTER — Encounter: Payer: Self-pay | Admitting: Orthopaedic Surgery

## 2022-07-25 ENCOUNTER — Other Ambulatory Visit: Payer: Self-pay | Admitting: Orthopaedic Surgery

## 2022-07-25 ENCOUNTER — Other Ambulatory Visit (HOSPITAL_COMMUNITY): Payer: Self-pay

## 2022-07-25 MED ORDER — DICLOFENAC SODIUM 75 MG PO TBEC
75.0000 mg | DELAYED_RELEASE_TABLET | Freq: Two times a day (BID) | ORAL | 2 refills | Status: DC
  Filled 2022-07-25: qty 30, 15d supply, fill #0
  Filled 2022-08-08: qty 30, 15d supply, fill #1

## 2022-07-25 NOTE — Telephone Encounter (Signed)
Patient has picked up forms.

## 2022-07-26 ENCOUNTER — Other Ambulatory Visit (HOSPITAL_COMMUNITY): Payer: Self-pay

## 2022-07-26 ENCOUNTER — Telehealth: Payer: Self-pay | Admitting: Internal Medicine

## 2022-07-26 NOTE — Telephone Encounter (Signed)
FMLA paperwork received via fax & placed in provider's box up front.

## 2022-07-27 NOTE — Telephone Encounter (Signed)
Copy.  Form was completed and patient picked up other copy of form on yesterday.

## 2022-08-01 NOTE — Telephone Encounter (Signed)
Paperwork received via [employer] for completion. Placed in provider's box up front.

## 2022-08-01 NOTE — Telephone Encounter (Signed)
This has been completed. Faxed & pt has picked up copy

## 2022-08-08 ENCOUNTER — Other Ambulatory Visit (HOSPITAL_COMMUNITY): Payer: Self-pay

## 2022-08-22 ENCOUNTER — Encounter: Payer: Self-pay | Admitting: Internal Medicine

## 2022-08-23 ENCOUNTER — Ambulatory Visit (INDEPENDENT_AMBULATORY_CARE_PROVIDER_SITE_OTHER): Payer: 59 | Admitting: Sports Medicine

## 2022-08-23 ENCOUNTER — Ambulatory Visit (INDEPENDENT_AMBULATORY_CARE_PROVIDER_SITE_OTHER): Payer: 59

## 2022-08-23 VITALS — HR 76 | Ht 66.0 in | Wt 196.0 lb

## 2022-08-23 DIAGNOSIS — M25562 Pain in left knee: Secondary | ICD-10-CM

## 2022-08-23 DIAGNOSIS — M1712 Unilateral primary osteoarthritis, left knee: Secondary | ICD-10-CM | POA: Diagnosis not present

## 2022-08-23 DIAGNOSIS — M25462 Effusion, left knee: Secondary | ICD-10-CM | POA: Diagnosis not present

## 2022-08-23 NOTE — Progress Notes (Signed)
[name] [facility] [address] [place] [address] Phone: [phone]   Assessment and Plan:     1. Acute pain of left knee 2. Primary osteoarthritis of left knee  -Chronic with exacerbation, initial sports medicine visit - Several weeks of left knee pain consistent with flare of osteoarthritis based on HPI, physical exam, x-ray imaging - Patient has had no relief with use of Celebrex or diclofenac, so we will not use additional NSAID course - Patient elected for CSI.  Tolerated well per note below - Start HEP for knee - May use Tylenol as needed for day-to-day pain relief - X-ray obtained in clinic.  My interpretation: No acute fracture or dislocation.  Moderate degenerative changes in medial and patellofemoral compartments  Procedure: Knee Joint Injection Side: Left Indication: Flare of osteoarthritis  Risks explained and consent was given verbally. The site was cleaned with alcohol prep. A needle was introduced with an anterio-lateral approach. Injection given using 2mL of 1% lidocaine without epinephrine and 1mL of kenalog 40mg /ml. This was well tolerated and resulted in symptomatic relief.  Needle was removed, hemostasis achieved, and post injection instructions were explained.   Pt was advised to call or return to clinic if these symptoms worsen or fail to improve as anticipated.    Pertinent previous records reviewed include none   Follow Up: 4 weeks for reevaluation.  If no improvement or worsening of symptoms, could consider Zilretta injection   Subjective:   I, [name], am serving as a Neurosurgeon for [name]  Chief Complaint: left knee pain   HPI:   08/23/22 Patient is a 57 year old female complaining of left knee pain. Patient states that she has pain that is just like the right knee pain since may . No MOI. Pain radiates through the whole leg.  she has pain with all movements. Nom meds for the  pain . Diclofenac and celebrex don't help. States she has swelling. She is not able to work due to pain    Relevant Historical Information: Hypertension  Additional pertinent review of systems negative.   Current Outpatient Medications:    Aspirin-Acetaminophen-Caffeine (EXCEDRIN MIGRAINE PO), Take by mouth as needed., Disp: , Rfl:    atorvastatin (LIPITOR) 10 MG tablet, Take 1 tablet (10 mg total) by mouth daily., Disp: 90 tablet, Rfl: 2   Calcium Carbonate-Vitamin D 600-400 MG-UNIT tablet, Take 1 tablet by mouth daily., Disp: , Rfl:    diclofenac (VOLTAREN) 75 MG EC tablet, Take 1 tablet (75 mg) by mouth 2 times daily., Disp: 30 tablet, Rfl: 2   losartan (COZAAR) 50 MG tablet, Take 1 tablet (50 mg total) by mouth daily., Disp: 90 tablet, Rfl: 3   Multiple Vitamin (MULTIVITAMIN) tablet, Take 1 tablet by mouth daily., Disp: , Rfl:    propranolol (INDERAL) 60 MG tablet, Take 1 tablet (60 mg total) by mouth 2 (two) times daily., Disp: 60 tablet, Rfl: 11   methylPREDNISolone (MEDROL DOSEPAK) 4 MG TBPK tablet, use as directed on package, Disp: 21 tablet, Rfl: 0   ondansetron (ZOFRAN) 4 MG tablet, Take 1 tablet (4 mg total) by mouth every 8 (eight) hours as needed for nausea or vomiting., Disp: 20 tablet, Rfl: 0   Objective:     Vitals:   08/23/22 1105  Pulse: 76  SpO2: 99%  Weight: 196 lb (88.9 kg)  Height: 5\' 6"  (1.676 m)      Body mass index is 31.64 kg/m.  Physical Exam:    General:  awake, alert oriented, no acute distress nontoxic Skin: no suspicious lesions or rashes Neuro:sensation intact and strength 5/5 with no deficits, no atrophy, normal muscle tone Psych: No signs of anxiety, depression or other mood disorder  Left knee: Mild swelling No deformity Neg fluid wave, joint milking ROM Flex 90, Ext 20 Medial joint line, lateral joint line, medial femoral condyle, patellar tendon, quadriceps tendon NTTP over the  , lat fem condyle, patella, plica, tibial tuberostiy,  fibular head, posterior fossa, pes anserine bursa  Neg anterior and posterior drawer Neg lachman Neg sag sign Negative varus stress Negative valgus stress Positive McMurray for pain    Gait antalgic   Electronically signed by:  [name] 12:19 PM 08/23/22

## 2022-08-23 NOTE — Patient Instructions (Addendum)
Knee HEP  4 week follow up

## 2022-08-29 ENCOUNTER — Encounter: Payer: Self-pay | Admitting: Sports Medicine

## 2022-09-02 ENCOUNTER — Ambulatory Visit: Payer: 59 | Admitting: Internal Medicine

## 2022-09-08 ENCOUNTER — Other Ambulatory Visit (HOSPITAL_COMMUNITY): Payer: Self-pay

## 2022-09-09 ENCOUNTER — Other Ambulatory Visit (HOSPITAL_COMMUNITY): Payer: Self-pay

## 2022-09-16 NOTE — Progress Notes (Unsigned)
    [name] [address] [place] [address] Phone: [phone]   Assessment and Plan:     There are no diagnoses linked to this encounter.  ***   Pertinent previous records reviewed include ***   Follow Up: ***     Subjective:   I, Mary [name], am serving as a Neurosurgeon for [name]   Chief Complaint: left knee pain    HPI:    08/23/22 Patient is a 57 year old female complaining of left knee pain. Patient states that she has pain that is just like the right knee pain since may . No MOI. Pain radiates through the whole leg.  she has pain with all movements. Nom meds for the pain . Diclofenac and celebrex don't help. States she has swelling. She is not able to work due to pain    09/20/2022 Patient states    Relevant Historical Information: Hypertension  Additional pertinent review of systems negative.   Current Outpatient Medications:    Aspirin-Acetaminophen-Caffeine (EXCEDRIN MIGRAINE PO), Take by mouth as needed., Disp: , Rfl:    atorvastatin (LIPITOR) 10 MG tablet, Take 1 tablet (10 mg total) by mouth daily., Disp: 90 tablet, Rfl: 2   Calcium Carbonate-Vitamin D 600-400 MG-UNIT tablet, Take 1 tablet by mouth daily., Disp: , Rfl:    diclofenac (VOLTAREN) 75 MG EC tablet, Take 1 tablet (75 mg) by mouth 2 times daily., Disp: 30 tablet, Rfl: 2   losartan (COZAAR) 50 MG tablet, Take 1 tablet (50 mg total) by mouth daily., Disp: 90 tablet, Rfl: 3   methylPREDNISolone (MEDROL DOSEPAK) 4 MG TBPK tablet, use as directed on package, Disp: 21 tablet, Rfl: 0   Multiple Vitamin (MULTIVITAMIN) tablet, Take 1 tablet by mouth daily., Disp: , Rfl:    ondansetron (ZOFRAN) 4 MG tablet, Take 1 tablet (4 mg total) by mouth every 8 (eight) hours as needed for nausea or vomiting., Disp: 20 tablet, Rfl: 0   propranolol (INDERAL) 60 MG tablet, Take 1 tablet (60 mg total) by mouth 2 (two) times daily., Disp: 60 tablet, Rfl:  11   Objective:     There were no vitals filed for this visit.    There is no height or weight on file to calculate BMI.    Physical Exam:    ***   Electronically signed by:  [name] 7:11 AM 09/16/22

## 2022-09-20 ENCOUNTER — Ambulatory Visit (INDEPENDENT_AMBULATORY_CARE_PROVIDER_SITE_OTHER): Payer: 59 | Admitting: Sports Medicine

## 2022-09-20 VITALS — BP 126/78 | HR 68 | Ht 66.0 in | Wt 197.0 lb

## 2022-09-20 DIAGNOSIS — M1712 Unilateral primary osteoarthritis, left knee: Secondary | ICD-10-CM | POA: Diagnosis not present

## 2022-09-20 DIAGNOSIS — M25562 Pain in left knee: Secondary | ICD-10-CM | POA: Diagnosis not present

## 2022-09-20 NOTE — Patient Instructions (Signed)
Zilretta prior auth  1 week follow up

## 2022-09-29 NOTE — Progress Notes (Signed)
    [name] [address] [place] [address] Phone: [phone]   Assessment and Plan:     1. Chronic pain of left knee 2. Primary osteoarthritis of left knee -Chronic with exacerbation, subsequent visit - Patient presents for procedure only Zilretta injection intra-articular left knee for treatment of flare of osteoarthritis.  Tolerated well per note below - Patient had no relief with use of Celebrex or diclofenac - Patient previously had no relief with HA injection to right knee  Procedure: Knee Joint Injection Side: Left Indication: Flare of osteoarthritis  Risks explained and consent was given verbally. The site was cleaned with alcohol prep. A needle was introduced with an anterio-lateral approach. Injection given using Zilretta 32 mg. This was well tolerated and resulted in symptomatic relief.  Needle was removed, hemostasis achieved, and post injection instructions were explained.   Pt was advised to call or return to clinic if these symptoms worsen or fail to improve as anticipated.    Pertinent previous records reviewed include none   Follow Up: 4 weeks for reevaluation.  If no improvement or worsening of symptoms, could consider physical therapy versus advanced imaging versus orthopedic surgery referral   Subjective:   I, Mary [name], am serving as a Neurosurgeon for [name]   Chief Complaint: left knee pain    HPI:    08/23/22 Patient is a 57 year old female complaining of left knee pain. Patient states that she has pain that is just like the right knee pain since may . No MOI. Pain radiates through the whole leg.  she has pain with all movements. Nom meds for the pain . Diclofenac and celebrex don't help. States she has swelling. She is not able to work due to pain     09/20/2022 Patient states that shot didn't help    09/30/2022 Patient states   Relevant Historical Information:  Hypertension  Additional pertinent review of systems negative.   Current Outpatient Medications:    Aspirin-Acetaminophen-Caffeine (EXCEDRIN MIGRAINE PO), Take by mouth as needed., Disp: , Rfl:    atorvastatin (LIPITOR) 10 MG tablet, Take 1 tablet (10 mg total) by mouth daily., Disp: 90 tablet, Rfl: 2   Calcium Carbonate-Vitamin D 600-400 MG-UNIT tablet, Take 1 tablet by mouth daily., Disp: , Rfl:    diclofenac (VOLTAREN) 75 MG EC tablet, Take 1 tablet (75 mg) by mouth 2 times daily., Disp: 30 tablet, Rfl: 2   losartan (COZAAR) 50 MG tablet, Take 1 tablet (50 mg total) by mouth daily., Disp: 90 tablet, Rfl: 3   methylPREDNISolone (MEDROL DOSEPAK) 4 MG TBPK tablet, use as directed on package, Disp: 21 tablet, Rfl: 0   Multiple Vitamin (MULTIVITAMIN) tablet, Take 1 tablet by mouth daily., Disp: , Rfl:    ondansetron (ZOFRAN) 4 MG tablet, Take 1 tablet (4 mg total) by mouth every 8 (eight) hours as needed for nausea or vomiting., Disp: 20 tablet, Rfl: 0   propranolol (INDERAL) 60 MG tablet, Take 1 tablet (60 mg total) by mouth 2 (two) times daily., Disp: 60 tablet, Rfl: 11       Electronically signed by:  [name] 9:41 AM 09/30/22

## 2022-09-30 ENCOUNTER — Ambulatory Visit (INDEPENDENT_AMBULATORY_CARE_PROVIDER_SITE_OTHER): Payer: 59 | Admitting: Sports Medicine

## 2022-09-30 DIAGNOSIS — M25562 Pain in left knee: Secondary | ICD-10-CM

## 2022-09-30 DIAGNOSIS — G8929 Other chronic pain: Secondary | ICD-10-CM | POA: Diagnosis not present

## 2022-09-30 DIAGNOSIS — M1712 Unilateral primary osteoarthritis, left knee: Secondary | ICD-10-CM

## 2022-09-30 MED ORDER — TRIAMCINOLONE ACETONIDE 32 MG IX SRER
32.0000 mg | Freq: Once | INTRA_ARTICULAR | Status: AC
  Administered 2022-09-30: 32 mg via INTRA_ARTICULAR

## 2022-10-19 ENCOUNTER — Other Ambulatory Visit (HOSPITAL_COMMUNITY): Payer: Self-pay

## 2022-10-19 ENCOUNTER — Other Ambulatory Visit (HOSPITAL_BASED_OUTPATIENT_CLINIC_OR_DEPARTMENT_OTHER): Payer: Self-pay

## 2022-10-19 MED ORDER — METHOCARBAMOL 500 MG PO TABS
500.0000 mg | ORAL_TABLET | Freq: Three times a day (TID) | ORAL | 0 refills | Status: DC | PRN
Start: 2022-10-19 — End: 2023-08-07
  Filled 2022-10-19: qty 30, 10d supply, fill #0

## 2022-10-20 ENCOUNTER — Other Ambulatory Visit (HOSPITAL_COMMUNITY): Payer: Self-pay

## 2022-10-28 ENCOUNTER — Ambulatory Visit: Payer: 59 | Admitting: Sports Medicine

## 2022-11-01 ENCOUNTER — Telehealth: Payer: Self-pay | Admitting: Internal Medicine

## 2022-11-01 NOTE — Telephone Encounter (Signed)
FMLA paperwork received via fax and placed in provider's folder up front.

## 2022-11-02 NOTE — Telephone Encounter (Signed)
Recieved

## 2022-11-10 DIAGNOSIS — Z0279 Encounter for issue of other medical certificate: Secondary | ICD-10-CM

## 2022-11-10 NOTE — Telephone Encounter (Signed)
Form completed and awaiting signature

## 2022-11-11 NOTE — Telephone Encounter (Signed)
FMLA faxed today  

## 2022-12-19 ENCOUNTER — Other Ambulatory Visit (HOSPITAL_COMMUNITY): Payer: Self-pay

## 2022-12-19 ENCOUNTER — Other Ambulatory Visit: Payer: Self-pay | Admitting: Internal Medicine

## 2022-12-19 MED ORDER — ATORVASTATIN CALCIUM 10 MG PO TABS
10.0000 mg | ORAL_TABLET | Freq: Every day | ORAL | 2 refills | Status: DC
  Filled 2022-12-19: qty 90, 90d supply, fill #0
  Filled 2023-03-21: qty 90, 90d supply, fill #1
  Filled 2023-05-31 – 2023-06-01 (×2): qty 90, 90d supply, fill #2

## 2022-12-19 MED ORDER — PROPRANOLOL HCL 60 MG PO TABS
60.0000 mg | ORAL_TABLET | Freq: Two times a day (BID) | ORAL | 11 refills | Status: DC
  Filled 2022-12-19: qty 60, 30d supply, fill #0
  Filled 2023-03-21: qty 60, 30d supply, fill #1
  Filled 2023-05-31: qty 60, 30d supply, fill #2
  Filled 2023-06-29: qty 60, 30d supply, fill #3
  Filled 2023-10-07 (×2): qty 60, 30d supply, fill #4
  Filled 2023-12-12: qty 60, 30d supply, fill #5

## 2023-01-03 ENCOUNTER — Encounter: Payer: Self-pay | Admitting: Internal Medicine

## 2023-01-05 DIAGNOSIS — Z0279 Encounter for issue of other medical certificate: Secondary | ICD-10-CM

## 2023-02-06 ENCOUNTER — Encounter: Payer: Self-pay | Admitting: Internal Medicine

## 2023-02-06 NOTE — Progress Notes (Unsigned)
Subjective:    Patient ID: Mary [name], female    DOB: 1965-04-13, 58 y.o.   MRN: [id number]      HPI Mary [name] is here for a Physical exam and her chronic medical problems.   No concerns.   Limited by knee and back pain.  Has FMLA - does not call out often.  She would like to renew her FMLA just in case she needs it   Medications and allergies reviewed with patient and updated if appropriate.  Current Outpatient Medications on File Prior to Visit  Medication Sig Dispense Refill   Aspirin-Acetaminophen-Caffeine (EXCEDRIN MIGRAINE PO) Take by mouth as needed.     atorvastatin (LIPITOR) 10 MG tablet Take 1 tablet (10 mg total) by mouth daily. 90 tablet 2   Calcium Carbonate-Vitamin D 600-400 MG-UNIT tablet Take 1 tablet by mouth daily.     losartan (COZAAR) 50 MG tablet Take 1 tablet (50 mg total) by mouth daily. 90 tablet 3   methocarbamol (ROBAXIN) 500 MG tablet Take 1 tablet (500 mg total) by mouth every 8 (eight) hours as needed for muscle tightness 30 tablet 0   Multiple Vitamin (MULTIVITAMIN) tablet Take 1 tablet by mouth daily.     propranolol (INDERAL) 60 MG tablet Take 1 tablet (60 mg total) by mouth 2 (two) times daily. 60 tablet 11   No current facility-administered medications on file prior to visit.    Review of Systems  Constitutional:  Negative for fever.  Eyes:  Negative for visual disturbance.  Respiratory:  Positive for shortness of breath (when walking longer distances). Negative for cough and wheezing.   Cardiovascular:  Positive for chest pain (occ when she lays down at night - gerd) and leg swelling (LLE  from knee). Negative for palpitations.  Gastrointestinal:  Positive for constipation. Negative for abdominal pain, blood in stool and diarrhea.       Occ gerd  Genitourinary:  Negative for dysuria.  Musculoskeletal:  Positive for arthralgias (left knee) and back pain.  Skin:  Negative for rash.  Neurological:  Negative for light-headedness.  Headaches: occ. Psychiatric/Behavioral:  Negative for dysphoric mood. The patient is not nervous/anxious.        Objective:   Vitals:   02/07/23 0915  BP: 108/76  Pulse: (!) 55  Temp: 97.6 F (36.4 C)  SpO2: 95%   Filed Weights   02/07/23 0915  Weight: 189 lb (85.7 kg)   Body mass index is 30.51 kg/m.  BP Readings from Last 3 Encounters:  02/07/23 108/76  09/20/22 126/78  05/31/22 134/74    Wt Readings from Last 3 Encounters:  02/07/23 189 lb (85.7 kg)  09/20/22 197 lb (89.4 kg)  08/23/22 196 lb (88.9 kg)       Physical Exam Constitutional: She appears well-developed and well-nourished. No distress.  HENT:  Head: Normocephalic and atraumatic.  Right Ear: External ear normal. Normal ear canal and TM Left Ear: External ear normal.  Normal ear canal and TM Mouth/Throat: Oropharynx is clear and moist.  Eyes: Conjunctivae normal.  Neck: Neck supple. No tracheal deviation present. No thyromegaly present.  No carotid bruit  Cardiovascular: Normal rate, regular rhythm and normal heart sounds.   No murmur heard.  No edema. Pulmonary/Chest: Effort normal and breath sounds normal. No respiratory distress. She has no wheezes. She has no rales.  Breast: deferred   Abdominal: Soft. She exhibits no distension. There is no tenderness.  Lymphadenopathy: She has no cervical adenopathy.  Skin: Skin is  warm and dry. She is not diaphoretic.  Psychiatric: She has a normal mood and affect. Her behavior is normal.     Lab Results  Component Value Date   WBC 4.4 04/04/2022   HGB 13.2 04/04/2022   HCT 39.1 04/04/2022   PLT 211.0 04/04/2022   GLUCOSE 97 04/04/2022   CHOL 172 04/04/2022   TRIG 62.0 04/04/2022   HDL 77.20 04/04/2022   LDLDIRECT 127.9 12/05/2011   LDLCALC 82 04/04/2022   ALT 12 04/04/2022   AST 18 04/04/2022   NA 138 04/04/2022   K 4.3 04/04/2022   CL 101 04/04/2022   CREATININE 0.76 04/04/2022   BUN 10 04/04/2022   CO2 29 04/04/2022   TSH 0.67  04/04/2022   HGBA1C 6.1 04/04/2022         Assessment & Plan:   Physical exam: Screening blood work  ordered Exercise  stretching Weight  obese - encouraged weight loss Substance abuse  none   Reviewed recommended immunizations.   Health Maintenance  Topic Date Due   MAMMOGRAM  04/22/2022   COVID-19 Vaccine (4 - 2024-25 season) 02/23/2023 (Originally 09/18/2022)   DTaP/Tdap/Td (2 - Td or Tdap) 04/10/2023   Colonoscopy  08/29/2023   Cervical Cancer Screening (HPV/Pap Cotest)  04/21/2025   INFLUENZA VACCINE  Completed   Hepatitis C Screening  Completed   HIV Screening  Completed   Zoster Vaccines- Shingrix  Completed   HPV VACCINES  Aged Out          See Problem List for Assessment and Plan of chronic medical problems.

## 2023-02-06 NOTE — Patient Instructions (Addendum)

## 2023-02-07 ENCOUNTER — Ambulatory Visit: Payer: 59 | Admitting: Internal Medicine

## 2023-02-07 VITALS — BP 108/76 | HR 55 | Temp 97.6°F | Ht 66.0 in | Wt 189.0 lb

## 2023-02-07 DIAGNOSIS — E782 Mixed hyperlipidemia: Secondary | ICD-10-CM | POA: Diagnosis not present

## 2023-02-07 DIAGNOSIS — E66811 Other obesity due to excess calories: Secondary | ICD-10-CM

## 2023-02-07 DIAGNOSIS — R7303 Prediabetes: Secondary | ICD-10-CM | POA: Diagnosis not present

## 2023-02-07 DIAGNOSIS — G8929 Other chronic pain: Secondary | ICD-10-CM | POA: Diagnosis not present

## 2023-02-07 DIAGNOSIS — E6609 Other obesity due to excess calories: Secondary | ICD-10-CM | POA: Diagnosis not present

## 2023-02-07 DIAGNOSIS — I10 Essential (primary) hypertension: Secondary | ICD-10-CM | POA: Diagnosis not present

## 2023-02-07 DIAGNOSIS — Z0000 Encounter for general adult medical examination without abnormal findings: Secondary | ICD-10-CM

## 2023-02-07 DIAGNOSIS — G43009 Migraine without aura, not intractable, without status migrainosus: Secondary | ICD-10-CM

## 2023-02-07 DIAGNOSIS — M25561 Pain in right knee: Secondary | ICD-10-CM | POA: Diagnosis not present

## 2023-02-07 DIAGNOSIS — M545 Low back pain, unspecified: Secondary | ICD-10-CM

## 2023-02-07 DIAGNOSIS — Z6831 Body mass index (BMI) 31.0-31.9, adult: Secondary | ICD-10-CM

## 2023-02-07 LAB — LIPID PANEL
Cholesterol: 179 mg/dL (ref 0–200)
HDL: 73.4 mg/dL (ref >39.00)
LDL Cholesterol: 97 mg/dL (ref 0–99)
NonHDL: 105.88
Total CHOL/HDL Ratio: 2
Triglycerides: 45 mg/dL (ref 0.0–149.0)
VLDL: 9 mg/dL (ref 0.0–40.0)

## 2023-02-07 LAB — CBC WITH DIFFERENTIAL/PLATELET
Basophils Absolute: 0 10*3/uL (ref 0.0–0.1)
Basophils Relative: 0.5 % (ref 0.0–3.0)
Eosinophils Absolute: 0.1 10*3/uL (ref 0.0–0.7)
Eosinophils Relative: 2.1 % (ref 0.0–5.0)
HCT: 39.1 % (ref 36.0–46.0)
Hemoglobin: 13.1 g/dL (ref 12.0–15.0)
Lymphocytes Relative: 37.3 % (ref 12.0–46.0)
Lymphs Abs: 1.5 10*3/uL (ref 0.7–4.0)
MCHC: 33.6 g/dL (ref 30.0–36.0)
MCV: 93.6 fL (ref 78.0–100.0)
Monocytes Absolute: 0.4 10*3/uL (ref 0.1–1.0)
Monocytes Relative: 8.8 % (ref 3.0–12.0)
Neutro Abs: 2.1 10*3/uL (ref 1.4–7.7)
Neutrophils Relative %: 51.3 % (ref 43.0–77.0)
Platelets: 189 10*3/uL (ref 150.0–400.0)
RBC: 4.18 Mil/uL (ref 3.87–5.11)
RDW: 14 % (ref 11.5–15.5)
WBC: 4.2 10*3/uL (ref 4.0–10.5)

## 2023-02-07 LAB — TSH: TSH: 0.58 u[IU]/mL (ref 0.35–5.50)

## 2023-02-07 LAB — COMPREHENSIVE METABOLIC PANEL
ALT: 12 U/L (ref 0–35)
AST: 17 U/L (ref 0–37)
Albumin: 4.1 g/dL (ref 3.5–5.2)
Alkaline Phosphatase: 74 U/L (ref 39–117)
BUN: 9 mg/dL (ref 6–23)
CO2: 31 meq/L (ref 19–32)
Calcium: 9.7 mg/dL (ref 8.4–10.5)
Chloride: 103 meq/L (ref 96–112)
Creatinine, Ser: 0.83 mg/dL (ref 0.40–1.20)
GFR: 79.28 mL/min (ref >60.00)
Glucose, Bld: 98 mg/dL (ref 70–99)
Potassium: 4.2 meq/L (ref 3.5–5.1)
Sodium: 141 meq/L (ref 135–145)
Total Bilirubin: 1.2 mg/dL (ref 0.2–1.2)
Total Protein: 7.4 g/dL (ref 6.0–8.3)

## 2023-02-07 LAB — HEMOGLOBIN A1C: Hgb A1c MFr Bld: 6.3 % (ref 4.6–6.5)

## 2023-02-07 NOTE — Assessment & Plan Note (Signed)
Chronic Lab Results  Component Value Date   HGBA1C 6.1 04/04/2022   Check a1c Low sugar / carb diet Stressed regular exercise

## 2023-02-07 NOTE — Assessment & Plan Note (Addendum)
Chronic Intermittent Continue meloxicam 15 mg daily as needed only Continue Flexeril 10 mg nighttime as needed  Encouraged back exercises regularly

## 2023-02-07 NOTE — Assessment & Plan Note (Signed)
Chronic Improved Pain intermittent with getting up, walking Takes tylenol prn Knee replacement if not improving To f/u with ortho prn

## 2023-02-07 NOTE — Assessment & Plan Note (Signed)
Chronic Regular exercise and healthy diet encouraged Check lipid panel , cmp, tsh Continue atorvastatin 10 mg daily

## 2023-02-07 NOTE — Assessment & Plan Note (Signed)
Chronic Cmp, cbc Continue losartan 50 mg daily, propranolol 60 mg daily (prevention for migraines)

## 2023-02-07 NOTE — Assessment & Plan Note (Signed)
Chronic Controlled Continue propranolol 60 mg daily -- can increase to twice daily if migraines are not controlled Excedrin migraine prn - advised not to exceed 2/week

## 2023-02-07 NOTE — Assessment & Plan Note (Signed)
Chronic Encourage weight loss Stressed the importance of regular exercise-currently limited by knee pain Discussed healthy diet, decreased portions Offered nutrition referral

## 2023-02-09 ENCOUNTER — Encounter: Payer: Self-pay | Admitting: Internal Medicine

## 2023-03-21 ENCOUNTER — Encounter: Payer: Self-pay | Admitting: Internal Medicine

## 2023-03-21 ENCOUNTER — Other Ambulatory Visit (HOSPITAL_COMMUNITY): Payer: Self-pay

## 2023-03-21 ENCOUNTER — Ambulatory Visit: Admitting: Internal Medicine

## 2023-03-21 VITALS — BP 102/74 | HR 60 | Temp 98.3°F | Ht 66.0 in | Wt 183.0 lb

## 2023-03-21 DIAGNOSIS — R52 Pain, unspecified: Secondary | ICD-10-CM

## 2023-03-21 DIAGNOSIS — I10 Essential (primary) hypertension: Secondary | ICD-10-CM

## 2023-03-21 DIAGNOSIS — U071 COVID-19: Secondary | ICD-10-CM | POA: Insufficient documentation

## 2023-03-21 DIAGNOSIS — R051 Acute cough: Secondary | ICD-10-CM

## 2023-03-21 LAB — POCT INFLUENZA A/B
Influenza A, POC: NEGATIVE
Influenza B, POC: NEGATIVE

## 2023-03-21 LAB — POC COVID19 BINAXNOW: SARS Coronavirus 2 Ag: POSITIVE — AB

## 2023-03-21 MED ORDER — HYDROCODONE BIT-HOMATROP MBR 5-1.5 MG/5ML PO SOLN
5.0000 mL | Freq: Three times a day (TID) | ORAL | 0 refills | Status: DC | PRN
  Filled 2023-03-21: qty 120, 8d supply, fill #0

## 2023-03-21 NOTE — Patient Instructions (Addendum)
       Medications changes include :   hycodan cough syrup    Take tylenol as needed.  Continue flu and cold medicine.      Return if symptoms worsen or fail to improve.

## 2023-03-21 NOTE — Progress Notes (Signed)
 Subjective:    Patient ID: Mary [name], female    DOB: 03/03/1965, 58 y.o.   MRN: [id number]      HPI Mary [name] is here for  Chief Complaint  Patient presents with   Sore Throat    Started 2/27    Chills    Started Friday along with the other symptoms   Generalized Body Aches   Cough    She is here for an acute visit for cold symptoms.   Her symptoms started last Thursday - 5 days ago.  Today is day 6.  She did test herself at home and COVID was negative.  She is experiencing fatigue, fever/chills, myalgias, cough, wheeze, SOB, headaches and dizziness.    She has tried taking flu and cold medicine, robitussin, cough drops   She is not sleeping well.      Medications and allergies reviewed with patient and updated if appropriate.  Current Outpatient Medications on File Prior to Visit  Medication Sig Dispense Refill   Aspirin-Acetaminophen-Caffeine (EXCEDRIN MIGRAINE PO) Take by mouth as needed.     atorvastatin (LIPITOR) 10 MG tablet Take 1 tablet (10 mg total) by mouth daily. 90 tablet 2   Calcium Carbonate-Vitamin D 600-400 MG-UNIT tablet Take 1 tablet by mouth daily.     losartan (COZAAR) 50 MG tablet Take 1 tablet (50 mg total) by mouth daily. 90 tablet 3   methocarbamol (ROBAXIN) 500 MG tablet Take 1 tablet (500 mg total) by mouth every 8 (eight) hours as needed for muscle tightness 30 tablet 0   Multiple Vitamin (MULTIVITAMIN) tablet Take 1 tablet by mouth daily.     propranolol (INDERAL) 60 MG tablet Take 1 tablet (60 mg total) by mouth 2 (two) times daily. 60 tablet 11   No current facility-administered medications on file prior to visit.    Review of Systems  Constitutional:  Positive for chills, fatigue and fever.  HENT:  Negative for congestion, ear pain, sinus pressure and sore throat.   Respiratory:  Positive for cough (dry initially - a little of bit of phlegm coming up), shortness of breath and wheezing.   Gastrointestinal:  Negative for  diarrhea and nausea.  Musculoskeletal:  Positive for myalgias.  Neurological:  Positive for dizziness (occ) and headaches.       Objective:   Vitals:   03/21/23 1424  BP: 102/74  Pulse: 60  Temp: 98.3 F (36.8 C)  SpO2: 98%   BP Readings from Last 3 Encounters:  03/21/23 102/74  02/07/23 108/76  09/20/22 126/78   Wt Readings from Last 3 Encounters:  03/21/23 183 lb (83 kg)  02/07/23 189 lb (85.7 kg)  09/20/22 197 lb (89.4 kg)   Body mass index is 29.54 kg/m.    Physical Exam Constitutional:      General: She is not in acute distress.    Appearance: Normal appearance. She is not ill-appearing.  HENT:     Head: Normocephalic and atraumatic.     Right Ear: Tympanic membrane, ear canal and external ear normal.     Left Ear: Tympanic membrane, ear canal and external ear normal.     Mouth/Throat:     Mouth: Mucous membranes are moist.     Pharynx: No oropharyngeal exudate or posterior oropharyngeal erythema.  Eyes:     Conjunctiva/sclera: Conjunctivae normal.  Cardiovascular:     Rate and Rhythm: Normal rate and regular rhythm.  Pulmonary:     Effort: Pulmonary effort is normal. No respiratory distress.  Breath sounds: Normal breath sounds. No wheezing or rales.  Musculoskeletal:     Cervical back: Neck supple. No tenderness.  Lymphadenopathy:     Cervical: No cervical adenopathy.  Skin:    General: Skin is warm and dry.  Neurological:     Mental Status: She is alert.         COVID test here is positive.  Test is negative.   Assessment & Plan:    See Problem List for Assessment and Plan of chronic medical problems.

## 2023-03-21 NOTE — Assessment & Plan Note (Signed)
 Chronic Blood pressure is well-controlled Continue losartan 50 mg daily, propranolol 60 mg daily (prevention for migraines)

## 2023-03-21 NOTE — Assessment & Plan Note (Signed)
 Acute Today is day 6 of symptoms COVID test here today is positive Out of the window for Paxlovid and symptoms are mild so okay for symptomatic treatment Hycodan cough syrup sent to pharmacy Can take Mucinex, DayQuil, Tylenol and other over-the-counter cold medications for symptom relief Rest, fluids Recommended discussing help at work when she is able to return to work Call with any questions or concerns

## 2023-05-31 ENCOUNTER — Other Ambulatory Visit: Payer: Self-pay

## 2023-05-31 ENCOUNTER — Other Ambulatory Visit (HOSPITAL_COMMUNITY): Payer: Self-pay

## 2023-05-31 ENCOUNTER — Other Ambulatory Visit: Payer: Self-pay | Admitting: Internal Medicine

## 2023-05-31 MED ORDER — LOSARTAN POTASSIUM 50 MG PO TABS
50.0000 mg | ORAL_TABLET | Freq: Every day | ORAL | 3 refills | Status: DC
  Filled 2023-05-31 – 2023-06-01 (×2): qty 90, 90d supply, fill #0
  Filled 2023-10-07 (×2): qty 90, 90d supply, fill #1
  Filled 2024-01-01: qty 90, 90d supply, fill #2

## 2023-06-01 ENCOUNTER — Other Ambulatory Visit (HOSPITAL_COMMUNITY): Payer: Self-pay

## 2023-06-01 ENCOUNTER — Other Ambulatory Visit: Payer: Self-pay

## 2023-06-23 ENCOUNTER — Encounter (HOSPITAL_BASED_OUTPATIENT_CLINIC_OR_DEPARTMENT_OTHER): Payer: Self-pay | Admitting: Radiology

## 2023-06-23 ENCOUNTER — Ambulatory Visit (HOSPITAL_BASED_OUTPATIENT_CLINIC_OR_DEPARTMENT_OTHER)
Admission: RE | Admit: 2023-06-23 | Discharge: 2023-06-23 | Disposition: A | Discharge status: Home / Self Care | Source: Ambulatory Visit | Attending: Diagnostic Radiology | Admitting: Diagnostic Radiology

## 2023-06-23 DIAGNOSIS — Z1231 Encounter for screening mammogram for malignant neoplasm of breast: Secondary | ICD-10-CM | POA: Diagnosis not present

## 2023-06-26 ENCOUNTER — Other Ambulatory Visit (HOSPITAL_BASED_OUTPATIENT_CLINIC_OR_DEPARTMENT_OTHER): Payer: Self-pay

## 2023-06-26 DIAGNOSIS — Z1231 Encounter for screening mammogram for malignant neoplasm of breast: Secondary | ICD-10-CM

## 2023-07-10 ENCOUNTER — Encounter: Payer: Self-pay | Admitting: Internal Medicine

## 2023-07-11 ENCOUNTER — Other Ambulatory Visit (HOSPITAL_COMMUNITY): Payer: Self-pay

## 2023-07-11 MED ORDER — IBUPROFEN 800 MG PO TABS
800.0000 mg | ORAL_TABLET | Freq: Three times a day (TID) | ORAL | 2 refills | Status: DC
  Filled 2023-07-11: qty 15, 5d supply, fill #0
  Filled 2023-07-18: qty 15, 5d supply, fill #1
  Filled 2023-07-26: qty 15, 5d supply, fill #2

## 2023-07-11 MED ORDER — AMOXICILLIN 875 MG PO TABS
875.0000 mg | ORAL_TABLET | Freq: Two times a day (BID) | ORAL | 0 refills | Status: AC
  Filled 2023-07-11: qty 14, 7d supply, fill #0

## 2023-07-11 MED ORDER — ACETAMINOPHEN-CODEINE 300-30 MG PO TABS
1.0000 | ORAL_TABLET | Freq: Three times a day (TID) | ORAL | 0 refills | Status: DC
  Filled 2023-07-11: qty 9, 3d supply, fill #0

## 2023-07-12 DIAGNOSIS — Z0279 Encounter for issue of other medical certificate: Secondary | ICD-10-CM

## 2023-08-02 ENCOUNTER — Encounter: Payer: Self-pay | Admitting: Internal Medicine

## 2023-08-02 NOTE — Patient Instructions (Addendum)
    Tetanus vaccine given.    Blood work was ordered.        Medications changes include :   None      Return in about 6 months (around 02/07/2024) for Physical Exam.

## 2023-08-02 NOTE — Progress Notes (Addendum)
 Subjective:    Patient ID: Mary [name], female    DOB: 05/26/1965, 58 y.o.   MRN: [id number]     HPI Mary [name] is here for follow up of her chronic medical problems.  Has been losing weight -- she is concerned.  She stopped eating sugar 6 months ago.  She was consuming a lot of sugar in her tea in the morning.  She has also been eating less and more careful about what she is eating.  He also had 2-3 weeks of eating liquid foods only because of dental issues.  Appetite has been decreased, but is improving.  She weighs herself regularly at home.  She walks sometimes for exercise.      Medications and allergies reviewed with patient and updated if appropriate.  Current Outpatient Medications on File Prior to Visit  Medication Sig Dispense Refill   Aspirin-Acetaminophen -Caffeine (EXCEDRIN MIGRAINE PO) Take by mouth as needed.     atorvastatin  (LIPITOR) 10 MG tablet Take 1 tablet (10 mg total) by mouth daily. 90 tablet 2   Calcium  Carbonate-Vitamin D  600-400 MG-UNIT tablet Take 1 tablet by mouth daily.     ibuprofen  (ADVIL ) 800 MG tablet Take 1 tablet (800 mg total) by mouth 3 (three) times daily for 5 days. 15 tablet 2   losartan  (COZAAR ) 50 MG tablet Take 1 tablet (50 mg total) by mouth daily. 90 tablet 3   Multiple Vitamin (MULTIVITAMIN) tablet Take 1 tablet by mouth daily.     propranolol  (INDERAL ) 60 MG tablet Take 1 tablet (60 mg total) by mouth 2 (two) times daily. 60 tablet 11   methocarbamol  (ROBAXIN ) 500 MG tablet Take 1 tablet (500 mg total) by mouth every 8 (eight) hours as needed for muscle tightness 30 tablet 0   No current facility-administered medications on file prior to visit.     Review of Systems  Constitutional:  Positive for unexpected weight change. Negative for appetite change, chills, diaphoresis and fever.  Respiratory:  Negative for cough, shortness of breath and wheezing.   Cardiovascular:  Negative for chest pain, palpitations and leg swelling.   Neurological:  Positive for light-headedness (occ) and headaches (occ - when hungry).       Objective:   Vitals:   08/07/23 0922  BP: 116/78  Pulse: (!) 50  Temp: 97.9 F (36.6 C)  SpO2: 99%   BP Readings from Last 3 Encounters:  08/07/23 116/78  03/21/23 102/74  02/07/23 108/76   Wt Readings from Last 3 Encounters:  08/07/23 168 lb (76.2 kg)  03/21/23 183 lb (83 kg)  02/07/23 189 lb (85.7 kg)   Body mass index is 27.12 kg/m.    Physical Exam Constitutional:      General: She is not in acute distress.    Appearance: Normal appearance.  HENT:     Head: Normocephalic and atraumatic.  Eyes:     Conjunctiva/sclera: Conjunctivae normal.  Cardiovascular:     Rate and Rhythm: Normal rate and regular rhythm.     Heart sounds: Normal heart sounds.  Pulmonary:     Effort: Pulmonary effort is normal. No respiratory distress.     Breath sounds: Normal breath sounds. No wheezing.  Musculoskeletal:     Cervical back: Neck supple.     Right lower leg: No edema.     Left lower leg: No edema.  Lymphadenopathy:     Cervical: No cervical adenopathy.  Skin:    General: Skin is warm and dry.  Findings: No rash.  Neurological:     Mental Status: She is alert. Mental status is at baseline.  Psychiatric:        Mood and Affect: Mood normal.        Behavior: Behavior normal.        Lab Results  Component Value Date   WBC 4.2 02/07/2023   HGB 13.1 02/07/2023   HCT 39.1 02/07/2023   PLT 189.0 02/07/2023   GLUCOSE 98 02/07/2023   CHOL 179 02/07/2023   TRIG 45.0 02/07/2023   HDL 73.40 02/07/2023   LDLDIRECT 127.9 12/05/2011   LDLCALC 97 02/07/2023   ALT 12 02/07/2023   AST 17 02/07/2023   NA 141 02/07/2023   K 4.2 02/07/2023   CL 103 02/07/2023   CREATININE 0.83 02/07/2023   BUN 9 02/07/2023   CO2 31 02/07/2023   TSH 0.58 02/07/2023   HGBA1C 6.3 02/07/2023     Assessment & Plan:   Tdap given today   See Problem List for Assessment and Plan of chronic  medical problems.

## 2023-08-07 ENCOUNTER — Ambulatory Visit: Payer: 59 | Admitting: Internal Medicine

## 2023-08-07 VITALS — BP 116/78 | HR 50 | Temp 97.9°F | Ht 66.0 in | Wt 168.0 lb

## 2023-08-07 DIAGNOSIS — Z23 Encounter for immunization: Secondary | ICD-10-CM

## 2023-08-07 DIAGNOSIS — M25561 Pain in right knee: Secondary | ICD-10-CM

## 2023-08-07 DIAGNOSIS — G43009 Migraine without aura, not intractable, without status migrainosus: Secondary | ICD-10-CM

## 2023-08-07 DIAGNOSIS — E782 Mixed hyperlipidemia: Secondary | ICD-10-CM | POA: Diagnosis not present

## 2023-08-07 DIAGNOSIS — E66811 Obesity, class 1: Secondary | ICD-10-CM | POA: Diagnosis not present

## 2023-08-07 DIAGNOSIS — R7303 Prediabetes: Secondary | ICD-10-CM | POA: Diagnosis not present

## 2023-08-07 DIAGNOSIS — R634 Abnormal weight loss: Secondary | ICD-10-CM | POA: Insufficient documentation

## 2023-08-07 DIAGNOSIS — M545 Low back pain, unspecified: Secondary | ICD-10-CM | POA: Diagnosis not present

## 2023-08-07 DIAGNOSIS — G8929 Other chronic pain: Secondary | ICD-10-CM

## 2023-08-07 DIAGNOSIS — Z6831 Body mass index (BMI) 31.0-31.9, adult: Secondary | ICD-10-CM

## 2023-08-07 DIAGNOSIS — I10 Essential (primary) hypertension: Secondary | ICD-10-CM | POA: Diagnosis not present

## 2023-08-07 DIAGNOSIS — E6609 Other obesity due to excess calories: Secondary | ICD-10-CM | POA: Diagnosis not present

## 2023-08-07 LAB — LIPID PANEL
Cholesterol: 176 mg/dL (ref 0–200)
HDL: 66.1 mg/dL (ref >39.00)
LDL Cholesterol: 101 mg/dL — ABNORMAL HIGH (ref 0–99)
NonHDL: 110
Total CHOL/HDL Ratio: 3
Triglycerides: 47 mg/dL (ref 0.0–149.0)
VLDL: 9.4 mg/dL (ref 0.0–40.0)

## 2023-08-07 LAB — COMPREHENSIVE METABOLIC PANEL WITH GFR
ALT: 13 U/L (ref 0–35)
AST: 18 U/L (ref 0–37)
Albumin: 4.2 g/dL (ref 3.5–5.2)
Alkaline Phosphatase: 71 U/L (ref 39–117)
BUN: 15 mg/dL (ref 6–23)
CO2: 29 meq/L (ref 19–32)
Calcium: 9.8 mg/dL (ref 8.4–10.5)
Chloride: 104 meq/L (ref 96–112)
Creatinine, Ser: 0.72 mg/dL (ref 0.40–1.20)
GFR: 93.7 mL/min (ref >60.00)
Glucose, Bld: 92 mg/dL (ref 70–99)
Potassium: 3.8 meq/L (ref 3.5–5.1)
Sodium: 140 meq/L (ref 135–145)
Total Bilirubin: 1 mg/dL (ref 0.2–1.2)
Total Protein: 7.4 g/dL (ref 6.0–8.3)

## 2023-08-07 LAB — TSH: TSH: 0.28 u[IU]/mL — ABNORMAL LOW (ref 0.35–5.50)

## 2023-08-07 LAB — HEMOGLOBIN A1C: Hgb A1c MFr Bld: 6.2 % (ref 4.6–6.5)

## 2023-08-07 NOTE — Assessment & Plan Note (Signed)
 Chronic Encourage weight loss Stressed the importance of regular exercise-currently limited by knee pain Discussed healthy diet, decreased portions Offered nutrition referral

## 2023-08-07 NOTE — Assessment & Plan Note (Signed)
 Chronic Controlled Continue propranolol 60 mg daily -- can increase to twice daily if migraines are not controlled Excedrin migraine prn - advised not to exceed 2/week

## 2023-08-07 NOTE — Assessment & Plan Note (Addendum)
 New She is concerned about her weight loss In the past 6 months has lost 21 lbs Has decreased sugar intake, decreased portions and had dental issues x 2-3 weeks and was not eating solids-that likely explains the weight loss Check CMP, TSH She will continue to weigh herself at home and monitor her weight-I would expect this to leveled off and if it does not she will follow-up

## 2023-08-07 NOTE — Assessment & Plan Note (Signed)
 Chronic Regular exercise and healthy diet encouraged Check lipid panel, cmp Continue atorvastatin 10 mg daily

## 2023-08-07 NOTE — Assessment & Plan Note (Addendum)
 Chronic Improved with weight loss Pain intermittent with getting up, walking Takes tylenol  prn To f/u with ortho prn

## 2023-08-07 NOTE — Assessment & Plan Note (Signed)
 Chronic Blood pressure is well-controlled Continue losartan 50 mg daily, propranolol 60 mg daily (prevention for migraines)

## 2023-08-07 NOTE — Assessment & Plan Note (Addendum)
 Chronic Lab Results  Component Value Date   HGBA1C 6.3 02/07/2023   Check a1c Low sugar / carb diet-6 months ago she decreased sugar significantly and has resulted in weight loss-continue low sugar diet Stressed regular exercise

## 2023-08-07 NOTE — Addendum Note (Signed)
 Addended by: GEOFM GLADE PARAS on: 08/07/2023 12:25 PM   Modules accepted: Level of Service

## 2023-08-07 NOTE — Assessment & Plan Note (Addendum)
 Chronic Intermittent Continue tylenol  as needed only

## 2023-08-09 ENCOUNTER — Ambulatory Visit: Payer: Self-pay | Admitting: Internal Medicine

## 2023-08-09 DIAGNOSIS — R7989 Other specified abnormal findings of blood chemistry: Secondary | ICD-10-CM

## 2023-08-19 ENCOUNTER — Encounter: Payer: Self-pay | Admitting: Gastroenterology

## 2023-09-04 ENCOUNTER — Ambulatory Visit: Payer: Self-pay | Admitting: Internal Medicine

## 2023-09-04 ENCOUNTER — Other Ambulatory Visit (INDEPENDENT_AMBULATORY_CARE_PROVIDER_SITE_OTHER)

## 2023-09-04 DIAGNOSIS — R7989 Other specified abnormal findings of blood chemistry: Secondary | ICD-10-CM

## 2023-09-04 LAB — T4, FREE: Free T4: 0.81 ng/dL (ref 0.60–1.60)

## 2023-09-04 LAB — TSH: TSH: 0.8 u[IU]/mL (ref 0.35–5.50)

## 2023-10-07 ENCOUNTER — Other Ambulatory Visit (HOSPITAL_COMMUNITY): Payer: Self-pay

## 2023-10-07 ENCOUNTER — Other Ambulatory Visit: Payer: Self-pay | Admitting: Internal Medicine

## 2023-10-09 ENCOUNTER — Other Ambulatory Visit (HOSPITAL_COMMUNITY): Payer: Self-pay

## 2023-10-09 MED ORDER — ATORVASTATIN CALCIUM 10 MG PO TABS
10.0000 mg | ORAL_TABLET | Freq: Every day | ORAL | 2 refills | Status: DC
  Filled 2023-10-09: qty 90, 90d supply, fill #0
  Filled 2024-01-01: qty 90, 90d supply, fill #1

## 2023-10-10 ENCOUNTER — Other Ambulatory Visit (HOSPITAL_COMMUNITY): Payer: Self-pay

## 2023-11-03 ENCOUNTER — Other Ambulatory Visit (HOSPITAL_COMMUNITY): Payer: Self-pay

## 2023-11-03 MED ORDER — COVID-19 MRNA VAC-TRIS(PFIZER) 30 MCG/0.3ML IM SUSY
0.3000 mL | PREFILLED_SYRINGE | Freq: Once | INTRAMUSCULAR | 0 refills | Status: AC
  Filled 2023-11-03: qty 0.3, 1d supply, fill #0

## 2023-12-12 ENCOUNTER — Other Ambulatory Visit (HOSPITAL_COMMUNITY): Payer: Self-pay

## 2023-12-15 ENCOUNTER — Other Ambulatory Visit (HOSPITAL_COMMUNITY): Payer: Self-pay

## 2024-02-04 ENCOUNTER — Encounter: Payer: Self-pay | Admitting: Internal Medicine

## 2024-02-04 NOTE — Patient Instructions (Addendum)
" ° °  Call GI to schedule colonoscopy - Phone: [phone]    Blood work was ordered.       Medications changes include :   None     Return in about 6 months (around 08/06/2024) for Physical Exam. "

## 2024-02-04 NOTE — Progress Notes (Unsigned)
 "   Subjective:    Patient ID: Mary [name], female    DOB: 1965/07/21, 59 y.o.   MRN: [id number]      HPI Dorlis is here for follow-up of her chronic medical problems.      Medications and allergies reviewed with patient and updated if appropriate.  Medications Ordered Prior to Encounter[1]  Review of Systems  Constitutional:  Negative for fever.  Respiratory:  Negative for cough, shortness of breath and wheezing.   Cardiovascular:  Negative for chest pain, palpitations and leg swelling.  Neurological:  Negative for light-headedness and headaches.       Objective:  There were no vitals filed for this visit. There were no vitals filed for this visit. There is no height or weight on file to calculate BMI.  BP Readings from Last 3 Encounters:  08/07/23 116/78  03/21/23 102/74  02/07/23 108/76    Wt Readings from Last 3 Encounters:  08/07/23 168 lb (76.2 kg)  03/21/23 183 lb (83 kg)  02/07/23 189 lb (85.7 kg)       Physical Exam Constitutional:      General: She is not in acute distress.    Appearance: Normal appearance.  HENT:     Head: Normocephalic and atraumatic.  Eyes:     Conjunctiva/sclera: Conjunctivae normal.  Cardiovascular:     Rate and Rhythm: Normal rate and regular rhythm.     Heart sounds: Normal heart sounds.  Pulmonary:     Effort: Pulmonary effort is normal. No respiratory distress.     Breath sounds: Normal breath sounds. No wheezing.  Musculoskeletal:     Cervical back: Neck supple.     Right lower leg: No edema.     Left lower leg: No edema.  Lymphadenopathy:     Cervical: No cervical adenopathy.  Skin:    General: Skin is warm and dry.     Findings: No rash.  Neurological:     Mental Status: She is alert. Mental status is at baseline.  Psychiatric:        Mood and Affect: Mood normal.        Behavior: Behavior normal.        Lab Results  Component Value Date   WBC 4.2 02/07/2023   HGB 13.1 02/07/2023   HCT  39.1 02/07/2023   PLT 189.0 02/07/2023   GLUCOSE 92 08/07/2023   CHOL 176 08/07/2023   TRIG 47.0 08/07/2023   HDL 66.10 08/07/2023   LDLDIRECT 127.9 12/05/2011   LDLCALC 101 (H) 08/07/2023   ALT 13 08/07/2023   AST 18 08/07/2023   NA 140 08/07/2023   K 3.8 08/07/2023   CL 104 08/07/2023   CREATININE 0.72 08/07/2023   BUN 15 08/07/2023   CO2 29 08/07/2023   TSH 0.80 09/04/2023   HGBA1C 6.2 08/07/2023         Assessment & Plan:     See Problem List for Assessment and Plan of chronic medical problems.        [1]  Current Outpatient Medications on File Prior to Visit  Medication Sig Dispense Refill   Aspirin-Acetaminophen -Caffeine (EXCEDRIN MIGRAINE PO) Take by mouth as needed.     atorvastatin  (LIPITOR) 10 MG tablet Take 1 tablet (10 mg total) by mouth daily. 90 tablet 2   Calcium  Carbonate-Vitamin D  600-400 MG-UNIT tablet Take 1 tablet by mouth daily.     losartan  (COZAAR ) 50 MG tablet Take 1 tablet (50 mg total) by mouth daily. 90 tablet 3  Multiple Vitamin (MULTIVITAMIN) tablet Take 1 tablet by mouth daily.     propranolol  (INDERAL ) 60 MG tablet Take 1 tablet (60 mg total) by mouth 2 (two) times daily. 60 tablet 11   No current facility-administered medications on file prior to visit.   "

## 2024-02-07 ENCOUNTER — Other Ambulatory Visit (HOSPITAL_COMMUNITY): Payer: Self-pay

## 2024-02-07 ENCOUNTER — Other Ambulatory Visit: Payer: Self-pay

## 2024-02-07 ENCOUNTER — Ambulatory Visit: Admitting: Internal Medicine

## 2024-02-07 VITALS — BP 120/80 | HR 51 | Temp 98.1°F | Ht 66.0 in | Wt 169.0 lb

## 2024-02-07 DIAGNOSIS — Z0000 Encounter for general adult medical examination without abnormal findings: Secondary | ICD-10-CM

## 2024-02-07 DIAGNOSIS — I10 Essential (primary) hypertension: Secondary | ICD-10-CM | POA: Diagnosis not present

## 2024-02-07 DIAGNOSIS — E7800 Pure hypercholesterolemia, unspecified: Secondary | ICD-10-CM

## 2024-02-07 DIAGNOSIS — G8929 Other chronic pain: Secondary | ICD-10-CM | POA: Diagnosis not present

## 2024-02-07 DIAGNOSIS — M25561 Pain in right knee: Secondary | ICD-10-CM

## 2024-02-07 DIAGNOSIS — R7303 Prediabetes: Secondary | ICD-10-CM | POA: Diagnosis not present

## 2024-02-07 DIAGNOSIS — M545 Low back pain, unspecified: Secondary | ICD-10-CM | POA: Diagnosis not present

## 2024-02-07 LAB — COMPREHENSIVE METABOLIC PANEL WITH GFR
ALT: 13 U/L (ref 3–35)
AST: 16 U/L (ref 5–37)
Albumin: 4.1 g/dL (ref 3.5–5.2)
Alkaline Phosphatase: 70 U/L (ref 39–117)
BUN: 11 mg/dL (ref 6–23)
CO2: 32 meq/L (ref 19–32)
Calcium: 9.7 mg/dL (ref 8.4–10.5)
Chloride: 105 meq/L (ref 96–112)
Creatinine, Ser: 0.75 mg/dL (ref 0.40–1.20)
GFR: 88.9 mL/min
Glucose, Bld: 86 mg/dL (ref 70–99)
Potassium: 4 meq/L (ref 3.5–5.1)
Sodium: 141 meq/L (ref 135–145)
Total Bilirubin: 1.3 mg/dL — ABNORMAL HIGH (ref 0.2–1.2)
Total Protein: 7.5 g/dL (ref 6.0–8.3)

## 2024-02-07 LAB — CBC
HCT: 39.1 % (ref 36.0–46.0)
Hemoglobin: 13.1 g/dL (ref 12.0–15.0)
MCHC: 33.5 g/dL (ref 30.0–36.0)
MCV: 91.5 fl (ref 78.0–100.0)
Platelets: 165 K/uL (ref 150.0–400.0)
RBC: 4.27 Mil/uL (ref 3.87–5.11)
RDW: 14.1 % (ref 11.5–15.5)
WBC: 3.5 K/uL — ABNORMAL LOW (ref 4.0–10.5)

## 2024-02-07 LAB — LIPID PANEL
Cholesterol: 171 mg/dL (ref 28–200)
HDL: 76.8 mg/dL
LDL Cholesterol: 85 mg/dL (ref 10–99)
NonHDL: 93.93
Total CHOL/HDL Ratio: 2
Triglycerides: 43 mg/dL (ref 10.0–149.0)
VLDL: 8.6 mg/dL (ref 0.0–40.0)

## 2024-02-07 LAB — TSH: TSH: 0.43 u[IU]/mL (ref 0.35–5.50)

## 2024-02-07 LAB — HEMOGLOBIN A1C: Hgb A1c MFr Bld: 6 % (ref 4.6–6.5)

## 2024-02-07 MED ORDER — ATORVASTATIN CALCIUM 10 MG PO TABS
10.0000 mg | ORAL_TABLET | Freq: Every day | ORAL | 2 refills | Status: AC
  Filled 2024-02-07: qty 90, 90d supply, fill #0

## 2024-02-07 MED ORDER — PROPRANOLOL HCL 60 MG PO TABS
60.0000 mg | ORAL_TABLET | Freq: Two times a day (BID) | ORAL | 1 refills | Status: AC
  Filled 2024-02-07: qty 180, 90d supply, fill #0

## 2024-02-07 MED ORDER — LOSARTAN POTASSIUM 50 MG PO TABS
50.0000 mg | ORAL_TABLET | Freq: Every day | ORAL | 3 refills | Status: AC
  Filled 2024-02-07: qty 90, 90d supply, fill #0

## 2024-02-07 NOTE — Assessment & Plan Note (Signed)
 Chronic Blood pressure is well-controlled Continue losartan 50 mg daily, propranolol 60 mg daily (prevention for migraines)

## 2024-02-07 NOTE — Assessment & Plan Note (Signed)
 Chronic Lab Results  Component Value Date   HGBA1C 6.0 02/07/2024   Check a1c Low sugar / carb diet Stressed regular exercise

## 2024-02-07 NOTE — Assessment & Plan Note (Signed)
 Chronic Intermittent Has seen sports medicine in the past Encouraged back exercises, core strengthening Continue tylenol  as needed only

## 2024-02-07 NOTE — Assessment & Plan Note (Signed)
 Chronic Improved with weight loss Pain intermittent with getting up, walking-moderate in nature Has seen sports medicine-injections have not helped, tried Celebrex  and diclofenac  without improvement Pain has gotten worse Continue Tylenol  as needed, ice, heat She may need to consider knee replacement when this is no longer tolerable Will renew FMLA-2 times a month for 2-3 days

## 2024-02-07 NOTE — Assessment & Plan Note (Signed)
 Chronic Regular exercise and healthy diet encouraged Check lipid panel, cmp Continue atorvastatin 10 mg daily

## 2024-02-07 NOTE — Progress Notes (Signed)
 "     Subjective:    Patient ID: Mary [name], female    DOB: October 25, 1965, 59 y.o.   MRN: [id number]     HPI Geniyah is here for follow up of her chronic medical problems.  L knee OA - moderate.  Injections stopped working and celebrex  and diclofenac  did not help.  He was advised that she may need to have a knee replacement.  Chronic lower back stiffness.  Pain is tolerable.  She does use heat and does stretches.   Medications and allergies reviewed with patient and updated if appropriate.  Medications Ordered Prior to Encounter[1]   Review of Systems  Constitutional:  Negative for fever.  Respiratory:  Positive for wheezing (once in a while). Negative for cough and shortness of breath.   Cardiovascular:  Positive for leg swelling (left leg - likely from knee OA). Negative for chest pain and palpitations.  Musculoskeletal:  Positive for arthralgias (left knee) and back pain (lower back stiffness).  Neurological:  Positive for headaches (occ). Negative for light-headedness.       Objective:   Vitals:   02/07/24 0934  BP: 120/80  Pulse: (!) 51  Temp: 98.1 F (36.7 C)  SpO2: 97%   BP Readings from Last 3 Encounters:  02/07/24 120/80  08/07/23 116/78  03/21/23 102/74   Wt Readings from Last 3 Encounters:  02/07/24 169 lb (76.7 kg)  08/07/23 168 lb (76.2 kg)  03/21/23 183 lb (83 kg)   Body mass index is 27.28 kg/m.    Physical Exam Constitutional:      General: She is not in acute distress.    Appearance: Normal appearance.  HENT:     Head: Normocephalic and atraumatic.  Eyes:     Conjunctiva/sclera: Conjunctivae normal.  Cardiovascular:     Rate and Rhythm: Normal rate and regular rhythm.     Heart sounds: Normal heart sounds.  Pulmonary:     Effort: Pulmonary effort is normal. No respiratory distress.     Breath sounds: Normal breath sounds. No wheezing.  Musculoskeletal:     Cervical back: Neck supple.     Right lower leg: No edema.     Left  lower leg: No edema.  Lymphadenopathy:     Cervical: No cervical adenopathy.  Skin:    General: Skin is warm and dry.     Findings: No rash.  Neurological:     Mental Status: She is alert. Mental status is at baseline.  Psychiatric:        Mood and Affect: Mood normal.        Behavior: Behavior normal.        Lab Results  Component Value Date   WBC 4.2 02/07/2023   HGB 13.1 02/07/2023   HCT 39.1 02/07/2023   PLT 189.0 02/07/2023   GLUCOSE 92 08/07/2023   CHOL 176 08/07/2023   TRIG 47.0 08/07/2023   HDL 66.10 08/07/2023   LDLDIRECT 127.9 12/05/2011   LDLCALC 101 (H) 08/07/2023   ALT 13 08/07/2023   AST 18 08/07/2023   NA 140 08/07/2023   K 3.8 08/07/2023   CL 104 08/07/2023   CREATININE 0.72 08/07/2023   BUN 15 08/07/2023   CO2 29 08/07/2023   TSH 0.80 09/04/2023   HGBA1C 6.2 08/07/2023     Assessment & Plan:    See Problem List for Assessment and Plan of chronic medical problems.       [1]  Current Outpatient Medications on File Prior to Visit  Medication Sig Dispense Refill   Aspirin-Acetaminophen -Caffeine (EXCEDRIN MIGRAINE PO) Take by mouth as needed.     atorvastatin  (LIPITOR) 10 MG tablet Take 1 tablet (10 mg total) by mouth daily. 90 tablet 2   Calcium  Carbonate-Vitamin D  600-400 MG-UNIT tablet Take 1 tablet by mouth daily.     losartan  (COZAAR ) 50 MG tablet Take 1 tablet (50 mg total) by mouth daily. 90 tablet 3   Multiple Vitamin (MULTIVITAMIN) tablet Take 1 tablet by mouth daily.     propranolol  (INDERAL ) 60 MG tablet Take 1 tablet (60 mg total) by mouth 2 (two) times daily. 60 tablet 11   No current facility-administered medications on file prior to visit.   "

## 2024-02-08 ENCOUNTER — Other Ambulatory Visit (HOSPITAL_COMMUNITY): Payer: Self-pay

## 2024-02-08 ENCOUNTER — Encounter: Payer: Self-pay | Admitting: Gastroenterology

## 2024-02-08 ENCOUNTER — Ambulatory Visit: Payer: Self-pay | Admitting: Internal Medicine

## 2024-02-21 ENCOUNTER — Encounter: Payer: Self-pay | Admitting: Internal Medicine

## 2024-03-05 ENCOUNTER — Ambulatory Visit: Admitting: Gastroenterology
